# Patient Record
Sex: Male | Born: 1985 | Race: Black or African American | Hispanic: No | Marital: Single | Smoking: Current every day smoker
Health system: Southern US, Community
[De-identification: ages and names within clinical notes are randomized; demographics above are authoritative.]

## PROBLEM LIST (undated history)

## (undated) ENCOUNTER — Emergency Department (HOSPITAL_COMMUNITY): Admission: EM | Disposition: A | Discharge status: Home / Self Care | Payer: Medicaid Other

## (undated) DIAGNOSIS — F419 Anxiety disorder, unspecified: Secondary | ICD-10-CM

## (undated) DIAGNOSIS — K219 Gastro-esophageal reflux disease without esophagitis: Secondary | ICD-10-CM

## (undated) DIAGNOSIS — F319 Bipolar disorder, unspecified: Secondary | ICD-10-CM

## (undated) DIAGNOSIS — F23 Brief psychotic disorder: Secondary | ICD-10-CM

## (undated) DIAGNOSIS — R569 Unspecified convulsions: Secondary | ICD-10-CM

## (undated) HISTORY — PX: ABDOMINAL SURGERY: SHX537

---

## 2012-06-23 ENCOUNTER — Emergency Department: Payer: Self-pay | Admitting: Emergency Medicine

## 2012-06-23 LAB — CBC
MCHC: 31.7 g/dL — ABNORMAL LOW (ref 32.0–36.0)
Platelet: 242 10*3/uL (ref 150–440)
RBC: 4.42 10*6/uL (ref 4.40–5.90)
RDW: 12.3 % (ref 11.5–14.5)

## 2012-06-23 LAB — DRUG SCREEN, URINE
Amphetamines, Ur Screen: NEGATIVE (ref ?–1000)
Benzodiazepine, Ur Scrn: NEGATIVE (ref ?–200)
Cannabinoid 50 Ng, Ur ~~LOC~~: NEGATIVE (ref ?–50)
MDMA (Ecstasy)Ur Screen: NEGATIVE (ref ?–500)
Methadone, Ur Screen: NEGATIVE (ref ?–300)
Opiate, Ur Screen: NEGATIVE (ref ?–300)
Phencyclidine (PCP) Ur S: NEGATIVE (ref ?–25)

## 2012-06-23 LAB — COMPREHENSIVE METABOLIC PANEL
Alkaline Phosphatase: 61 U/L (ref 50–136)
BUN: 9 mg/dL (ref 7–18)
Chloride: 113 mmol/L — ABNORMAL HIGH (ref 98–107)
Co2: 30 mmol/L (ref 21–32)
Creatinine: 0.87 mg/dL (ref 0.60–1.30)
EGFR (African American): >60
Glucose: 88 mg/dL (ref 65–99)
Osmolality: 292 (ref 275–301)
Sodium: 148 mmol/L — ABNORMAL HIGH (ref 136–145)

## 2012-06-23 LAB — URINALYSIS, COMPLETE
Blood: NEGATIVE
Glucose,UR: NEGATIVE mg/dL (ref 0–75)
Ketone: NEGATIVE
Leukocyte Esterase: NEGATIVE
Ph: 8 (ref 4.5–8.0)
Protein: NEGATIVE
RBC,UR: 1 /HPF (ref 0–5)
Squamous Epithelial: NONE SEEN
WBC UR: 1 /HPF (ref 0–5)

## 2012-06-23 LAB — TSH: Thyroid Stimulating Horm: 3.48 u[IU]/mL

## 2012-06-23 LAB — ETHANOL
Ethanol %: <0.003 % (ref 0.000–0.080)
Ethanol: <3 mg/dL

## 2012-06-25 ENCOUNTER — Emergency Department: Payer: Self-pay

## 2012-06-25 LAB — VALPROIC ACID LEVEL: Valproic Acid: 55 ug/mL

## 2012-06-25 LAB — URINALYSIS, COMPLETE
Bilirubin,UR: NEGATIVE
Blood: NEGATIVE
Glucose,UR: NEGATIVE mg/dL (ref 0–75)
Leukocyte Esterase: NEGATIVE
Nitrite: NEGATIVE
Ph: 6 (ref 4.5–8.0)
Protein: NEGATIVE
Specific Gravity: 1.02 (ref 1.003–1.030)

## 2012-06-25 LAB — COMPREHENSIVE METABOLIC PANEL
Albumin: 3.8 g/dL (ref 3.4–5.0)
Alkaline Phosphatase: 60 U/L (ref 50–136)
Anion Gap: 5 — ABNORMAL LOW (ref 7–16)
BUN: 9 mg/dL (ref 7–18)
Calcium, Total: 8.5 mg/dL (ref 8.5–10.1)
Chloride: 113 mmol/L — ABNORMAL HIGH (ref 98–107)
Co2: 30 mmol/L (ref 21–32)
Creatinine: 0.84 mg/dL (ref 0.60–1.30)
EGFR (Non-African Amer.): >60
Glucose: 83 mg/dL (ref 65–99)
Osmolality: 292 (ref 275–301)
Potassium: 3.7 mmol/L (ref 3.5–5.1)
SGOT(AST): 27 U/L (ref 15–37)
SGPT (ALT): 27 U/L (ref 12–78)
Total Protein: 7.3 g/dL (ref 6.4–8.2)

## 2012-06-25 LAB — DRUG SCREEN, URINE
Benzodiazepine, Ur Scrn: NEGATIVE (ref ?–200)
Cannabinoid 50 Ng, Ur ~~LOC~~: NEGATIVE (ref ?–50)
Cocaine Metabolite,Ur ~~LOC~~: NEGATIVE (ref ?–300)
MDMA (Ecstasy)Ur Screen: NEGATIVE (ref ?–500)
Methadone, Ur Screen: NEGATIVE (ref ?–300)
Opiate, Ur Screen: NEGATIVE (ref ?–300)
Phencyclidine (PCP) Ur S: NEGATIVE (ref ?–25)

## 2012-06-25 LAB — CBC
HGB: 13.5 g/dL (ref 13.0–18.0)
MCH: 29.8 pg (ref 26.0–34.0)
MCV: 92 fL (ref 80–100)
Platelet: 254 10*3/uL (ref 150–440)
RDW: 12.5 % (ref 11.5–14.5)
WBC: 7.8 10*3/uL (ref 3.8–10.6)

## 2012-06-25 LAB — ETHANOL: Ethanol %: <0.003 % (ref 0.000–0.080)

## 2012-06-25 LAB — TSH: Thyroid Stimulating Horm: 7.21 u[IU]/mL — ABNORMAL HIGH

## 2012-07-06 LAB — CBC
HCT: 42.2 % (ref 40.0–52.0)
HGB: 13.6 g/dL (ref 13.0–18.0)
MCH: 29.4 pg (ref 26.0–34.0)
MCHC: 32.2 g/dL (ref 32.0–36.0)
MCV: 91 fL (ref 80–100)
Platelet: 245 10*3/uL (ref 150–440)
RDW: 12.6 % (ref 11.5–14.5)

## 2012-07-06 LAB — DRUG SCREEN, URINE
Amphetamines, Ur Screen: NEGATIVE (ref ?–1000)
Benzodiazepine, Ur Scrn: NEGATIVE (ref ?–200)
Cocaine Metabolite,Ur ~~LOC~~: NEGATIVE (ref ?–300)
Methadone, Ur Screen: NEGATIVE (ref ?–300)
Opiate, Ur Screen: NEGATIVE (ref ?–300)
Tricyclic, Ur Screen: NEGATIVE (ref ?–1000)

## 2012-07-06 LAB — COMPREHENSIVE METABOLIC PANEL
Albumin: 3.6 g/dL (ref 3.4–5.0)
Alkaline Phosphatase: 63 U/L (ref 50–136)
Anion Gap: 4 — ABNORMAL LOW (ref 7–16)
BUN: 7 mg/dL (ref 7–18)
Calcium, Total: 9 mg/dL (ref 8.5–10.1)
Chloride: 111 mmol/L — ABNORMAL HIGH (ref 98–107)
Co2: 29 mmol/L (ref 21–32)
Creatinine: 0.85 mg/dL (ref 0.60–1.30)
EGFR (African American): >60
EGFR (Non-African Amer.): >60
Glucose: 101 mg/dL — ABNORMAL HIGH (ref 65–99)
Potassium: 3.5 mmol/L (ref 3.5–5.1)
SGOT(AST): 22 U/L (ref 15–37)
SGPT (ALT): 24 U/L (ref 12–78)
Sodium: 144 mmol/L (ref 136–145)
Total Protein: 7.7 g/dL (ref 6.4–8.2)

## 2012-07-06 LAB — URINALYSIS, COMPLETE
Bacteria: NONE SEEN
Blood: NEGATIVE
Glucose,UR: NEGATIVE mg/dL (ref 0–75)
Ketone: NEGATIVE
Leukocyte Esterase: NEGATIVE
Ph: 7 (ref 4.5–8.0)
Protein: NEGATIVE
RBC,UR: <1 /HPF (ref 0–5)
WBC UR: 1 /HPF (ref 0–5)

## 2012-07-06 LAB — ETHANOL: Ethanol: <3 mg/dL

## 2012-07-06 LAB — TSH: Thyroid Stimulating Horm: 5.28 u[IU]/mL — ABNORMAL HIGH

## 2012-07-09 ENCOUNTER — Inpatient Hospital Stay: Payer: Self-pay | Admitting: Psychiatry

## 2012-07-09 LAB — VALPROIC ACID LEVEL: Valproic Acid: 80 ug/mL

## 2012-07-09 LAB — LITHIUM LEVEL: Lithium: 0.99 mmol/L

## 2012-07-31 ENCOUNTER — Emergency Department: Payer: Self-pay | Admitting: Emergency Medicine

## 2012-07-31 LAB — COMPREHENSIVE METABOLIC PANEL
Alkaline Phosphatase: 55 U/L (ref 50–136)
Anion Gap: 7 (ref 7–16)
BUN: 13 mg/dL (ref 7–18)
Calcium, Total: 8.7 mg/dL (ref 8.5–10.1)
Chloride: 111 mmol/L — ABNORMAL HIGH (ref 98–107)
Creatinine: 0.92 mg/dL (ref 0.60–1.30)
EGFR (Non-African Amer.): >60
Glucose: 87 mg/dL (ref 65–99)
Osmolality: 290 (ref 275–301)
Potassium: 3.4 mmol/L — ABNORMAL LOW (ref 3.5–5.1)
SGOT(AST): 23 U/L (ref 15–37)
Total Protein: 7.3 g/dL (ref 6.4–8.2)

## 2012-07-31 LAB — SALICYLATE LEVEL: Salicylates, Serum: <1.7 mg/dL

## 2012-07-31 LAB — CBC
Platelet: 207 10*3/uL (ref 150–440)
RDW: 12.8 % (ref 11.5–14.5)
WBC: 6.2 10*3/uL (ref 3.8–10.6)

## 2012-07-31 LAB — DRUG SCREEN, URINE
Barbiturates, Ur Screen: NEGATIVE (ref ?–200)
Cannabinoid 50 Ng, Ur ~~LOC~~: NEGATIVE (ref ?–50)
Methadone, Ur Screen: NEGATIVE (ref ?–300)
Opiate, Ur Screen: NEGATIVE (ref ?–300)

## 2012-07-31 LAB — URINALYSIS, COMPLETE
Bacteria: NONE SEEN
Bilirubin,UR: NEGATIVE
Blood: NEGATIVE
Glucose,UR: NEGATIVE mg/dL (ref 0–75)
Nitrite: NEGATIVE
Specific Gravity: 1.02 (ref 1.003–1.030)

## 2012-07-31 LAB — ETHANOL
Ethanol %: <0.003 % (ref 0.000–0.080)
Ethanol: <3 mg/dL

## 2012-07-31 LAB — ACETAMINOPHEN LEVEL: Acetaminophen: <2 ug/mL

## 2012-08-03 LAB — BASIC METABOLIC PANEL
Anion Gap: 6 — ABNORMAL LOW (ref 7–16)
BUN: 9 mg/dL (ref 7–18)
Chloride: 107 mmol/L (ref 98–107)
Co2: 29 mmol/L (ref 21–32)
EGFR (Non-African Amer.): >60
Glucose: 82 mg/dL (ref 65–99)
Potassium: 3.8 mmol/L (ref 3.5–5.1)
Sodium: 142 mmol/L (ref 136–145)

## 2012-08-03 LAB — TSH: Thyroid Stimulating Horm: 4.02 u[IU]/mL

## 2012-08-13 LAB — LITHIUM LEVEL: Lithium: 0.78 mmol/L

## 2012-08-13 LAB — VALPROIC ACID LEVEL: Valproic Acid: 85 ug/mL

## 2012-11-17 ENCOUNTER — Emergency Department (HOSPITAL_COMMUNITY)
Admission: EM | Admit: 2012-11-17 | Discharge: 2012-11-18 | Disposition: A | Discharge status: Home / Self Care | Payer: 59 | Attending: Emergency Medicine | Admitting: Emergency Medicine

## 2012-11-17 ENCOUNTER — Encounter (HOSPITAL_COMMUNITY): Payer: Self-pay | Admitting: *Deleted

## 2012-11-17 DIAGNOSIS — F319 Bipolar disorder, unspecified: Secondary | ICD-10-CM | POA: Insufficient documentation

## 2012-11-17 DIAGNOSIS — F17200 Nicotine dependence, unspecified, uncomplicated: Secondary | ICD-10-CM | POA: Insufficient documentation

## 2012-11-17 DIAGNOSIS — Z79899 Other long term (current) drug therapy: Secondary | ICD-10-CM | POA: Insufficient documentation

## 2012-11-17 DIAGNOSIS — F209 Schizophrenia, unspecified: Secondary | ICD-10-CM | POA: Insufficient documentation

## 2012-11-17 HISTORY — DX: Bipolar disorder, unspecified: F31.9

## 2012-11-17 HISTORY — DX: Brief psychotic disorder: F23

## 2012-11-17 LAB — SALICYLATE LEVEL: Salicylate Lvl: <2 mg/dL — ABNORMAL LOW (ref 2.8–20.0)

## 2012-11-17 LAB — COMPREHENSIVE METABOLIC PANEL
ALT: 13 U/L (ref 0–53)
AST: 21 U/L (ref 0–37)
Albumin: 3.7 g/dL (ref 3.5–5.2)
Alkaline Phosphatase: 48 U/L (ref 39–117)
CO2: 25 mEq/L (ref 19–32)
Chloride: 107 mEq/L (ref 96–112)
GFR calc non Af Amer: >90 mL/min (ref >90)
Potassium: 3.4 mEq/L — ABNORMAL LOW (ref 3.5–5.1)
Sodium: 142 mEq/L (ref 135–145)
Total Bilirubin: 0.2 mg/dL — ABNORMAL LOW (ref 0.3–1.2)

## 2012-11-17 LAB — RAPID URINE DRUG SCREEN, HOSP PERFORMED
Amphetamines: NOT DETECTED
Barbiturates: NOT DETECTED
Benzodiazepines: NOT DETECTED
Tetrahydrocannabinol: NOT DETECTED

## 2012-11-17 LAB — ACETAMINOPHEN LEVEL: Acetaminophen (Tylenol), Serum: <15 ug/mL (ref 10–30)

## 2012-11-17 LAB — CBC
Platelets: 202 10*3/uL (ref 150–400)
RBC: 4.19 MIL/uL — ABNORMAL LOW (ref 4.22–5.81)
RDW: 13.2 % (ref 11.5–15.5)
WBC: 6.4 10*3/uL (ref 4.0–10.5)

## 2012-11-17 MED ORDER — LITHIUM CARBONATE 300 MG PO CAPS
600.0000 mg | ORAL_CAPSULE | Freq: Two times a day (BID) | ORAL | Status: DC
  Administered 2012-11-18: 600 mg via ORAL
  Filled 2012-11-17: qty 2

## 2012-11-17 MED ORDER — LORAZEPAM 1 MG PO TABS
2.0000 mg | ORAL_TABLET | Freq: Four times a day (QID) | ORAL | Status: DC | PRN
  Administered 2012-11-17: 2 mg via ORAL
  Filled 2012-11-17: qty 2

## 2012-11-17 MED ORDER — OLANZAPINE 5 MG PO TABS
10.0000 mg | ORAL_TABLET | Freq: Every morning | ORAL | Status: DC
  Administered 2012-11-18: 10 mg via ORAL
  Filled 2012-11-17: qty 2

## 2012-11-17 MED ORDER — DIVALPROEX SODIUM 500 MG PO DR TAB
1000.0000 mg | DELAYED_RELEASE_TABLET | Freq: Two times a day (BID) | ORAL | Status: DC
  Administered 2012-11-17 – 2012-11-18 (×2): 1000 mg via ORAL
  Filled 2012-11-17: qty 2
  Filled 2012-11-17: qty 4

## 2012-11-17 MED ORDER — CLONAZEPAM 1 MG PO TABS: 1.0000 mg | ORAL_TABLET | Freq: Two times a day (BID) | ORAL | Status: DC | PRN

## 2012-11-17 MED ORDER — LEVOTHYROXINE SODIUM 25 MCG PO TABS
25.0000 ug | ORAL_TABLET | Freq: Every day | ORAL | Status: DC
  Administered 2012-11-18: 25 ug via ORAL
  Filled 2012-11-17 (×2): qty 1

## 2012-11-17 MED ORDER — BENZTROPINE MESYLATE 1 MG PO TABS
2.0000 mg | ORAL_TABLET | Freq: Two times a day (BID) | ORAL | Status: DC
  Administered 2012-11-17 – 2012-11-18 (×2): 2 mg via ORAL
  Filled 2012-11-17 (×2): qty 2

## 2012-11-17 MED ORDER — OLANZAPINE 5 MG PO TABS
20.0000 mg | ORAL_TABLET | Freq: Every day | ORAL | Status: DC
  Administered 2012-11-17: 20 mg via ORAL
  Filled 2012-11-17: qty 4

## 2012-11-17 NOTE — ED Notes (Signed)
2330  Pt is released by tele-psych and is not wanted to come back to the group home as the other people there do not want him there as they are afraid of him.  Spoke to the counselor and he said he will try and come and get him but it will be a while.  Pt is his own legal guardian and has the right to be released on his own merritt.  Pt is requesting his clothes so he can go outside and smoke.  Pt states he will wait for Brett Canales in lobby.  Pt is very determined to leave and we have no reason to hold him any longer as he states he will not stay here tonight and be picked up in the morning.
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
## 2012-11-17 NOTE — ED Provider Notes (Addendum)
CSN: [id number]     Arrival date & time 11/17/12  1959 History     First MD Initiated Contact with Patient 11/17/12 2106     Chief Complaint  Patient presents with  . Medical Clearance   (Consider location/radiation/quality/duration/timing/severity/associated sxs/prior Treatment) HPI Comments: Comes to the ER for evaluation of hallucinations. Patient has a history of schizophrenia. He reports that he has been hearing increased command hallucinations. Hallucinations are telling him to kill people and to kill himself. Patient is concerned that he might harm himself or others. He has been hospitalized for this before.   Past Medical History  Diagnosis Date  . Bipolar 1 disorder   . Schizophrenia, acute    History reviewed. No pertinent past surgical history. History reviewed. No pertinent family history. History  Substance Use Topics  . Smoking status: Not on file  . Smokeless tobacco: Not on file  . Alcohol Use: Not on file    Review of Systems  Psychiatric/Behavioral: Positive for hallucinations and dysphoric mood.  All other systems reviewed and are negative.    Allergies  Benadryl  Home Medications   Current Outpatient Rx  Name  Route  Sig  Dispense  Refill  . acetaminophen (TYLENOL) 650 MG CR tablet   Oral   Take 650 mg by mouth every 6 (six) hours as needed for pain.         . benztropine (COGENTIN) 2 MG tablet   Oral   Take 2 mg by mouth 2 (two) times daily.         . clonazePAM (KLONOPIN) 1 MG tablet   Oral   Take 1 mg by mouth 2 (two) times daily as needed for anxiety.         . divalproex (DEPAKOTE) 500 MG DR tablet   Oral   Take 1,000 mg by mouth 2 (two) times daily.         [name] levothyroxine (SYNTHROID, LEVOTHROID) 25 MCG tablet   Oral   Take 25 mcg by mouth daily before breakfast.         . lithium 600 MG capsule   Oral   Take 600 mg by mouth 2 (two) times daily with a meal.         . LORazepam (ATIVAN) 2 MG tablet   Oral   Take 2  mg by mouth every 6 (six) hours as needed for anxiety.         [name] OLANZapine (ZYPREXA) 10 MG tablet   Oral   Take 10 mg by mouth every morning.         [name] OLANZapine (ZYPREXA) 20 MG tablet   Oral   Take 20 mg by mouth at bedtime.         [name] OLANZapine (ZYPREXA) 5 MG tablet   Oral   Take 5 mg by mouth every 6 (six) hours as needed (for bipolar disorder).          BP 148/74  Pulse 96  Temp(Src) 98 F (36.7 C) (Oral)  Resp 20  SpO2 100% Physical Exam  Constitutional: He is oriented to person, place, and time. He appears well-developed and well-nourished. No distress.  HENT:  Head: Normocephalic and atraumatic.  Right Ear: Hearing normal.  Left Ear: Hearing normal.  Nose: Nose normal.  Mouth/Throat: Oropharynx is clear and moist and mucous membranes are normal.  Eyes: Conjunctivae and EOM are normal. Pupils are equal, round, and reactive to light.  Neck: Normal range of motion. Neck supple.  Cardiovascular: Regular rhythm, S1 normal and S2 normal.  Exam reveals no gallop and no friction rub.   No murmur heard. Pulmonary/Chest: Effort normal and breath sounds normal. No respiratory distress. He exhibits no tenderness.  Abdominal: Soft. Normal appearance and bowel sounds are normal. There is no hepatosplenomegaly. There is no tenderness. There is no rebound, no guarding, no tenderness at McBurney's point and negative Murphy's sign. No hernia.  Musculoskeletal: Normal range of motion.  Neurological: He is alert and oriented to person, place, and time. He has normal strength. No cranial nerve deficit or sensory deficit. Coordination normal. GCS eye subscore is 4. GCS verbal subscore is 5. GCS motor subscore is 6.  Skin: Skin is warm, dry and intact. No rash noted. No cyanosis.  Psychiatric: His speech is delayed. He is slowed. He exhibits a depressed mood. He expresses homicidal and suicidal ideation.    ED Course   Procedures (including critical care time)  Labs Reviewed   CBC - Abnormal; Notable for the following:    RBC 4.19 (*)    Hemoglobin 12.5 (*)    All other components within normal limits  COMPREHENSIVE METABOLIC PANEL - Abnormal; Notable for the following:    Potassium 3.4 (*)    Glucose, Bld 108 (*)    Total Bilirubin 0.2 (*)    All other components within normal limits  SALICYLATE LEVEL - Abnormal; Notable for the following:    Salicylate Lvl <2.0 (*)    All other components within normal limits  ACETAMINOPHEN LEVEL  [name]  URINE RAPID DRUG SCREEN (HOSP PERFORMED)   No results found.  Diagnosis: 1. Psychosis 2. Depression  MDM  Presents to the ER for evaluation of increased hallucinations. Patient is currently living in a group home, having difficulty in his living environment. He is taking his medications. He has a history of bipolar as well as schizophrenia. Bloodwork was unremarkable. Patient is medically clear for psychiatric evaluation and treatment.  Addendum: Has been evaluated by Dr. Jacky Kindle, psychiatry. Although he is hallucinating, he is not felt to be a threat and has been cleared for discharge and outpatient followup.  Gilda Crease, MD 11/17/12 2112  Gilda Crease, MD 11/17/12 [phone]

## 2012-11-18 MED ORDER — LORAZEPAM 1 MG PO TABS: 1.0000 mg | ORAL_TABLET | Freq: Three times a day (TID) | ORAL | Status: DC | PRN

## 2012-11-18 NOTE — ED Notes (Signed)
Had accident in hall w/diarrhea. In BR taking shower and changing scrubs
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
## 2012-11-18 NOTE — ED Notes (Signed)
Pt to be transported via Security with a Psychologist, sport and exercise to [employer]. Pt changed into paper scrub top and his own sweatpants r/t scrub pants not fitting. Pt has been wanded by security while wearing sweatpants.
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
## 2012-11-18 NOTE — BH Assessment (Signed)
[facility] Assessment Progress Note   Received call from patient's guardian Brett Canales #[phone]. Says that he is ready to pick patient up "if he is ready to be discharged". Writer explained that per report received this morning patient has been appropriate for discharge since yesterday 11/17/2012. Brett Canales sts that he will be here to pick patient up asap and he is in route to the ER.

## 2012-11-18 NOTE — Progress Notes (Signed)
Pt without pcp No medicaid card scanned in EPIC Cm attempted to call the [facility] [phone] (PER GOGGLE) number to find out who pcp might be but the number is disconnected Cm called Enoch,Steven Legal Guardian [phone] but unable to allow CM to leave a voice message (mail box not set up)
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
## 2012-11-18 NOTE — ED Provider Notes (Signed)
Act team indicates pts group home staff coming to pick up patient to return there.  Pt content, alert, nad, vitals normal, stable for d/c back to group home.    Suzi Roots, MD 11/18/12 [phone]

## 2012-11-18 NOTE — ED Notes (Signed)
Dr. Opitz at bedside. 
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
## 2012-11-18 NOTE — ED Notes (Signed)
Security Paged for transport

## 2012-11-19 ENCOUNTER — Emergency Department (HOSPITAL_COMMUNITY)
Admission: EM | Admit: 2012-11-19 | Discharge: 2012-11-20 | Disposition: A | Discharge status: Home / Self Care | Payer: Medicaid Other | Attending: Emergency Medicine | Admitting: Emergency Medicine

## 2012-11-19 DIAGNOSIS — F319 Bipolar disorder, unspecified: Secondary | ICD-10-CM | POA: Insufficient documentation

## 2012-11-19 DIAGNOSIS — R45851 Suicidal ideations: Secondary | ICD-10-CM | POA: Insufficient documentation

## 2012-11-19 DIAGNOSIS — R443 Hallucinations, unspecified: Secondary | ICD-10-CM | POA: Insufficient documentation

## 2012-11-19 DIAGNOSIS — F3289 Other specified depressive episodes: Secondary | ICD-10-CM | POA: Insufficient documentation

## 2012-11-19 DIAGNOSIS — Z79899 Other long term (current) drug therapy: Secondary | ICD-10-CM | POA: Insufficient documentation

## 2012-11-19 DIAGNOSIS — F2089 Other schizophrenia: Secondary | ICD-10-CM | POA: Insufficient documentation

## 2012-11-19 DIAGNOSIS — F329 Major depressive disorder, single episode, unspecified: Secondary | ICD-10-CM | POA: Insufficient documentation

## 2012-11-19 DIAGNOSIS — F250 Schizoaffective disorder, bipolar type: Secondary | ICD-10-CM | POA: Diagnosis present

## 2012-11-19 DIAGNOSIS — R569 Unspecified convulsions: Secondary | ICD-10-CM | POA: Insufficient documentation

## 2012-11-19 DIAGNOSIS — F39 Unspecified mood [affective] disorder: Secondary | ICD-10-CM | POA: Insufficient documentation

## 2012-11-20 ENCOUNTER — Encounter (HOSPITAL_COMMUNITY): Payer: Self-pay

## 2012-11-20 ENCOUNTER — Emergency Department: Payer: Self-pay | Admitting: Emergency Medicine

## 2012-11-20 DIAGNOSIS — F259 Schizoaffective disorder, unspecified: Secondary | ICD-10-CM

## 2012-11-20 DIAGNOSIS — F250 Schizoaffective disorder, bipolar type: Secondary | ICD-10-CM | POA: Diagnosis present

## 2012-11-20 DIAGNOSIS — F29 Unspecified psychosis not due to a substance or known physiological condition: Secondary | ICD-10-CM

## 2012-11-20 LAB — DRUG SCREEN, URINE
Amphetamines, Ur Screen: NEGATIVE (ref ?–1000)
Barbiturates, Ur Screen: NEGATIVE (ref ?–200)
Cannabinoid 50 Ng, Ur ~~LOC~~: NEGATIVE (ref ?–50)
Cocaine Metabolite,Ur ~~LOC~~: NEGATIVE (ref ?–300)
MDMA (Ecstasy)Ur Screen: NEGATIVE (ref ?–500)
Phencyclidine (PCP) Ur S: NEGATIVE (ref ?–25)

## 2012-11-20 LAB — COMPREHENSIVE METABOLIC PANEL
Albumin: 3.4 g/dL — ABNORMAL LOW (ref 3.5–5.2)
Albumin: 3.6 g/dL (ref 3.4–5.0)
Anion Gap: 2 — ABNORMAL LOW (ref 7–16)
BUN: 8 mg/dL (ref 6–23)
BUN: 8 mg/dL (ref 7–18)
CO2: 29 mEq/L (ref 19–32)
Chloride: 105 mEq/L (ref 96–112)
Chloride: 109 mmol/L — ABNORMAL HIGH (ref 98–107)
Co2: 32 mmol/L (ref 21–32)
Creatinine, Ser: 0.88 mg/dL (ref 0.50–1.35)
Creatinine: 0.89 mg/dL (ref 0.60–1.30)
EGFR (African American): >60
GFR calc Af Amer: >90 mL/min (ref >90)
GFR calc non Af Amer: >90 mL/min (ref >90)
Glucose, Bld: 110 mg/dL — ABNORMAL HIGH (ref 70–99)
Glucose: 83 mg/dL (ref 65–99)
Osmolality: 282 (ref 275–301)
Potassium: 3.8 mmol/L (ref 3.5–5.1)
SGOT(AST): 25 U/L (ref 15–37)
Sodium: 143 mmol/L (ref 136–145)
Total Bilirubin: 0.3 mg/dL (ref 0.3–1.2)
Total Protein: 7.3 g/dL (ref 6.4–8.2)

## 2012-11-20 LAB — LITHIUM LEVEL: Lithium Lvl: 1.26 mEq/L (ref 0.80–1.40)

## 2012-11-20 LAB — CBC
HCT: 37.1 % — ABNORMAL LOW (ref 39.0–52.0)
HGB: 13.6 g/dL (ref 13.0–18.0)
Hemoglobin: 11.9 g/dL — ABNORMAL LOW (ref 13.0–17.0)
MCV: 94.2 fL (ref 78.0–100.0)
MCV: 91 fL (ref 80–100)
Platelet: 192 10*3/uL (ref 150–440)
RBC: 4.52 10*6/uL (ref 4.40–5.90)
RDW: 12.9 % (ref 11.5–15.5)
RDW: 13.3 % (ref 11.5–14.5)
WBC: 7.9 10*3/uL (ref 4.0–10.5)
WBC: 6.4 10*3/uL (ref 3.8–10.6)

## 2012-11-20 LAB — URINALYSIS, COMPLETE
Ketone: NEGATIVE
Leukocyte Esterase: NEGATIVE
Nitrite: NEGATIVE
Ph: 9 (ref 4.5–8.0)
Protein: NEGATIVE
Squamous Epithelial: NONE SEEN
WBC UR: <1 /HPF (ref 0–5)

## 2012-11-20 LAB — ETHANOL
Alcohol, Ethyl (B): <11 mg/dL (ref 0–11)
Ethanol %: <0.003 % (ref 0.000–0.080)
Ethanol: <3 mg/dL

## 2012-11-20 LAB — TSH: Thyroid Stimulating Horm: 3.77 u[IU]/mL

## 2012-11-20 LAB — RAPID URINE DRUG SCREEN, HOSP PERFORMED: Opiates: NOT DETECTED

## 2012-11-20 MED ORDER — BENZTROPINE MESYLATE 1 MG PO TABS
2.0000 mg | ORAL_TABLET | Freq: Two times a day (BID) | ORAL | Status: DC
  Administered 2012-11-20: 2 mg via ORAL
  Filled 2012-11-20: qty 2

## 2012-11-20 MED ORDER — OLANZAPINE 5 MG PO TABS
10.0000 mg | ORAL_TABLET | Freq: Every morning | ORAL | Status: DC
  Administered 2012-11-20: 10 mg via ORAL
  Filled 2012-11-20: qty 2

## 2012-11-20 MED ORDER — OLANZAPINE 5 MG PO TABS: 20.0000 mg | ORAL_TABLET | Freq: Every day | ORAL | Status: DC

## 2012-11-20 MED ORDER — DIVALPROEX SODIUM 500 MG PO DR TAB
1000.0000 mg | DELAYED_RELEASE_TABLET | Freq: Two times a day (BID) | ORAL | Status: DC
  Administered 2012-11-20: 1000 mg via ORAL
  Filled 2012-11-20: qty 2

## 2012-11-20 MED ORDER — LITHIUM CARBONATE 300 MG PO CAPS
600.0000 mg | ORAL_CAPSULE | Freq: Two times a day (BID) | ORAL | Status: DC
  Administered 2012-11-20: 600 mg via ORAL
  Filled 2012-11-20: qty 2

## 2012-11-20 MED ORDER — POTASSIUM CHLORIDE CRYS ER 20 MEQ PO TBCR
40.0000 meq | EXTENDED_RELEASE_TABLET | Freq: Once | ORAL | Status: AC
  Administered 2012-11-20: 40 meq via ORAL
  Filled 2012-11-20: qty 2

## 2012-11-20 MED ORDER — OLANZAPINE 5 MG PO TABS: 5.0000 mg | ORAL_TABLET | Freq: Four times a day (QID) | ORAL | Status: DC | PRN

## 2012-11-20 MED ORDER — LEVOTHYROXINE SODIUM 25 MCG PO TABS
25.0000 ug | ORAL_TABLET | Freq: Every day | ORAL | Status: DC
  Administered 2012-11-20: 25 ug via ORAL
  Filled 2012-11-20 (×2): qty 1

## 2012-11-20 MED ORDER — IBUPROFEN 200 MG PO TABS: 600.0000 mg | ORAL_TABLET | Freq: Three times a day (TID) | ORAL | Status: DC | PRN

## 2012-11-20 MED ORDER — CLONAZEPAM 1 MG PO TABS
1.0000 mg | ORAL_TABLET | Freq: Two times a day (BID) | ORAL | Status: DC | PRN
Start: 2012-11-20 — End: 2012-11-20

## 2012-11-20 MED ORDER — ZOLPIDEM TARTRATE 5 MG PO TABS: 5.0000 mg | ORAL_TABLET | Freq: Every evening | ORAL | Status: DC | PRN

## 2012-11-20 MED ORDER — ONDANSETRON HCL 4 MG PO TABS: 4.0000 mg | ORAL_TABLET | Freq: Three times a day (TID) | ORAL | Status: DC | PRN

## 2012-11-20 MED ORDER — LORAZEPAM 1 MG PO TABS
2.0000 mg | ORAL_TABLET | Freq: Four times a day (QID) | ORAL | Status: DC | PRN
Start: 2012-11-20 — End: 2012-11-20

## 2012-11-20 MED ORDER — NICOTINE 21 MG/24HR TD PT24
21.0000 mg | MEDICATED_PATCH | Freq: Every day | TRANSDERMAL | Status: DC
  Filled 2012-11-20: qty 1

## 2012-11-20 NOTE — Progress Notes (Signed)
CSW spoke with Brett Canales at [facility] to pick up patient. Patient caregiver, anticipated patient will arrive to provide transport around 11:30 am  .[name], Theresia Majors  [phone] .11/20/2012 10:19am

## 2012-11-20 NOTE — ED Provider Notes (Signed)
Medical screening examination/treatment/procedure(s) were performed by non-physician practitioner and as supervising physician I was immediately available for consultation/collaboration.  Sunnie Nielsen, MD 11/20/12 [phone]
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
## 2012-11-20 NOTE — ED Provider Notes (Signed)
Psych PA evaluated patient and thought that he is stable to go back to group home. Group home will accept him back.   Richardean Canal, MD 11/20/12 1034

## 2012-11-20 NOTE — ED Notes (Signed)
Pt was here Monday night from [facility] to hold for placement, pt comes tonight from [place] and they state that he is not allowed back at [facility]

## 2012-11-20 NOTE — Consult Note (Signed)
Reviewed  Note and agree  With conclusions and disposition

## 2012-11-20 NOTE — [facility] Counselor (Signed)
Per Dr. Ladona Ridgel pt is psychiatrically cleared. Disposition to be completed by SW & pt to return back to group home

## 2012-11-20 NOTE — Consult Note (Signed)
Reason for Consult: Psychosis Referring Physician: Dr. Carrington Clamp [name] is an 27 y.o. male.  HPI: Patient eating breakfast, polite, engages easily in conversation.  He is calm and cooperative, denies suicidal/homicidal ideations and auditory/visual hallucinations.  [name] requests to go back to his group home and attend a program he is scheduled to go to today.  He is taking his medications and Lithium and Depakote levels are within the normal range.    Past Medical History  Diagnosis Date  . Bipolar 1 disorder   . Schizophrenia, acute     History reviewed. No pertinent past surgical history.  History reviewed. No pertinent family history.  Social History:  has no tobacco, alcohol, and drug history on file.  Allergies:  Allergies  Allergen Reactions  . Benadryl (Diphenhydramine Hcl) Anaphylaxis    Medications: I have reviewed the patient's current medications.  Results for orders placed during the hospital encounter of 11/19/12 (from the past 48 hour(s))  CBC     Status: Abnormal   Collection Time    11/20/12  1:00 AM      Result Value Range   WBC 7.9  4.0 - 10.5 K/uL   RBC 3.94 (*) 4.22 - 5.81 MIL/uL   Hemoglobin 11.9 (*) 13.0 - 17.0 g/dL   HCT 16.1 (*) 09.6 - 04.5 %   MCV 94.2  78.0 - 100.0 fL   MCH 30.2  26.0 - 34.0 pg   MCHC 32.1  30.0 - 36.0 g/dL   RDW 40.9  81.1 - 91.4 %   Platelets 207  150 - 400 K/uL  COMPREHENSIVE METABOLIC PANEL     Status: Abnormal   Collection Time    11/20/12  1:00 AM      Result Value Range   Sodium 142  135 - 145 mEq/L   Potassium 3.1 (*) 3.5 - 5.1 mEq/L   Chloride 105  96 - 112 mEq/L   CO2 29  19 - 32 mEq/L   Glucose, Bld 110 (*) 70 - 99 mg/dL   BUN 8  6 - 23 mg/dL   Creatinine, Ser 7.82  0.50 - 1.35 mg/dL   Calcium 9.2  8.4 - 95.6 mg/dL   Total Protein 6.3  6.0 - 8.3 g/dL   Albumin 3.4 (*) 3.5 - 5.2 g/dL   AST 20  0 - 37 U/L   ALT 10  0 - 53 U/L   Alkaline Phosphatase 44  39 - 117 U/L   Total Bilirubin 0.3  0.3 - 1.2  mg/dL   GFR calc non Af Amer >90  >90 mL/min   GFR calc Af Amer >90  >90 mL/min   Comment:            The eGFR has been calculated     using the CKD EPI equation.     This calculation has not been     validated in all clinical     situations.     eGFR's persistently     <90 mL/min signify     possible Chronic Kidney Disease.  [name]     Status: None   Collection Time    11/20/12  1:00 AM      Result Value Range   Alcohol, Ethyl (B) <11  0 - 11 mg/dL   Comment:            LOWEST DETECTABLE LIMIT FOR     SERUM ALCOHOL IS 11 mg/dL     FOR MEDICAL PURPOSES ONLY  LITHIUM LEVEL     Status: None   Collection Time    11/20/12  1:00 AM      Result Value Range   Lithium Lvl 1.26  0.80 - 1.40 mEq/L  URINE RAPID DRUG SCREEN (HOSP PERFORMED)     Status: None   Collection Time    11/20/12  1:13 AM      Result Value Range   Opiates NONE DETECTED  NONE DETECTED   Cocaine NONE DETECTED  NONE DETECTED   Benzodiazepines NONE DETECTED  NONE DETECTED   Amphetamines NONE DETECTED  NONE DETECTED   Tetrahydrocannabinol NONE DETECTED  NONE DETECTED   Barbiturates NONE DETECTED  NONE DETECTED   Comment:            DRUG SCREEN FOR MEDICAL PURPOSES     ONLY.  IF CONFIRMATION IS NEEDED     FOR ANY PURPOSE, NOTIFY LAB     WITHIN 5 DAYS.                LOWEST DETECTABLE LIMITS     FOR URINE DRUG SCREEN     Drug Class       Cutoff (ng/mL)     Amphetamine      1000     Barbiturate      200     Benzodiazepine   200     Tricyclics       300     Opiates          300     Cocaine          300     THC              50    No results found.  Review of Systems  Constitutional: Negative.   HENT: Negative.   Eyes: Negative.   Respiratory: Negative.   Cardiovascular: Negative.   Gastrointestinal: Negative.   Genitourinary: Negative.   Musculoskeletal: Negative.   Skin: Negative.   Neurological: Negative.   Endo/Heme/Allergies: Negative.   Psychiatric/Behavioral: Negative.    Blood pressure  138/74, pulse 71, temperature 97.6 F (36.4 C), temperature source Oral, resp. rate 20, SpO2 97.00%. Physical Exam Completed by primary MD, reviewed  Family History:  Mother may have mental issues, type not noted.   Mental Status Examination/Evaluation: Patient is dressed appropriately, appears younger than his stated age, good eye contact, alert and oriented times three.  He is calm and cooperative today, denies any active or passive suicidal thoughts or homicidal thoughts.  Mood is euthymic and affect congruent.  His thoughts are organized and answers questions appropriately, goal directed.  There is no paranoia or delusions present at this time.  He denies any auditory or visual hallucination.  His attention and concentration are fair.  His insight and judgment are fair, impulse control intact.  DIAGNOSIS:  AXIS I  Schizoaffective disorder, bipolar type; psychosis  AXIS II  Deferred   AXIS III  None  AXIS IV  other psychosocial or environmental problems, chronic mental illness, problems with primary support group   AXIS V  61-70 mild symptoms    Assessment/Plan:  Recommend continue current psychiatric medications. Patient does not need inpatient psychiatric treatment. Recommend followup treatment with his regular provider and return to his group home  Nanine Means, PMH-NP 11/20/2012, 10:11 AM

## 2012-11-20 NOTE — ED Notes (Addendum)
Pt is awake and alert, pt is hyper-verbal but cooperative. Pt was medication compliant.  Pt is discharge back to the group home via Brett Canales (group home staff)  Discharge and escorted to lobby without incident.saftely monitor and maintain.

## 2012-11-20 NOTE — ED Provider Notes (Signed)
CSN: [id number]     Arrival date & time 11/19/12  2355 History     First MD Initiated Contact with Patient 11/20/12 0025     Chief Complaint  Patient presents with  . Medical Clearance   HPI  History provided by the patient. Patient is a 27 year old male who presents after being seen at [facility] for medical clearance. Patient states that he has continued to have auditory hallucinations and states that he has felt suicidal. He denies any specific plan. He was seen in the [facility] emergency room recently for similar symptoms. It was decided that he could return home to his group home. Patient has not had any improvement however in reports increased depression. He has not been taking his medications as instructed over the past few days. Denies any other drug or alcohol use. No other aggravating or alleviating factors. Pt also reports that he had 3 seizures today while outside.  He reports that his body was shaking.  He states he thinks this was from his medications and the heat.  No prior history of seizures. Denies any LOC. Denies any chest.    Past Medical History  Diagnosis Date  . Bipolar 1 disorder   . Schizophrenia, acute    History reviewed. No pertinent past surgical history. History reviewed. No pertinent family history. History  Substance Use Topics  . Smoking status: Not on file  . Smokeless tobacco: Not on file  . Alcohol Use: Not on file    Review of Systems  Constitutional: Negative for fever and chills.  Psychiatric/Behavioral: Positive for suicidal ideas, hallucinations and dysphoric mood.  All other systems reviewed and are negative.    Allergies  Benadryl  Home Medications   Current Outpatient Rx  Name  Route  Sig  Dispense  Refill  . acetaminophen (TYLENOL) 650 MG CR tablet   Oral   Take 650 mg by mouth every 6 (six) hours as needed for pain.         . benztropine (COGENTIN) 2 MG tablet   Oral   Take 2 mg by mouth 2 (two) times daily.         .  clonazePAM (KLONOPIN) 1 MG tablet   Oral   Take 1 mg by mouth 2 (two) times daily as needed for anxiety.         . divalproex (DEPAKOTE) 500 MG DR tablet   Oral   Take 1,000 mg by mouth 2 (two) times daily.         [name] levothyroxine (SYNTHROID, LEVOTHROID) 25 MCG tablet   Oral   Take 25 mcg by mouth daily before breakfast.         . lithium 600 MG capsule   Oral   Take 600 mg by mouth 2 (two) times daily with a meal.         . LORazepam (ATIVAN) 2 MG tablet   Oral   Take 2 mg by mouth every 6 (six) hours as needed for anxiety.         [name] OLANZapine (ZYPREXA) 10 MG tablet   Oral   Take 10 mg by mouth every morning.         [name] OLANZapine (ZYPREXA) 20 MG tablet   Oral   Take 20 mg by mouth at bedtime.         [name] OLANZapine (ZYPREXA) 5 MG tablet   Oral   Take 5 mg by mouth every 6 (six) hours as needed (for bipolar disorder).  BP 115/64  Pulse 81  Temp(Src) 98.4 F (36.9 C) (Oral)  Resp 20  SpO2 98% Physical Exam  Nursing note and vitals reviewed. Constitutional: He is oriented to person, place, and time. He appears well-developed and well-nourished. No distress.  HENT:  Head: Normocephalic.  Cardiovascular: Normal rate and regular rhythm.   Pulmonary/Chest: Effort normal and breath sounds normal. No respiratory distress. He has no wheezes. He has no rales.  Abdominal: Soft.  Neurological: He is alert and oriented to person, place, and time.  Skin: Skin is warm. No rash noted. No erythema.  Psychiatric: He has a normal mood and affect. He is not actively hallucinating. He expresses suicidal ideation. He expresses no homicidal ideation. He expresses no suicidal plans.    ED Course   Procedures   Results for orders placed during the hospital encounter of 11/19/12  CBC      Result Value Range   WBC 7.9  4.0 - 10.5 K/uL   RBC 3.94 (*) 4.22 - 5.81 MIL/uL   Hemoglobin 11.9 (*) 13.0 - 17.0 g/dL   HCT 16.1 (*) 09.6 - 04.5 %   MCV 94.2  78.0 -  100.0 fL   MCH 30.2  26.0 - 34.0 pg   MCHC 32.1  30.0 - 36.0 g/dL   RDW 40.9  81.1 - 91.4 %   Platelets 207  150 - 400 K/uL  COMPREHENSIVE METABOLIC PANEL      Result Value Range   Sodium 142  135 - 145 mEq/L   Potassium 3.1 (*) 3.5 - 5.1 mEq/L   Chloride 105  96 - 112 mEq/L   CO2 29  19 - 32 mEq/L   Glucose, Bld 110 (*) 70 - 99 mg/dL   BUN 8  6 - 23 mg/dL   Creatinine, Ser 7.82  0.50 - 1.35 mg/dL   Calcium 9.2  8.4 - 95.6 mg/dL   Total Protein 6.3  6.0 - 8.3 g/dL   Albumin 3.4 (*) 3.5 - 5.2 g/dL   AST 20  0 - 37 U/L   ALT 10  0 - 53 U/L   Alkaline Phosphatase 44  39 - 117 U/L   Total Bilirubin 0.3  0.3 - 1.2 mg/dL   GFR calc non Af Amer >90  >90 mL/min   GFR calc Af Amer >90  >90 mL/min  [name]      Result Value Range   Alcohol, Ethyl (B) <11  0 - 11 mg/dL  URINE RAPID DRUG SCREEN (HOSP PERFORMED)      Result Value Range   Opiates NONE DETECTED  NONE DETECTED   Cocaine NONE DETECTED  NONE DETECTED   Benzodiazepines NONE DETECTED  NONE DETECTED   Amphetamines NONE DETECTED  NONE DETECTED   Tetrahydrocannabinol NONE DETECTED  NONE DETECTED   Barbiturates NONE DETECTED  NONE DETECTED  LITHIUM LEVEL      Result Value Range   Lithium Lvl 1.26  0.80 - 1.40 mEq/L      1. Hallucination   2. Suicidal ideation     MDM  Patient seen and evaluated. Patient continued to endorse suicidal ideations. Patient also reports hallucinations.  Patient moved to Psych ED. Holding orders in place. Patient to be evaluated for placement by act team.     Angus Seller, PA-C 11/20/12 [phone]

## 2012-11-20 NOTE — BHH Counselor (Signed)
Pt initially presented to [facility] voluntarily.  Pt came from group home, denies SI/HI, positive for AH. Per [place] from [place], pt is unable to come back to [place] b/c of history of seizures.

## 2012-11-21 ENCOUNTER — Encounter (HOSPITAL_COMMUNITY): Payer: Self-pay | Admitting: *Deleted

## 2012-11-21 ENCOUNTER — Emergency Department (HOSPITAL_COMMUNITY)
Admission: EM | Admit: 2012-11-21 | Discharge: 2012-11-22 | Disposition: A | Discharge status: Home / Self Care | Payer: Medicaid Other | Attending: Emergency Medicine | Admitting: Emergency Medicine

## 2012-11-21 DIAGNOSIS — R1013 Epigastric pain: Secondary | ICD-10-CM | POA: Insufficient documentation

## 2012-11-21 DIAGNOSIS — Z008 Encounter for other general examination: Secondary | ICD-10-CM | POA: Insufficient documentation

## 2012-11-21 DIAGNOSIS — R070 Pain in throat: Secondary | ICD-10-CM | POA: Insufficient documentation

## 2012-11-21 DIAGNOSIS — F319 Bipolar disorder, unspecified: Secondary | ICD-10-CM | POA: Insufficient documentation

## 2012-11-21 DIAGNOSIS — R1110 Vomiting, unspecified: Secondary | ICD-10-CM | POA: Insufficient documentation

## 2012-11-21 DIAGNOSIS — Z79899 Other long term (current) drug therapy: Secondary | ICD-10-CM | POA: Insufficient documentation

## 2012-11-21 DIAGNOSIS — F2089 Other schizophrenia: Secondary | ICD-10-CM | POA: Insufficient documentation

## 2012-11-21 NOTE — ED Notes (Signed)
Per EMS[place]. Initial complaint was abd pain following a large meal of "hot wings and greasy foods". Patient requested to go to mental health. Patient behaving erratically with EMS and in waiting room at this time.

## 2012-11-21 NOTE — ED Notes (Signed)
Patient is alert and oriented x3.  [place] [address] with complaints of abdominal pain after eating  Hot wings.  He also states that he needs help with his psych medications and request BH help.

## 2012-11-22 ENCOUNTER — Encounter (HOSPITAL_COMMUNITY): Payer: Self-pay | Admitting: Emergency Medicine

## 2012-11-22 ENCOUNTER — Emergency Department (HOSPITAL_COMMUNITY)
Admission: EM | Admit: 2012-11-22 | Discharge: 2012-11-23 | Disposition: A | Discharge status: Home / Self Care | Payer: Medicaid Other | Attending: Emergency Medicine | Admitting: Emergency Medicine

## 2012-11-22 DIAGNOSIS — R45850 Homicidal ideations: Secondary | ICD-10-CM

## 2012-11-22 DIAGNOSIS — F2089 Other schizophrenia: Secondary | ICD-10-CM | POA: Insufficient documentation

## 2012-11-22 DIAGNOSIS — F200 Paranoid schizophrenia: Secondary | ICD-10-CM

## 2012-11-22 DIAGNOSIS — R45851 Suicidal ideations: Secondary | ICD-10-CM | POA: Insufficient documentation

## 2012-11-22 DIAGNOSIS — Z79899 Other long term (current) drug therapy: Secondary | ICD-10-CM | POA: Insufficient documentation

## 2012-11-22 DIAGNOSIS — R440 Auditory hallucinations: Secondary | ICD-10-CM

## 2012-11-22 DIAGNOSIS — F17200 Nicotine dependence, unspecified, uncomplicated: Secondary | ICD-10-CM | POA: Insufficient documentation

## 2012-11-22 DIAGNOSIS — F319 Bipolar disorder, unspecified: Secondary | ICD-10-CM | POA: Insufficient documentation

## 2012-11-22 DIAGNOSIS — R443 Hallucinations, unspecified: Secondary | ICD-10-CM | POA: Insufficient documentation

## 2012-11-22 LAB — CBC WITH DIFFERENTIAL/PLATELET
Eosinophils Absolute: 0.2 10*3/uL (ref 0.0–0.7)
Hemoglobin: 12.7 g/dL — ABNORMAL LOW (ref 13.0–17.0)
Lymphocytes Relative: 36 % (ref 12–46)
Lymphs Abs: 2.7 10*3/uL (ref 0.7–4.0)
MCH: 30 pg (ref 26.0–34.0)
Monocytes Relative: 16 % — ABNORMAL HIGH (ref 3–12)
Neutrophils Relative %: 46 % (ref 43–77)
RBC: 4.23 MIL/uL (ref 4.22–5.81)
WBC: 7.4 10*3/uL (ref 4.0–10.5)

## 2012-11-22 LAB — ETHANOL: Alcohol, Ethyl (B): <11 mg/dL (ref 0–11)

## 2012-11-22 LAB — BASIC METABOLIC PANEL
BUN: 6 mg/dL (ref 6–23)
CO2: 31 mEq/L (ref 19–32)
Chloride: 106 mEq/L (ref 96–112)
GFR calc non Af Amer: >90 mL/min (ref >90)
Glucose, Bld: 91 mg/dL (ref 70–99)
Potassium: 3.5 mEq/L (ref 3.5–5.1)
Sodium: 143 mEq/L (ref 135–145)

## 2012-11-22 LAB — RAPID URINE DRUG SCREEN, HOSP PERFORMED
Amphetamines: NOT DETECTED
Barbiturates: NOT DETECTED
Benzodiazepines: NOT DETECTED
Cocaine: NOT DETECTED
Tetrahydrocannabinol: NOT DETECTED

## 2012-11-22 MED ORDER — ONDANSETRON HCL 4 MG PO TABS: 4.0000 mg | ORAL_TABLET | Freq: Three times a day (TID) | ORAL | Status: DC | PRN

## 2012-11-22 MED ORDER — ACETAMINOPHEN 325 MG PO TABS: 650.0000 mg | ORAL_TABLET | ORAL | Status: DC | PRN

## 2012-11-22 MED ORDER — LITHIUM CARBONATE 300 MG PO CAPS
600.0000 mg | ORAL_CAPSULE | Freq: Two times a day (BID) | ORAL | Status: DC
  Administered 2012-11-22 – 2012-11-23 (×4): 600 mg via ORAL
  Filled 2012-11-22 (×4): qty 2

## 2012-11-22 MED ORDER — BENZTROPINE MESYLATE 1 MG PO TABS
2.0000 mg | ORAL_TABLET | Freq: Two times a day (BID) | ORAL | Status: DC
  Administered 2012-11-22 – 2012-11-23 (×3): 2 mg via ORAL
  Filled 2012-11-22 (×3): qty 2

## 2012-11-22 MED ORDER — ALUM & MAG HYDROXIDE-SIMETH 200-200-20 MG/5ML PO SUSP: 30.0000 mL | ORAL | Status: DC | PRN

## 2012-11-22 MED ORDER — OLANZAPINE 5 MG PO TABS
20.0000 mg | ORAL_TABLET | Freq: Every day | ORAL | Status: DC
  Administered 2012-11-22: 20 mg via ORAL
  Filled 2012-11-22: qty 4

## 2012-11-22 MED ORDER — NICOTINE 21 MG/24HR TD PT24: 21.0000 mg | MEDICATED_PATCH | Freq: Every day | TRANSDERMAL | Status: DC | PRN

## 2012-11-22 MED ORDER — OLANZAPINE 5 MG PO TABS
10.0000 mg | ORAL_TABLET | Freq: Every morning | ORAL | Status: DC
  Administered 2012-11-22 – 2012-11-23 (×2): 10 mg via ORAL
  Filled 2012-11-22: qty 1
  Filled 2012-11-22: qty 2

## 2012-11-22 MED ORDER — LEVOTHYROXINE SODIUM 25 MCG PO TABS
25.0000 ug | ORAL_TABLET | Freq: Every day | ORAL | Status: DC
  Administered 2012-11-22 – 2012-11-23 (×2): 25 ug via ORAL
  Filled 2012-11-22 (×3): qty 1

## 2012-11-22 MED ORDER — DIVALPROEX SODIUM 500 MG PO DR TAB
1000.0000 mg | DELAYED_RELEASE_TABLET | Freq: Two times a day (BID) | ORAL | Status: DC
  Administered 2012-11-22 – 2012-11-23 (×3): 1000 mg via ORAL
  Filled 2012-11-22 (×3): qty 2

## 2012-11-22 MED ORDER — CLONAZEPAM 1 MG PO TABS
1.0000 mg | ORAL_TABLET | Freq: Two times a day (BID) | ORAL | Status: DC | PRN
  Administered 2012-11-23: 1 mg via ORAL
  Filled 2012-11-22: qty 1

## 2012-11-22 MED ORDER — ZOLPIDEM TARTRATE 5 MG PO TABS: 5.0000 mg | ORAL_TABLET | Freq: Every evening | ORAL | Status: DC | PRN

## 2012-11-22 MED ORDER — IBUPROFEN 200 MG PO TABS: 600.0000 mg | ORAL_TABLET | Freq: Three times a day (TID) | ORAL | Status: DC | PRN

## 2012-11-22 MED ORDER — OLANZAPINE 5 MG PO TABS: 5.0000 mg | ORAL_TABLET | Freq: Four times a day (QID) | ORAL | Status: DC | PRN

## 2012-11-22 NOTE — ED Provider Notes (Signed)
CSN: [id number]     Arrival date & time 11/21/12  2211 History     First MD Initiated Contact with Patient 11/21/12 2321     Chief Complaint  Patient presents with  . Abdominal Pain  . Emesis  . Psychiatric Evaluation    wants to go to mental health   (Consider location/radiation/quality/duration/timing/severity/associated sxs/prior Treatment) The history is provided by the patient and medical records. No language interpreter was used.    [name] is a 27 y.o. male  with a hx of bipolar 1 disorder, schizophrenia presents to the Emergency Department complaining of gradual, persistent, progressively worsening epigastric pain after eating hot wings about 7pm along with chili cheese fries and curly fries. Associated symptoms include burping and burning in his throat.  Pt drank a Dr pepper which helped and nothing makes it worse.  Pt denies fever, chills, headache, chest pain, SOB, nausea, vomiting, diarrhea, weakness, dizziness, syncope.    Pt also states he takes medications for Bipolar which are managed by Kathlene November and Brett Canales who is the owner of the group home.   Pt states he does not have a doctor to manage his medications but wants a psychiatrist to help with this.     Past Medical History  Diagnosis Date  . Bipolar 1 disorder   . Schizophrenia, acute    History reviewed. No pertinent past surgical history. History reviewed. No pertinent family history. History  Substance Use Topics  . Smoking status: Not on file  . Smokeless tobacco: Not on file  . Alcohol Use: Not on file    Review of Systems  Constitutional: Negative for fever, diaphoresis, appetite change, fatigue and unexpected weight change.  HENT: Negative for mouth sores and neck stiffness.   Eyes: Negative for visual disturbance.  Respiratory: Negative for cough, chest tightness, shortness of breath and wheezing.   Cardiovascular: Negative for chest pain.  Gastrointestinal: Positive for abdominal pain. Negative for  nausea, vomiting, diarrhea and constipation.  Endocrine: Negative for polydipsia, polyphagia and polyuria.  Genitourinary: Negative for dysuria, urgency, frequency and hematuria.  Musculoskeletal: Negative for back pain.  Skin: Negative for rash.  Allergic/Immunologic: Negative for immunocompromised state.  Neurological: Negative for syncope, light-headedness and headaches.  Hematological: Does not bruise/bleed easily.  Psychiatric/Behavioral: Negative for sleep disturbance. The patient is not nervous/anxious.     Allergies  Benadryl  Home Medications   Current Outpatient Rx  Name  Route  Sig  Dispense  Refill  . benztropine (COGENTIN) 2 MG tablet   Oral   Take 2 mg by mouth 2 (two) times daily.         . clonazePAM (KLONOPIN) 1 MG tablet   Oral   Take 1 mg by mouth 2 (two) times daily as needed for anxiety.         . divalproex (DEPAKOTE) 500 MG DR tablet   Oral   Take 1,000 mg by mouth 2 (two) times daily.         [name] levothyroxine (SYNTHROID, LEVOTHROID) 25 MCG tablet   Oral   Take 25 mcg by mouth daily before breakfast.         . lithium 600 MG capsule   Oral   Take 600 mg by mouth 2 (two) times daily with a meal.         . LORazepam (ATIVAN) 2 MG tablet   Oral   Take 2 mg by mouth every 6 (six) hours as needed for anxiety.         [name]  OLANZapine (ZYPREXA) 10 MG tablet   Oral   Take 10 mg by mouth every morning.         [name] OLANZapine (ZYPREXA) 20 MG tablet   Oral   Take 20 mg by mouth at bedtime.          [name] acetaminophen (TYLENOL) 650 MG CR tablet   Oral   Take 650 mg by mouth every 6 (six) hours as needed for pain.         [name] OLANZapine (ZYPREXA) 5 MG tablet   Oral   Take 5 mg by mouth every 6 (six) hours as needed (for bipolar disorder).          BP 125/69  Pulse 81  Temp(Src) 98.7 F (37.1 C) (Oral)  Resp 18  SpO2 98% Physical Exam  Nursing note and vitals reviewed. Constitutional: He is oriented to person, place, and time. He  appears well-developed and well-nourished. No distress.  Awake, alert, nontoxic appearance  HENT:  Head: Normocephalic and atraumatic.  Mouth/Throat: Oropharynx is clear and moist. No oropharyngeal exudate.  Eyes: Conjunctivae are normal. No scleral icterus.  Neck: Normal range of motion. Neck supple.  Cardiovascular: Normal rate, regular rhythm and intact distal pulses.   Pulmonary/Chest: Effort normal and breath sounds normal. No respiratory distress. He has no wheezes.  Abdominal: Soft. Normal appearance and bowel sounds are normal. He exhibits no distension and no mass. There is no tenderness. There is no rigidity, no rebound, no guarding and no CVA tenderness.  Musculoskeletal: Normal range of motion. He exhibits no edema.  Neurological: He is alert and oriented to person, place, and time. He exhibits normal muscle tone. Coordination normal.  Speech is clear and goal oriented Moves extremities without ataxia  Skin: Skin is warm and dry. No rash noted. He is not diaphoretic. No erythema. No pallor.  Psychiatric: He has a normal mood and affect. His speech is normal and behavior is normal. Thought content normal.    ED Course   Procedures (including critical care time)  Labs Reviewed - No data to display No results found. 1. Epigastric pain   2. Bipolar 1 disorder     MDM  Baldomero Lamy presents with abd pain after eating hot wings.  Patient states his pain is resolved at this time. He is eating and drinking in the room without difficulty.  Patient requesting psychiatrist to help prescribe his medications. We'll give to have behavioral health resources.   At this time there does not appear to be any evidence of an acute emergency medical condition and the patient appears stable for discharge with appropriate outpatient follow up.Diagnosis was discussed with patient who verbalizes understanding and is agreeable to discharge. Pt case discussed with Dr. Effie Shy who agrees with my plan.     [name] Loden Laurent, PA-C 11/22/12 [phone]

## 2012-11-22 NOTE — ED Notes (Signed)
Pt seen earlier in ED for abdominal pain, pt states he is now hearing voices and doesn't feel safe going to group home. Pt states the voices are telling him to cut himself and kill people.

## 2012-11-22 NOTE — BH Assessment (Signed)
BHH Assessment Progress Note    Pt came to [facility] [phone] Brett Canales) from [facility].  Had 30 day scripts but when he goes to [facility] for follow up he walks off and will not be evaluated by [facility].    Brett Canales who is Manager will not take pt back at this point.  Pt disrupts milieu and engages in AVH talk and SI consistently.  Has been to ER 4x in 3 weeks.  Pt has only been in Group Home for 3 weeks.  Brett Canales reports he can't keep pt safe or others safe.  Pt won't work care plan.  Staffed with Dr. Lolly Mustache and pt will remain in ED.  SW will need to get involved to locate an appropriate setting for pt.  Pt at this time does not meet criteria as he presents in ED, but collateral from Group Home is compelling enough to warrant continued stay to determine an appropriate dc plan.

## 2012-11-22 NOTE — ED Notes (Signed)
Pt given another pair of pants b/c pt ripped pants.

## 2012-11-22 NOTE — ED Notes (Signed)
Pt aaox3. Pt denies SI/VH.  Pt reports AH that are commanding.  Pt reports voices tell him "to hurt other people."  Pt calm and cooperative.

## 2012-11-22 NOTE — Consult Note (Signed)
Reason for Consult: Hallucinations Referring Physician: [name] is an 27 y.o. male.  HPI: The patient is 27 year old [place] American man who came to the emergency room with complaint of auditory hallucination.  He was in the emergency room earlier and he was discharge however he returned with complaint of stomach pain and then he endorse auditory hallucination.  Patient lives in a group home.  He is limited to provide further information.  Apparently he moved from [place] 2 months ago to a group home.  Patient experiencing auditory hallucinations for a long time.  He does not know who is his psychiatrist.  He's been taking multiple psychotropic medication but claimed that he has not seen psychiatrist in a while.  He admitted inpatient psychiatric treatment at least twice.  Patient endorsed that he hears voices on and off but lately he has noticed increase in intensity.  He did not elaborate the content of hallucination.  He admitted to poor sleep irritability but denies any suicidal thinking.  He endorse multiple somatic pain however he is cleared by emergency physician. Initially he was irritable but later he was calm.  His lithium level is 0.46 and his Depakote level is 66.2.  He is also taking olanzapine and moderate dose .  He is taking Klonopin 1 mg twice a day .  Patient appears sedated and groggy.  In the emergency room he was calm and cooperative.   Patient told he is living in the group home because he could not get along with his brother.  Mental status examination Patient is a young man who is mildly obese but appears to be in his stated age.  He appears sedated and groggy.  He maintained poor eye contact.  His speech is slow.  He has some thought blocking .  He endorse auditory hallucination but could not elaborate the details.  He denies any active or passive suicidal thoughts he endorsed some time paranoia however there were no delusions present at this time.  He is limited  to provide further information.  He has difficulty recalling things.  His attention and concentration is poor.  He is alert and oriented x2.  His insight judgment and impulse control is limited.    Past Medical History  Diagnosis Date  . Bipolar 1 disorder   . Schizophrenia, acute     Past Surgical History  Procedure Laterality Date  . Abdominal surgery      No family history on file.  Social History:  reports that he has been smoking.  He does not have any smokeless tobacco history on file. He reports that he does not drink alcohol or use illicit drugs.  Allergies:  Allergies  Allergen Reactions  . Benadryl (Diphenhydramine Hcl) Anaphylaxis    Medications: I have reviewed the patient's current medications.  Results for orders placed during the hospital encounter of 11/22/12 (from the past 48 hour(s))  CBC WITH DIFFERENTIAL     Status: Abnormal   Collection Time    11/22/12  5:17 AM      Result Value Range   WBC 7.4  4.0 - 10.5 K/uL   RBC 4.23  4.22 - 5.81 MIL/uL   Hemoglobin 12.7 (*) 13.0 - 17.0 g/dL   HCT 40.9  81.1 - 91.4 %   MCV 95.5  78.0 - 100.0 fL   MCH 30.0  26.0 - 34.0 pg   MCHC 31.4  30.0 - 36.0 g/dL   RDW 78.2  95.6 - 21.3 %  Platelets 197  150 - 400 K/uL   Neutrophils Relative % 46  43 - 77 %   Neutro Abs 3.4  1.7 - 7.7 K/uL   Lymphocytes Relative 36  12 - 46 %   Lymphs Abs 2.7  0.7 - 4.0 K/uL   Monocytes Relative 16 (*) 3 - 12 %   Monocytes Absolute 1.2 (*) 0.1 - 1.0 K/uL   Eosinophils Relative 2  0 - 5 %   Eosinophils Absolute 0.2  0.0 - 0.7 K/uL   Basophils Relative 0  0 - 1 %   Basophils Absolute 0.0  0.0 - 0.1 K/uL  BASIC METABOLIC PANEL     Status: None   Collection Time    11/22/12  5:17 AM      Result Value Range   Sodium 143  135 - 145 mEq/L   Potassium 3.5  3.5 - 5.1 mEq/L   Chloride 106  96 - 112 mEq/L   CO2 31  19 - 32 mEq/L   Glucose, Bld 91  70 - 99 mg/dL   BUN 6  6 - 23 mg/dL   Creatinine, Ser 1.61  0.50 - 1.35 mg/dL   Calcium  9.3  8.4 - 09.6 mg/dL   GFR calc non Af Amer >90  >90 mL/min   GFR calc Af Amer >90  >90 mL/min   Comment:            The eGFR has been calculated     using the CKD EPI equation.     This calculation has not been     validated in all clinical     situations.     eGFR's persistently     <90 mL/min signify     possible Chronic Kidney Disease.  [name]     Status: None   Collection Time    11/22/12  5:17 AM      Result Value Range   Alcohol, Ethyl (B) <11  0 - 11 mg/dL   Comment:            LOWEST DETECTABLE LIMIT FOR     SERUM ALCOHOL IS 11 mg/dL     FOR MEDICAL PURPOSES ONLY  URINE RAPID DRUG SCREEN (HOSP PERFORMED)     Status: None   Collection Time    11/22/12  5:18 AM      Result Value Range   Opiates NONE DETECTED  NONE DETECTED   Cocaine NONE DETECTED  NONE DETECTED   Benzodiazepines NONE DETECTED  NONE DETECTED   Amphetamines NONE DETECTED  NONE DETECTED   Tetrahydrocannabinol NONE DETECTED  NONE DETECTED   Barbiturates NONE DETECTED  NONE DETECTED   Comment:            DRUG SCREEN FOR MEDICAL PURPOSES     ONLY.  IF CONFIRMATION IS NEEDED     FOR ANY PURPOSE, NOTIFY LAB     WITHIN 5 DAYS.                LOWEST DETECTABLE LIMITS     FOR URINE DRUG SCREEN     Drug Class       Cutoff (ng/mL)     Amphetamine      1000     Barbiturate      200     Benzodiazepine   200     Tricyclics       300     Opiates          300  Cocaine          300     THC              50  VALPROIC ACID LEVEL     Status: None   Collection Time    11/22/12  5:45 AM      Result Value Range   Valproic Acid Lvl 66.2  50.0 - 100.0 ug/mL  LITHIUM LEVEL     Status: Abnormal   Collection Time    11/22/12  5:45 AM      Result Value Range   Lithium Lvl 0.46 (*) 0.80 - 1.40 mEq/L    No results found.  Review of Systems  Psychiatric/Behavioral: Positive for hallucinations. The patient is nervous/anxious.    Blood pressure 96/62, pulse 72, temperature 97.6 F (36.4 C), temperature source  Oral, resp. rate 16, SpO2 100.00%. Physical Exam  Assessment/Plan:  Axis I paranoid schizophrenia  Plan At this time we have limited information .  We will get collateral information from the group home.  It is unclear who is providing psychotropic medication.  We will monitor his behavior overnight .  Continue current medication.  Follow up in the morning.    [name] T. 11/22/2012, 11:35 AM

## 2012-11-22 NOTE — ED Notes (Addendum)
Up to the bathroom, then ate lunch

## 2012-11-22 NOTE — ED Provider Notes (Signed)
CSN: [id number]     Arrival date & time 11/22/12  0346 History     First MD Initiated Contact with Patient 11/22/12 0457     Chief Complaint  Patient presents with  . Medical Clearance   (Consider location/radiation/quality/duration/timing/severity/associated sxs/prior Treatment) HPI Comments: [name] is a 27 y.o. Male who was brought here for auditory hallucinations and statements of suicide ideation and homicidal ideation. He had been here earlier in the evening with a complaint of stomach distress after eating hot wings. He, states that these thoughts began after he was discharged, earlier. It is in this ED 3 days ago, for hallucinations and suicidal ideation. He was evaluated by psychiatry services and discharged on his usual medications. Patient has been living in [place] for 2 months in a group home. He, states that he has auditory hallucinations and thoughts of suicide every 2 weeks or so. 2 weeks ago he cut his left wrist. He denies headache, weakness, dizziness, nausea, vomiting, chest pain, back, pain, or dizziness. He would like to have his psychiatric medication was changed. There are no other known modifying factors.  The history is provided by the patient.    Past Medical History  Diagnosis Date  . Bipolar 1 disorder   . Schizophrenia, acute    Past Surgical History  Procedure Laterality Date  . Abdominal surgery     No family history on file. History  Substance Use Topics  . Smoking status: Current Every Day Smoker  . Smokeless tobacco: Not on file  . Alcohol Use: No    Review of Systems  All other systems reviewed and are negative.    Allergies  Benadryl  Home Medications   Current Outpatient Rx  Name  Route  Sig  Dispense  Refill  . acetaminophen (TYLENOL) 650 MG CR tablet   Oral   Take 650 mg by mouth every 6 (six) hours as needed for pain.         . benztropine (COGENTIN) 2 MG tablet   Oral   Take 2 mg by mouth 2 (two) times daily.          . clonazePAM (KLONOPIN) 1 MG tablet   Oral   Take 1 mg by mouth 2 (two) times daily as needed for anxiety.         . divalproex (DEPAKOTE) 500 MG DR tablet   Oral   Take 1,000 mg by mouth 2 (two) times daily.         [name] levothyroxine (SYNTHROID, LEVOTHROID) 25 MCG tablet   Oral   Take 25 mcg by mouth daily before breakfast.         . lithium 600 MG capsule   Oral   Take 600 mg by mouth daily as needed (mood).          . LORazepam (ATIVAN) 2 MG tablet   Oral   Take 2 mg by mouth every 6 (six) hours as needed for anxiety.         [name] OLANZapine (ZYPREXA) 10 MG tablet   Oral   Take 10 mg by mouth every morning.         [name] OLANZapine (ZYPREXA) 20 MG tablet   Oral   Take 20 mg by mouth at bedtime.          [name] OLANZapine (ZYPREXA) 5 MG tablet   Oral   Take 5 mg by mouth every 6 (six) hours as needed (for bipolar disorder).  BP 140/75  Pulse 72  Temp(Src) 98.6 F (37 C) (Oral)  Resp 20  SpO2 98% Physical Exam  Nursing note and vitals reviewed. Constitutional: He is oriented to person, place, and time. He appears well-developed and well-nourished.  He is calm, and cooperative  HENT:  Head: Normocephalic and atraumatic.  Right Ear: External ear normal.  Left Ear: External ear normal.  Eyes: Conjunctivae and EOM are normal. Pupils are equal, round, and reactive to light.  Neck: Normal range of motion and phonation normal. Neck supple.  Cardiovascular: Normal rate, regular rhythm, normal heart sounds and intact distal pulses.   Pulmonary/Chest: Effort normal and breath sounds normal. He exhibits no bony tenderness.  Abdominal: Soft. Normal appearance. There is no tenderness.  Musculoskeletal: Normal range of motion.  Neurological: He is alert and oriented to person, place, and time. He has normal strength. No cranial nerve deficit or sensory deficit. He exhibits normal muscle tone. Coordination normal.  Skin: Skin is warm, dry and intact.   Psychiatric: He has a normal mood and affect. His behavior is normal.    ED Course   Procedures (including critical care time)  0520- discussed with ACT; they will initiate psychiatric evaluation later this morning.  Labs Reviewed  CBC WITH DIFFERENTIAL  BASIC METABOLIC PANEL  URINE RAPID DRUG SCREEN (HOSP PERFORMED)  [name]  VALPROIC ACID LEVEL  LITHIUM LEVEL   No results found. 1. Suicidal ideation   2. Homicidal ideation   3. Auditory hallucination     MDM  Chronic psychiatric disease, with recurrent suicidal ideation, and auditory hallucination. He is in a group home setting.  Nursing Notes Reviewed/ Care Coordinated, and agree without changes. Applicable Imaging Reviewed.  Interpretation of Laboratory Data incorporated into ED treatment  Flint Melter, MD 11/22/12 [phone]

## 2012-11-22 NOTE — ED Notes (Addendum)
Dr Lolly Mustache in w/ patient

## 2012-11-22 NOTE — ED Notes (Addendum)
Pt transferred from main ed, presents with SI, hearing voices telling him to shoot himself & kill people.  Pt reports he is seeing demons. Pt with hx of Schizophrenia & Bipolar DO. Pt calm & cooperative at present.

## 2012-11-22 NOTE — BH Assessment (Signed)
[facility] Assessment Progress Note   Received call from patient's guardian Brett Canales #[phone]. Says that he is ready to pick patient up "if he is ready to be discharged". Writer explained that per report received this morning patient has been appropriate for discharge since yesterday 11/17/2012. Brett Canales sts that he will be here to pick patient up asap and he is in route to the ER.    The note above is from a note for pt on 7-28.  ACT left a message with zero identifying info on it and asked Brett Canales to call nurses station.    Pt does not know group home name or number.  Emergency contact and pt contact info is the same.  When ACT called the person denied knowing anyone at [facility], denied being a group home and denied being a guardian.  He also denied being Brett Canales.    ACT looked up [facility] and can not find a number that works.  There is a picture but no number.  Pt has been cleared to be d/c by Dr. Lolly Mustache and Arfeen wants pt to be seen by psychiatrist ASAP which guardian or Group home can arrange.  SW may need to follow up if appropriate care giver cannot be identified or located.    See Dr. Lolly Mustache note or dictation.

## 2012-11-22 NOTE — ED Notes (Signed)
Patient is alert and oriented x3.  He was given DC instructions and follow up visit instructions.  Patient gave verbal understanding.  He was DC ambulatory to lobby to wait for ride home.  V/S stable.  He was not showing any signs of distress on DC

## 2012-11-23 NOTE — ED Notes (Signed)
Up to the desk, calm, cooperative 

## 2012-11-23 NOTE — BHH Counselor (Signed)
Per pt's RN, EDP Gwendolyn Grant in agreement that pt could be d/c as pt doesn't meet IVC criteria and pt wanted to leave. Pt was given cab voucher to go to [facility].  Evette Cristal, [place] Assessment Counselor

## 2012-11-23 NOTE — ED Notes (Signed)
Dr. Walden in to see.  

## 2012-11-23 NOTE — ED Notes (Signed)
Dr walden into see 

## 2012-11-23 NOTE — ED Notes (Signed)
Up to the shower to bath and change scrubs

## 2012-11-23 NOTE — Progress Notes (Signed)
Spoke with [name] at [facility] group home.  [name] stated that [name] has been too  and disruptive for him to take [name] back into the group home; [name]'s behaviors have been occurring daily.  [name] stated that he wants to work with the hospital but that he cannot accept [name] back.  [name] stated that he has long-time staff that are threatening to quit due to [name]'s behavior.  [name] clarified [name]'s legal status and stated that he is NOT [name]'s legal guardian; [name] is his own guardian.  [name] stated that [name] has family in [place] but that [name] has not given permission for anyone to contact them.  CSW thanked [name] for his time.   Met with RN and ACT Team Member, [name].  Per RN, [name] has just quieted down and is resting.  RN stated that [name] feels as though the group home is blaming him for other residents' behaviors and that [name] may not want to go back.  Relayed information obtained from [name]'s group home manager, [name].  [name], ACT Team, stating that he will discuss with [name] d/c options when [name] awakens.  CSW provided [place] with a cab voucher, bus pass and information on area homeless shelters.  Weekday CSW to follow, if need be.  Providence Crosby, LCSWA Clinical Social Work [phone]

## 2012-11-23 NOTE — ED Notes (Signed)
Up to the bathroom 

## 2012-11-23 NOTE — ED Notes (Signed)
Pt increasingly verbal and loud, wanting to leave.  Dr Lolly Mustache aware and will talk to the pt.

## 2012-11-23 NOTE — ED Notes (Signed)
Dr Lolly Mustache in w/ pt.  Pt  Talking loudly

## 2012-11-23 NOTE — ED Notes (Signed)
Laying down, resting [employer]

## 2012-11-23 NOTE — ED Notes (Addendum)
Pt angry, talking loudly, but is not physically aggressive.  Pt is not wanting to stay and talk to social work and reports that he will go to the shelter. Pt reports that he is frustrated w/ the group home,that he is taking his medications and that he is being blamed for things that other people have done. Pt loud, but is not aggressive and  agreed to stay and talk w/ the social worker today. Pt is aware that he can not return to the group home at this time.

## 2012-11-23 NOTE — ED Notes (Addendum)
Up in room eatting supper, singing.  Pt denies si/hi/avh at this time and reports he has decided he wants to leave and go to the shelter.  Pt encouraged to remain here until tomorrow when social work will be in.  Pt declined, and reports he has things to do and wants to leave.  Dr Gwendolyn Grant aware will see.

## 2012-11-23 NOTE — ED Notes (Signed)
Sleeping, easily aroused.  Pt shaking and reports he normally shakes, bu tis also cold.  NAd, blanket given.

## 2012-11-23 NOTE — ED Notes (Signed)
Dr arfeen into see 
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
## 2012-11-23 NOTE — ED Notes (Signed)
Marchelle Folks CSW will see today

## 2012-11-23 NOTE — ED Notes (Signed)
Up in the hall, smiling, laughing, loudly,and is wanting to leave to go back to the group home.  Pt redirected back to his room and asked to talk quieter.  Pt agreed and returned tohis room.

## 2012-11-23 NOTE — ED Notes (Signed)
Dr Lolly Mustache into see
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
## 2012-11-23 NOTE — BH Assessment (Signed)
[facility] Assessment Progress Note   Reviewed with nurse all the SW info given since pt won't awake until after ACT has left.    Pt gets white copy of voucher and ED keeps yellow  Can only go in [place] area  Referrals for [facility] which is open 24/7 must call first  [facility] is in [place] and cab won't go there on voucher  Attempts will be made to keep pt overnight in ED and have SW do a full work up in the AM and locate another group home for pt to live.  Pt does not meet IVC criteria in ED and if pt is insistent he leave then it will be up to MD to determine what he or she is going to do with pt.

## 2012-11-23 NOTE — ED Notes (Signed)
Pt up to the desk and reports he has decided he want's to leave.  Pt reports he has things to do and wants to go to the mall.  Pt reminded that the buses quit running at 1800 and the mall closes at 1900.  Pt argued that that was not true, but reported he would decide about leaving after he eats.

## 2012-11-23 NOTE — BH Assessment (Signed)
[facility] Assessment Progress Note    Dr. Lolly Mustache staffed pt with ACT this AM.  There is some reason to believe his group home can't refuse him back if ED recommends discharge.  ACT is not aware of this policy or law.  SW will examine or look into in the AM.

## 2012-11-24 ENCOUNTER — Emergency Department (HOSPITAL_COMMUNITY)
Admission: EM | Admit: 2012-11-24 | Discharge: 2012-11-25 | Disposition: A | Discharge status: Home / Self Care | Payer: Medicaid Other | Attending: Emergency Medicine | Admitting: Emergency Medicine

## 2012-11-24 ENCOUNTER — Encounter (HOSPITAL_COMMUNITY): Payer: Self-pay

## 2012-11-24 DIAGNOSIS — F250 Schizoaffective disorder, bipolar type: Secondary | ICD-10-CM

## 2012-11-24 DIAGNOSIS — R443 Hallucinations, unspecified: Secondary | ICD-10-CM | POA: Insufficient documentation

## 2012-11-24 DIAGNOSIS — F17200 Nicotine dependence, unspecified, uncomplicated: Secondary | ICD-10-CM | POA: Insufficient documentation

## 2012-11-24 DIAGNOSIS — Z79899 Other long term (current) drug therapy: Secondary | ICD-10-CM | POA: Insufficient documentation

## 2012-11-24 DIAGNOSIS — F259 Schizoaffective disorder, unspecified: Secondary | ICD-10-CM | POA: Insufficient documentation

## 2012-11-24 LAB — COMPREHENSIVE METABOLIC PANEL
Alkaline Phosphatase: 45 U/L (ref 39–117)
BUN: 12 mg/dL (ref 6–23)
Calcium: 9.3 mg/dL (ref 8.4–10.5)
GFR calc Af Amer: >90 mL/min (ref >90)
Glucose, Bld: 89 mg/dL (ref 70–99)
Potassium: 3.7 mEq/L (ref 3.5–5.1)
Total Protein: 6.9 g/dL (ref 6.0–8.3)

## 2012-11-24 LAB — CBC
HCT: 40 % (ref 39.0–52.0)
Hemoglobin: 12.5 g/dL — ABNORMAL LOW (ref 13.0–17.0)
MCH: 29.4 pg (ref 26.0–34.0)
MCHC: 31.3 g/dL (ref 30.0–36.0)

## 2012-11-24 LAB — ETHANOL: Alcohol, Ethyl (B): <11 mg/dL (ref 0–11)

## 2012-11-24 LAB — RAPID URINE DRUG SCREEN, HOSP PERFORMED: Benzodiazepines: NOT DETECTED

## 2012-11-24 MED ORDER — LITHIUM CARBONATE 300 MG PO CAPS: 600.0000 mg | ORAL_CAPSULE | Freq: Every day | ORAL | Status: DC | PRN

## 2012-11-24 MED ORDER — LORAZEPAM 1 MG PO TABS
2.0000 mg | ORAL_TABLET | Freq: Four times a day (QID) | ORAL | Status: DC | PRN
  Administered 2012-11-25: 2 mg via ORAL
  Filled 2012-11-24 (×2): qty 1

## 2012-11-24 MED ORDER — OLANZAPINE 5 MG PO TABS: 5.0000 mg | ORAL_TABLET | Freq: Four times a day (QID) | ORAL | Status: DC | PRN

## 2012-11-24 MED ORDER — ACETAMINOPHEN 325 MG PO TABS: 650.0000 mg | ORAL_TABLET | Freq: Four times a day (QID) | ORAL | Status: DC | PRN

## 2012-11-24 MED ORDER — OLANZAPINE 5 MG PO TABS
20.0000 mg | ORAL_TABLET | Freq: Every day | ORAL | Status: DC
  Administered 2012-11-25: 20 mg via ORAL
  Filled 2012-11-24: qty 4

## 2012-11-24 MED ORDER — DIVALPROEX SODIUM 500 MG PO DR TAB
1000.0000 mg | DELAYED_RELEASE_TABLET | Freq: Two times a day (BID) | ORAL | Status: DC
  Administered 2012-11-25 (×2): 1000 mg via ORAL
  Filled 2012-11-24 (×2): qty 2

## 2012-11-24 MED ORDER — OLANZAPINE 5 MG PO TABS
10.0000 mg | ORAL_TABLET | Freq: Every morning | ORAL | Status: DC
  Filled 2012-11-24: qty 2

## 2012-11-24 MED ORDER — LEVOTHYROXINE SODIUM 25 MCG PO TABS
25.0000 ug | ORAL_TABLET | Freq: Every day | ORAL | Status: DC
  Administered 2012-11-25: 25 ug via ORAL
  Filled 2012-11-24 (×2): qty 1

## 2012-11-24 MED ORDER — CLONAZEPAM 1 MG PO TABS: 1.0000 mg | ORAL_TABLET | Freq: Two times a day (BID) | ORAL | Status: DC | PRN

## 2012-11-24 MED ORDER — BENZTROPINE MESYLATE 1 MG PO TABS
2.0000 mg | ORAL_TABLET | Freq: Two times a day (BID) | ORAL | Status: DC
  Administered 2012-11-25: 2 mg via ORAL
  Filled 2012-11-24: qty 1
  Filled 2012-11-24: qty 2
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
## 2012-11-24 NOTE — ED Provider Notes (Signed)
CSN: [id number]     Arrival date & time 11/24/12  2223 History  This chart was scribed for non-physician practitioner, Ivonne Andrew, PA-C working with [name], MD by Greggory Stallion, ED scribe. This patient was seen in room WTR4/WLPT4 and the patient's care was started at 10:43 PM.   Chief Complaint  Patient presents with  . Medical Clearance   The history is provided by the patient. No language interpreter was used.    HPI Comments: History provided by the patient and recent medical chart. [name] is a 27 y.o. male who presents to the Emergency Department complaining of sudden onset visual and audio hallucinations. He states the medication he is taking is not helping. He states he is taking all of his medications. Pt states he gets bad migraines sometimes. Pt was here yesterday and left at 2 AM this morning. Pt states that he does not have a psychiatrist right now. Pt denies SI/HI, numbness, fever, chills, sweats and abdominal pain as associated symptoms. No other aggravating or alleviating factors. No other associated symptoms.  Past Medical History  Diagnosis Date  . Bipolar 1 disorder   . Schizophrenia, acute    Past Surgical History  Procedure Laterality Date  . Abdominal surgery     History reviewed. No pertinent family history. History  Substance Use Topics  . Smoking status: Current Every Day Smoker  . Smokeless tobacco: Not on file  . Alcohol Use: No    Review of Systems  Constitutional: Negative for fever and chills.  Gastrointestinal: Negative for abdominal pain.  Neurological: Negative for numbness.  Psychiatric/Behavioral: Positive for hallucinations. Negative for suicidal ideas.  All other systems reviewed and are negative.    Allergies  Benadryl  Home Medications   Current Outpatient Rx  Name  Route  Sig  Dispense  Refill  . acetaminophen (TYLENOL) 650 MG CR tablet   Oral   Take 650 mg by mouth every 6 (six) hours as needed for pain.          . benztropine (COGENTIN) 2 MG tablet   Oral   Take 2 mg by mouth 2 (two) times daily.         . clonazePAM (KLONOPIN) 1 MG tablet   Oral   Take 1 mg by mouth 2 (two) times daily as needed for anxiety.         . divalproex (DEPAKOTE) 500 MG DR tablet   Oral   Take 1,000 mg by mouth 2 (two) times daily.         [name] levothyroxine (SYNTHROID, LEVOTHROID) 25 MCG tablet   Oral   Take 25 mcg by mouth daily before breakfast.         . lithium 600 MG capsule   Oral   Take 600 mg by mouth daily as needed (mood).          . LORazepam (ATIVAN) 2 MG tablet   Oral   Take 2 mg by mouth every 6 (six) hours as needed for anxiety.         [name] OLANZapine (ZYPREXA) 10 MG tablet   Oral   Take 10 mg by mouth every morning.         [name] OLANZapine (ZYPREXA) 20 MG tablet   Oral   Take 20 mg by mouth at bedtime.          [name] OLANZapine (ZYPREXA) 5 MG tablet   Oral   Take 5 mg by mouth every 6 (six) hours  as needed (for bipolar disorder).          BP 114/57  Pulse 77  Temp(Src) 98.8 F (37.1 C) (Oral)  Resp 20  Ht 6\' 3"  (1.905 m)  Wt 318 lb (144.244 kg)  BMI 39.75 kg/m2  SpO2 94%  Physical Exam  Nursing note and vitals reviewed. Constitutional: He is oriented to person, place, and time. He appears well-developed and well-nourished. No distress.  HENT:  Head: Normocephalic and atraumatic.  Eyes: EOM are normal.  Neck: Neck supple. No tracheal deviation present.  Cardiovascular: Normal rate.   Pulmonary/Chest: Effort normal. No respiratory distress.  Musculoskeletal: Normal range of motion.  Neurological: He is alert and oriented to person, place, and time.  Skin: Skin is warm and dry.  Psychiatric:  Flat affect.    ED Course   Procedures   DIAGNOSTIC STUDIES: Oxygen Saturation is 94% on RA, adequate by my interpretation.    COORDINATION OF CARE: 11:00 PM-Discussed treatment plan which includes speaking with ACT team with pt at bedside and pt agreed to  plan.   Results for orders placed during the hospital encounter of 11/24/12  CBC      Result Value Range   WBC 8.2  4.0 - 10.5 K/uL   RBC 4.25  4.22 - 5.81 MIL/uL   Hemoglobin 12.5 (*) 13.0 - 17.0 g/dL   HCT 16.1  09.6 - 04.5 %   MCV 94.1  78.0 - 100.0 fL   MCH 29.4  26.0 - 34.0 pg   MCHC 31.3  30.0 - 36.0 g/dL   RDW 40.9  81.1 - 91.4 %   Platelets 298  150 - 400 K/uL  COMPREHENSIVE METABOLIC PANEL      Result Value Range   Sodium 139  135 - 145 mEq/L   Potassium 3.7  3.5 - 5.1 mEq/L   Chloride 105  96 - 112 mEq/L   CO2 27  19 - 32 mEq/L   Glucose, Bld 89  70 - 99 mg/dL   BUN 12  6 - 23 mg/dL   Creatinine, Ser 7.82  0.50 - 1.35 mg/dL   Calcium 9.3  8.4 - 95.6 mg/dL   Total Protein 6.9  6.0 - 8.3 g/dL   Albumin 3.5  3.5 - 5.2 g/dL   AST 21  0 - 37 U/L   ALT 12  0 - 53 U/L   Alkaline Phosphatase 45  39 - 117 U/L   Total Bilirubin 0.3  0.3 - 1.2 mg/dL   GFR calc non Af Amer >90  >90 mL/min   GFR calc Af Amer >90  >90 mL/min  [name]      Result Value Range   Alcohol, Ethyl (B) <11  0 - 11 mg/dL  URINE RAPID DRUG SCREEN (HOSP PERFORMED)      Result Value Range   Opiates NONE DETECTED  NONE DETECTED   Cocaine NONE DETECTED  NONE DETECTED   Benzodiazepines NONE DETECTED  NONE DETECTED   Amphetamines NONE DETECTED  NONE DETECTED   Tetrahydrocannabinol NONE DETECTED  NONE DETECTED   Barbiturates NONE DETECTED  NONE DETECTED       1. Schizoaffective disorder, bipolar type     MDM  Patient seen and evaluated. Patient appears well. Does not appear on to himself or others. Denies SI or HI. He is calm and cooperative.  Patient is stable for discharge back to his group home. We'll have social work talk with him in the group home in the morning.  I personally performed the services described in this documentation, which was scribed in my presence. The recorded information has been reviewed and is accurate.   Angus Seller, PA-C 11/25/12 [phone]

## 2012-11-24 NOTE — ED Notes (Signed)
Pt called ems because he was hearing voices and had generalized pain.

## 2012-11-24 NOTE — ED Notes (Signed)
EAV:[id number]> Expected date:<BR> Expected time:<BR> Means of arrival:<BR> Comments:<BR> EMS schizophrenic requesting pysch eval to triage

## 2012-11-25 ENCOUNTER — Encounter (HOSPITAL_COMMUNITY): Payer: Self-pay | Admitting: Emergency Medicine

## 2012-11-25 MED ORDER — OLANZAPINE 5 MG PO TABS
20.0000 mg | ORAL_TABLET | Freq: Every day | ORAL | Status: DC
  Administered 2012-11-25: 20 mg via ORAL
  Filled 2012-11-25: qty 4

## 2012-11-25 MED ORDER — BENZTROPINE MESYLATE 1 MG PO TABS: 2.0000 mg | ORAL_TABLET | Freq: Every day | ORAL | Status: DC

## 2012-11-25 NOTE — ED Notes (Signed)
Dr Fayrene Fearing at bedside to speak with pt.  Pending final dispo.  Pt agitated slamming doors, cursing.

## 2012-11-25 NOTE — Progress Notes (Addendum)
CSW spoke with [name] regarding patient returning. CSW spoke with Casimiro Needle at [facility] who will be contacting Campbell Stall. Per Casimiro Needle patient can return to group [facility]. CSW awaiting return call from Campbell Stall regarding other supports for patient. CSW attempting to set up further resources to help with patient follow up and resources in the community including [facility] and [employer] act team.   .Catha Gosselin, LCSWA  [phone] .11/25/2012 8:06am   Addendum: CSW spoke with Casimiro Needle again at [facility] who stated that [facility] director Campbell Stall was not answering his phone. CSW was provided with contact information Campbell Stall [phone]. CSW left message for Mr. Anselm Lis. CSW sought supervision from Director regarding patient who stated that group [facility] would have to administer a 30 day eviction notice and patient can not be left in ED.   Marland KitchenCatha Gosselin, Theresia Majors  [phone] .11/25/2012 8:06am

## 2012-11-25 NOTE — ED Notes (Signed)
Per ACT team, pt. D/C pending group home placement, ACT to work on am on 11/26/12

## 2012-11-25 NOTE — ED Notes (Signed)
Pt states he had a seizure today and is experiencing visual hallucinations.  Denies SI or HI.  Pt calm & cooperative.

## 2012-11-25 NOTE — ED Provider Notes (Signed)
Medical screening examination/treatment/procedure(s) were performed by non-physician practitioner and as supervising physician I was immediately available for consultation/collaboration.  [name], MD 11/25/12 [phone]

## 2012-11-25 NOTE — ED Provider Notes (Signed)
I was asked by nursing to see [name] as he, again, is wanting to leave.  He statesthat he is his own guardian, and no info on chart to contradict.  He is oriented and lucid.  He has food and shelter at his friend, Darrel's house tonight.  He states that his medications are at his cousins house and he has "all of them".  His judgement and insight are currently apporpriate.  No criteria for hold, or IVC.  Pt to be DC'd.  [name], MD 11/25/12 [phone]
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
## 2012-11-26 NOTE — Progress Notes (Signed)
CSW received call from patient [facility] care coordinator [phone].  Per Clydie Braun, she will be contacting patient mother and patient previous group him. Care coordinator asked for patient to be given her number if patient is seen back in the ED. She suggested a [facility] program, and will be checking to see if patient is on the wait list.   .Catha Gosselin, Theresia Majors  [phone] .11/26/2012 12:55pm

## 2012-11-27 ENCOUNTER — Encounter (HOSPITAL_COMMUNITY): Payer: Self-pay | Admitting: Adult Health

## 2012-11-27 ENCOUNTER — Emergency Department (HOSPITAL_COMMUNITY)
Admission: EM | Admit: 2012-11-27 | Discharge: 2012-11-27 | Disposition: A | Discharge status: Home / Self Care | Payer: Medicaid Other | Attending: Emergency Medicine | Admitting: Emergency Medicine

## 2012-11-27 DIAGNOSIS — Z79899 Other long term (current) drug therapy: Secondary | ICD-10-CM | POA: Insufficient documentation

## 2012-11-27 DIAGNOSIS — K219 Gastro-esophageal reflux disease without esophagitis: Secondary | ICD-10-CM | POA: Insufficient documentation

## 2012-11-27 DIAGNOSIS — R109 Unspecified abdominal pain: Secondary | ICD-10-CM | POA: Insufficient documentation

## 2012-11-27 DIAGNOSIS — K3189 Other diseases of stomach and duodenum: Secondary | ICD-10-CM | POA: Insufficient documentation

## 2012-11-27 DIAGNOSIS — K30 Functional dyspepsia: Secondary | ICD-10-CM

## 2012-11-27 DIAGNOSIS — F319 Bipolar disorder, unspecified: Secondary | ICD-10-CM | POA: Insufficient documentation

## 2012-11-27 DIAGNOSIS — F17200 Nicotine dependence, unspecified, uncomplicated: Secondary | ICD-10-CM | POA: Insufficient documentation

## 2012-11-27 DIAGNOSIS — F209 Schizophrenia, unspecified: Secondary | ICD-10-CM | POA: Insufficient documentation

## 2012-11-27 MED ORDER — ONDANSETRON 4 MG PO TBDP
8.0000 mg | ORAL_TABLET | Freq: Once | ORAL | Status: AC
  Administered 2012-11-27: 8 mg via ORAL
  Filled 2012-11-27: qty 2

## 2012-11-27 MED ORDER — OMEPRAZOLE 20 MG PO CPDR: 20.0000 mg | DELAYED_RELEASE_CAPSULE | Freq: Every day | ORAL | Status: DC

## 2012-11-27 MED ORDER — GI COCKTAIL ~~LOC~~
30.0000 mL | Freq: Once | ORAL | Status: AC
  Administered 2012-11-27: 30 mL via ORAL
  Filled 2012-11-27: qty 30

## 2012-11-27 NOTE — ED Provider Notes (Signed)
CSN: [id number]     Arrival date & time 11/27/12  1529 History  This chart was scribed for non-physician practitioner Dierdre Forth, PA-C, working with Audree Camel, MD, by Yevette Edwards, ED Scribe. This patient was seen in room TR10C/TR10C and the patient's care was started at 4:28 PM.   First MD Initiated Contact with Patient 11/27/12 1546     Chief Complaint  Patient presents with  . Nausea    Patient is a 27 y.o. male presenting with vomiting. The history is provided by the patient. No language interpreter was used.  Emesis Severity:  Moderate Duration:  24 hours Timing:  Intermittent Chronicity:  Recurrent Associated symptoms: abdominal pain   Associated symptoms: no chills, no fever and no headaches   Risk factors: suspect food intake    HPI Comments: Elihu Milstein is a 27 y.o. male, with bipolar 1 disorder, schizophrenia who presents to the Emergency Department complaining of nausea and emesis which began last night after he had eaten hot wings and pizza.  He states that he began to have episodes of emesis ten minutes after consuming the hots wings and pizza; he reports six episodes of NBNB emesis in the previous 24 hours which resolved this morning. The pt also reports experiencing mild abdominal pain and burning in his throat. The pt denies experiencing any hematochezia, hematuria, a fever, SOB, headaches, or chest pain. The pt also denies taking medication for acid reflex. He reports that he ate hot dogs and french fries for lunch without difficulty.  He has had prior episodes of similar symptoms which were catalyzed by food.  Past Medical History  Diagnosis Date  . Bipolar 1 disorder   . Schizophrenia, acute    Past Surgical History  Procedure Laterality Date  . Abdominal surgery     History reviewed. No pertinent family history. History  Substance Use Topics  . Smoking status: Current Every Day Smoker  . Smokeless tobacco: Not on file  . Alcohol Use: No     Review of Systems  Constitutional: Negative for fever and chills.  Eyes: Negative for photophobia.  Respiratory: Negative for shortness of breath.   Cardiovascular: Negative for chest pain.  Gastrointestinal: Positive for nausea, vomiting and abdominal pain. Negative for blood in stool.  Genitourinary: Negative for hematuria.  Neurological: Negative for seizures and headaches.  All other systems reviewed and are negative.    Allergies  Benadryl  Home Medications   Current Outpatient Rx  Name  Route  Sig  Dispense  Refill  . acetaminophen (TYLENOL) 650 MG CR tablet   Oral   Take 650 mg by mouth every 6 (six) hours as needed for pain.         . benztropine (COGENTIN) 2 MG tablet   Oral   Take 2 mg by mouth 2 (two) times daily.         . clonazePAM (KLONOPIN) 1 MG tablet   Oral   Take 1 mg by mouth 2 (two) times daily as needed for anxiety.         . divalproex (DEPAKOTE) 500 MG DR tablet   Oral   Take 1,000 mg by mouth 2 (two) times daily.         [name] levothyroxine (SYNTHROID, LEVOTHROID) 25 MCG tablet   Oral   Take 25 mcg by mouth daily before breakfast.         . lithium 600 MG capsule   Oral   Take 600 mg by mouth daily  as needed (mood).          . LORazepam (ATIVAN) 2 MG tablet   Oral   Take 2 mg by mouth every 6 (six) hours as needed for anxiety.         [name] OLANZapine (ZYPREXA) 10 MG tablet   Oral   Take 10 mg by mouth every morning.         [name] OLANZapine (ZYPREXA) 20 MG tablet   Oral   Take 20 mg by mouth at bedtime.          [name] OLANZapine (ZYPREXA) 5 MG tablet   Oral   Take 5 mg by mouth every 6 (six) hours as needed (for bipolar disorder).         [name] omeprazole (PRILOSEC) 20 MG capsule   Oral   Take 1 capsule (20 mg total) by mouth daily.   30 capsule   0    Triage Vitals: BP 133/78  Pulse 80  Temp(Src) 98 F (36.7 C) (Oral)  Resp 16  SpO2 100%  Physical Exam  Nursing note and vitals reviewed. Constitutional: He is  oriented to person, place, and time. He appears well-developed and well-nourished. No distress.  Awake, alert, nontoxic appearance  HENT:  Head: Normocephalic and atraumatic.  Mouth/Throat: Oropharynx is clear and moist. No oropharyngeal exudate.  Eyes: Conjunctivae are normal. Pupils are equal, round, and reactive to light. No scleral icterus.  Neck: Normal range of motion. Neck supple.  Cardiovascular: Normal rate, regular rhythm, normal heart sounds and intact distal pulses.   No murmur heard. Pulmonary/Chest: Effort normal and breath sounds normal. No respiratory distress. He has no wheezes.  Abdominal: Soft. Normal appearance and bowel sounds are normal. He exhibits no distension, no fluid wave and no mass. There is no tenderness. There is no rigidity, no rebound, no guarding and no CVA tenderness.  Musculoskeletal: Normal range of motion. He exhibits no edema.  Lymphadenopathy:    He has no cervical adenopathy.  Neurological: He is alert and oriented to person, place, and time. He exhibits normal muscle tone. Coordination normal.  Speech is clear and goal oriented Moves extremities without ataxia  Skin: Skin is warm and dry. He is not diaphoretic. No erythema.  Psychiatric: He has a normal mood and affect.    ED Course   DIAGNOSTIC STUDIES:  Oxygen Saturation is 100% on room air, normal by my interpretation.    COORDINATION OF CARE:  4:30 PM- Discussed treatment plan with patient which includes medication for his acid reflux and advisement not to eat hot wings and pizza anymore, and the patient agreed to the plan.   Procedures (including critical care time)  Labs Reviewed - No data to display No results found. 1. Acid indigestion   2. Acid reflux     MDM  Baldomero Lamy presents with resolved abd pain, several episodes of reported vomiting and complete resolution of his symptoms which occurred after eating hot wings.  Pt has seen several times in the past for this  complaint all associated with hot wing intake.  Today he is without abnormality on his physical exam with a soft and nontender abdomen. Patient states his pain and nausea and vomiting has all resolved at this time. He is eating and drinking in the room without difficulty.   At this time there does not appear to be any evidence of an acute emergency medical condition and the patient appears stable for discharge with appropriate outpatient follow up. Diagnosis was discussed  with patient who verbalizes understanding and is agreeable to discharge.   [name] Argus Caraher, PA-C 11/27/12 1649

## 2012-11-27 NOTE — ED Notes (Signed)
Presents with nausea last night and burning in throat and belly after eating hot wings and pizza. Reports abdominal burning and throat burning today denies pain. Pt is alert and oriented, VSS

## 2012-11-27 NOTE — ED Provider Notes (Signed)
Medical screening examination/treatment/procedure(s) were performed by non-physician practitioner and as supervising physician I was immediately available for consultation/collaboration.   Audree Camel, MD 11/27/12 [phone]

## 2012-11-28 ENCOUNTER — Emergency Department (HOSPITAL_COMMUNITY)
Admission: EM | Admit: 2012-11-28 | Discharge: 2012-11-28 | Disposition: A | Discharge status: Home / Self Care | Payer: Medicaid Other | Attending: Emergency Medicine | Admitting: Emergency Medicine

## 2012-11-28 ENCOUNTER — Emergency Department (HOSPITAL_COMMUNITY)
Admission: EM | Admit: 2012-11-28 | Discharge: 2012-11-28 | Disposition: A | Discharge status: Home / Self Care | Payer: Medicaid Other | Source: Home / Self Care | Attending: Emergency Medicine | Admitting: Emergency Medicine

## 2012-11-28 ENCOUNTER — Encounter (HOSPITAL_COMMUNITY): Payer: Self-pay | Admitting: Emergency Medicine

## 2012-11-28 DIAGNOSIS — R569 Unspecified convulsions: Secondary | ICD-10-CM | POA: Insufficient documentation

## 2012-11-28 DIAGNOSIS — F17200 Nicotine dependence, unspecified, uncomplicated: Secondary | ICD-10-CM | POA: Insufficient documentation

## 2012-11-28 DIAGNOSIS — Z79899 Other long term (current) drug therapy: Secondary | ICD-10-CM | POA: Insufficient documentation

## 2012-11-28 DIAGNOSIS — F319 Bipolar disorder, unspecified: Secondary | ICD-10-CM | POA: Insufficient documentation

## 2012-11-28 DIAGNOSIS — F259 Schizoaffective disorder, unspecified: Secondary | ICD-10-CM | POA: Insufficient documentation

## 2012-11-28 DIAGNOSIS — F2089 Other schizophrenia: Secondary | ICD-10-CM | POA: Insufficient documentation

## 2012-11-28 DIAGNOSIS — G8929 Other chronic pain: Secondary | ICD-10-CM | POA: Insufficient documentation

## 2012-11-28 DIAGNOSIS — M79609 Pain in unspecified limb: Secondary | ICD-10-CM | POA: Insufficient documentation

## 2012-11-28 DIAGNOSIS — M2559 Pain in other specified joint: Secondary | ICD-10-CM | POA: Insufficient documentation

## 2012-11-28 DIAGNOSIS — R112 Nausea with vomiting, unspecified: Secondary | ICD-10-CM | POA: Insufficient documentation

## 2012-11-28 DIAGNOSIS — F250 Schizoaffective disorder, bipolar type: Secondary | ICD-10-CM

## 2012-11-28 DIAGNOSIS — Z0000 Encounter for general adult medical examination without abnormal findings: Secondary | ICD-10-CM | POA: Insufficient documentation

## 2012-11-28 HISTORY — DX: Unspecified convulsions: R56.9

## 2012-11-28 MED ORDER — ACETAMINOPHEN 325 MG PO TABS
650.0000 mg | ORAL_TABLET | Freq: Once | ORAL | Status: AC
  Administered 2012-11-28: 650 mg via ORAL
  Filled 2012-11-28: qty 2

## 2012-11-28 NOTE — ED Notes (Signed)
[place] pt c/o L ankle pain x 2 months after injury playing basketball. No deformity per EMS.

## 2012-11-28 NOTE — ED Notes (Signed)
Patient is alert and oriented x3.  He was given DC instructions and follow up visit instructions.  Patient gave verbal understanding.  He was DC ambulatory under his own power to lobby to await bus .  V/S stable.  He was not showing any signs of distress on DC

## 2012-11-28 NOTE — ED Provider Notes (Signed)
CSN: [id number]     Arrival date & time 11/28/12  0127 History     First MD Initiated Contact with Patient 11/28/12 0141     Chief Complaint  Patient presents with  . Seizures  . Emesis   (Consider location/radiation/quality/duration/timing/severity/associated sxs/prior Treatment) HPI 27 yo male presents to the ER via EMS with complaint of nausea, vomiting, and seizures.  Pt has h/o bipolar disorder, schizophrenia.  Pt was seen earlier today due to n/v, thought to be diet related.  Pt reports he has actually done well since that visit, has not had any further vomiting, and nausea resolved.  Pt reports 3 seizures today, one at 10 am, one at 3 pm, and one at 11 pm.  Seizures lasted about 30 minutes.  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  He wants to be checked out thoroughly.  Pt did not mention the 2 seizures at prior ED visit "because I didn't think they were important".  No bladder or bowel dysfuction, pt reports he was awake during all seizures.  Past Medical History  Diagnosis Date  . Bipolar 1 disorder   . Schizophrenia, acute   . Seizures    Past Surgical History  Procedure Laterality Date  . Abdominal surgery     History reviewed. No pertinent family history. History  Substance Use Topics  . Smoking status: Current Every Day Smoker  . Smokeless tobacco: Not on file  . Alcohol Use: No    Review of Systems  All other systems reviewed and are negative.    Allergies  Benadryl  Home Medications   Current Outpatient Rx  Name  Route  Sig  Dispense  Refill  . acetaminophen (TYLENOL) 650 MG CR tablet   Oral   Take 650 mg by mouth every 6 (six) hours as needed for pain.         . benztropine (COGENTIN) 2 MG tablet   Oral   Take 2 mg by mouth 2 (two) times daily.         . clonazePAM (KLONOPIN) 1 MG tablet   Oral   Take 1 mg by mouth 2 (two) times daily as needed for anxiety.         . divalproex (DEPAKOTE) 500 MG DR tablet   Oral  Take 1,000 mg by mouth 2 (two) times daily.         [name] levothyroxine (SYNTHROID, LEVOTHROID) 25 MCG tablet   Oral   Take 25 mcg by mouth daily before breakfast.         . lithium 600 MG capsule   Oral   Take 600 mg by mouth daily as needed (mood).          . LORazepam (ATIVAN) 2 MG tablet   Oral   Take 2 mg by mouth every 6 (six) hours as needed for anxiety.         [name] OLANZapine (ZYPREXA) 10 MG tablet   Oral   Take 10 mg by mouth every morning.         [name] OLANZapine (ZYPREXA) 20 MG tablet   Oral   Take 20 mg by mouth at bedtime.          [name] omeprazole (PRILOSEC) 20 MG capsule   Oral   Take 1 capsule (20 mg total) by mouth daily.   30 capsule   0   . OLANZapine (ZYPREXA) 5 MG tablet   Oral   Take 5 mg by mouth every 6 (six)  hours as needed (for bipolar disorder).          BP 109/66  Pulse 66  Temp(Src) 99.1 F (37.3 C) (Oral)  Resp 20  SpO2 100% Physical Exam  Nursing note and vitals reviewed. Constitutional: He is oriented to person, place, and time. He appears well-developed and well-nourished.  HENT:  Head: Normocephalic and atraumatic.  Right Ear: External ear normal.  Left Ear: External ear normal.  Nose: Nose normal.  Mouth/Throat: Oropharynx is clear and moist.  Eyes: Conjunctivae and EOM are normal. Pupils are equal, round, and reactive to light.  Neck: Normal range of motion. Neck supple. No JVD present. No tracheal deviation present. No thyromegaly present.  Cardiovascular: Normal rate, regular rhythm, normal heart sounds and intact distal pulses.  Exam reveals no gallop and no friction rub.   No murmur heard. Pulmonary/Chest: Effort normal and breath sounds normal. No stridor. No respiratory distress. He has no wheezes. He has no rales. He exhibits no tenderness.  Abdominal: Soft. Bowel sounds are normal. He exhibits no distension and no mass. There is no tenderness. There is no rebound and no guarding.  Musculoskeletal: Normal range of  motion. He exhibits no edema and no tenderness.  Lymphadenopathy:    He has no cervical adenopathy.  Neurological: He is alert and oriented to person, place, and time. He has normal reflexes. No cranial nerve deficit. He exhibits normal muscle tone. Coordination normal.  Skin: Skin is warm and dry. No rash noted. No erythema. No pallor.  Psychiatric:  Poor insight and judgment, odd affect, no si/hi, no hallucinations    ED Course   Procedures (including critical care time)  Labs Reviewed - No data to display No results found. 1. Schizoaffective disorder, bipolar type   2. Normal physical exam     MDM  26 yo male with reported seizures.  Exam normal. Possible repeat pseudoseizure?  Feel he is stable for d/c home.  Olivia Mackie, MD 11/28/12 [phone]

## 2012-11-28 NOTE — ED Provider Notes (Signed)
CSN: [id number]     Arrival date & time 11/28/12  1954 History    This chart was scribed for Junius Finner, PA working with Toy Baker, MD by Quintella Reichert, ED Scribe. This patient was seen in room WTR5/WTR5 and the patient's care was started at 8:13 PM.     Chief Complaint  Patient presents with  . Ankle Pain    The history is provided by the patient and medical records. No language interpreter was used.    HPI Comments: Cristin Szatkowski is a 27 y.o. male with h/o schizophrenia and bipolar disorder brought by EMS to the Emergency Department complaining of several weeks of left ankle pain.  Pt states that he had surgery to the ankle 1 1/2 years ago and has had pain to the ankle intermittently for 2 weeks.  However he told triage nurse that he has had pain for 2 months after an injury playing basketball.  Pain is described as "sharp pain going through my bone."  He rates pain at a severity of 9.5/10 presently.  It is exacerbated by walking.  He has attempted to treat pain with Excedrin, without relief.  Pt states that he has discussed his ankle pain with his PCP and was given pain medications that did not provide any relief.   PCP is Dr. Colette Ribas per pt's report  Past Medical History  Diagnosis Date  . Bipolar 1 disorder   . Schizophrenia, acute   . Seizures    Past Surgical History  Procedure Laterality Date  . Abdominal surgery     History reviewed. No pertinent family history. History  Substance Use Topics  . Smoking status: Current Every Day Smoker  . Smokeless tobacco: Not on file  . Alcohol Use: No     Review of Systems  Musculoskeletal: Positive for arthralgias.  All other systems reviewed and are negative.      Allergies  Benadryl  Home Medications   Current Outpatient Rx  Name  Route  Sig  Dispense  Refill  . acetaminophen (TYLENOL) 650 MG CR tablet   Oral   Take 650 mg by mouth every 6 (six) hours as needed for pain.         . benztropine (COGENTIN)  2 MG tablet   Oral   Take 2 mg by mouth 2 (two) times daily.         . clonazePAM (KLONOPIN) 1 MG tablet   Oral   Take 1 mg by mouth 2 (two) times daily as needed for anxiety.         . divalproex (DEPAKOTE) 500 MG DR tablet   Oral   Take 1,000 mg by mouth 2 (two) times daily.         [name] levothyroxine (SYNTHROID, LEVOTHROID) 25 MCG tablet   Oral   Take 25 mcg by mouth daily before breakfast.         . lithium 600 MG capsule   Oral   Take 600 mg by mouth daily as needed (mood).          . LORazepam (ATIVAN) 2 MG tablet   Oral   Take 2 mg by mouth every 6 (six) hours as needed for anxiety.         [name] OLANZapine (ZYPREXA) 10 MG tablet   Oral   Take 10 mg by mouth every morning.         [name] OLANZapine (ZYPREXA) 20 MG tablet   Oral   Take 20 mg  by mouth at bedtime.          [name] OLANZapine (ZYPREXA) 5 MG tablet   Oral   Take 5 mg by mouth every 6 (six) hours as needed (for bipolar disorder).         [name] omeprazole (PRILOSEC) 20 MG capsule   Oral   Take 1 capsule (20 mg total) by mouth daily.   30 capsule   0    BP 134/68  Pulse 60  Temp(Src) 98.2 F (36.8 C) (Oral)  Resp 20  SpO2 100%  Physical Exam  Nursing note and vitals reviewed. Constitutional: He is oriented to person, place, and time. He appears well-developed and well-nourished. No distress.  HENT:  Head: Normocephalic and atraumatic.  Eyes: EOM are normal.  Neck: Neck supple. No tracheal deviation present.  Cardiovascular: Normal rate.   Pulmonary/Chest: Effort normal. No respiratory distress.  Musculoskeletal: Normal range of motion.       Left foot: He exhibits tenderness.       Feet:  Tender to palpation over dorsum of left foot. No deformity.  No erythema, edema or warmth. FROM.  Neurological: He is alert and oriented to person, place, and time.  Skin: Skin is warm and dry.  Psychiatric: He has a normal mood and affect. His behavior is normal.    ED Course  Procedures (including  critical care time)  DIAGNOSTIC STUDIES: Oxygen Saturation is 100% on room air, normal by my interpretation.    COORDINATION OF CARE: 8:20 PM-Discussed treatment plan which includes pain medication, ACE bandage and f/u with PCP with pt at bedside and pt agreed to plan.     Labs Reviewed - No data to display No results found. 1. Chronic foot pain, left      MDM  Pt c/o intermittent left foot pain, worse with ambulation.  Reports hx of foot surgery 1-76yrs ago.  Was unable to find surgery notes in pt record. Was difficult to get a good hx from pt.  Pt initially told nursing staff pain had been present for 57mo but when asked again he said "comes and goes for 2 weeks"   No obvious surgical scar seen on exam. Pt denies recent trauma to foot.  No deformity, edema, or warmth.  Neurovascularly in tact.  No imaging needed at this time.  Placed in ace wrap.  Acetaminophen given in ED.  Encouraged pt to take it easy when walking and take frequent breaks, R.I.C.E.  And f/u with [facility] or previously established PCP for ongoing pain.  I personally performed the services described in this documentation, which was scribed in my presence. The recorded information has been reviewed and is accurate.    Junius Finner, PA-C 11/28/12 2045

## 2012-11-28 NOTE — ED Notes (Signed)
Per EMS pt states he had a seizure on Thursday around 2pm and has been having vomiting  Pt states he has been taking his medications  Had some bleeding from his mouth during the seizure

## 2012-11-29 NOTE — ED Provider Notes (Signed)
Medical screening examination/treatment/procedure(s) were performed by non-physician practitioner and as supervising physician I was immediately available for consultation/collaboration.  Tamar Miano T Jeremyah Jelley, MD 11/29/12 1517 

## 2012-11-29 NOTE — Progress Notes (Signed)
CSW was called by nurse Autumn concerning pt having been to the ED multiple times today and continuing to hang around after being discharged, being disruptive, and in danger of being arrested.  CSW talked with pt about his current situation.  Pt reported that he did not have anywhere else to go.  CSW completed a brief assessment with pt.  He denied SI/HI/AH/VH.  Pt reported that he was tired and hungry and that he had spent the day riding around playing basketball and hurt his ankle.  Pt reported that he had not eaten anything but a soft drink and chips, and that he had spent sometime trying to get money for food.  Pt would not elaborate.  Pt reported that his cousin had gone back to [place], and that he could not stay with any friends.  Pt would/could not give CSW any names or numbers of people that he could  possibly stay with even for the night.  Pt reported that he would like to go back to [facility] or [facility] where "he had people" . [facility] care coordinator Clydie Braun had been contacted by nurses station and asked that CSW call her.  Care coordinator reported that pt did not really have any connections in the county and asked if there was anything that pt could be admitted to hospital for.  CSW explained that pt had been medically cleared and was not voicing any SI/HI or presenting as psychotic.   CC suggested that CSW contact [facility] and explain to them the pts situation to see if they could assist him.   CSW allowed pt to speak with CC over the phone about possible options. CSW left pt in the ED waiting room to go make calls to try to assist him.   CSW spoke with [place] at [facility] who reported that pt would have to be IVC'ed to be able to be admitted to [facility]. CSW called [facility] as pt stated that he had been in respite with them.  Left msg.  CSW called [facility].  No answer.  Spoke with Graylin Shiver at [facility] and she stated that the facility had an intake process that could not be done  at that time of the night.  When CSW arrived back in the waiting room, to give pt the listing to follow up with on his own, pt was outside with police.  CSW went out to speak with pt and let him know the results.  Officer told pt that he could not stay in the waiting room and continue to be disruptive and that he would be charged for trespassing if he did not leave.  Pt became angry and threatening.  Officer gave pt several opportunities to leave and then told him that he was contacting someone to come and arrest him.   Pt still would not leave.  Pt was arrested and taken from the premises by [employer].   CSW called CC to update her on the status of pt.    Marva Panda, Theresia Majors  [phone] .11/29/2012  12:38 am

## 2012-12-01 NOTE — ED Provider Notes (Signed)
Cosign for 11/21/12 Note by Muthersbaugh, PA-C   Medical screening examination/treatment/procedure(s) were performed by non-physician practitioner and as supervising physician I was immediately available for consultation/collaboration.  Flint Melter, MD 12/01/12 [phone]

## 2012-12-05 ENCOUNTER — Emergency Department (HOSPITAL_COMMUNITY)
Admission: EM | Admit: 2012-12-05 | Discharge: 2012-12-05 | Discharge status: Against Medical Advice | Payer: Medicaid Other | Attending: Emergency Medicine | Admitting: Emergency Medicine

## 2012-12-05 ENCOUNTER — Encounter (HOSPITAL_COMMUNITY): Payer: Self-pay | Admitting: Emergency Medicine

## 2012-12-05 DIAGNOSIS — R51 Headache: Secondary | ICD-10-CM | POA: Insufficient documentation

## 2012-12-05 DIAGNOSIS — F319 Bipolar disorder, unspecified: Secondary | ICD-10-CM | POA: Insufficient documentation

## 2012-12-05 DIAGNOSIS — G40909 Epilepsy, unspecified, not intractable, without status epilepticus: Secondary | ICD-10-CM | POA: Insufficient documentation

## 2012-12-05 DIAGNOSIS — F17200 Nicotine dependence, unspecified, uncomplicated: Secondary | ICD-10-CM | POA: Insufficient documentation

## 2012-12-05 DIAGNOSIS — Z79899 Other long term (current) drug therapy: Secondary | ICD-10-CM | POA: Insufficient documentation

## 2012-12-05 DIAGNOSIS — F209 Schizophrenia, unspecified: Secondary | ICD-10-CM | POA: Insufficient documentation

## 2012-12-05 MED ORDER — IBUPROFEN 800 MG PO TABS
800.0000 mg | ORAL_TABLET | Freq: Once | ORAL | Status: AC
  Administered 2012-12-05: 800 mg via ORAL
  Filled 2012-12-05: qty 1
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
## 2012-12-05 NOTE — ED Notes (Signed)
Pt escorted off property by [employer]. Pt ambulatory with steady gait. Pt swearing at staff and [employer] upon leaving hospital premises.

## 2012-12-05 NOTE — ED Notes (Signed)
Pt states he does not want any more treatment. Pt demanding a bus pass. Informed pt that there are no more bus passes. Pt started swearing at staff. [employer] called to bedside.

## 2012-12-05 NOTE — ED Notes (Addendum)
Pt reports that the group home he was kicked out of has his Depakote for seizures. Pt reports that he has not had his meds for 1 week and he is scared he will have a seizure. Pt is A&O and in NAD Pt adds that he is homeless and wants to be placed in a group home in [place].
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
## 2012-12-06 NOTE — ED Provider Notes (Signed)
Medical screening examination/treatment/procedure(s) were performed by non-physician practitioner and as supervising physician I was immediately available for consultation/collaboration.   Khamarion Bjelland T Saiya Crist, MD 12/06/12 1122 

## 2012-12-12 ENCOUNTER — Encounter (HOSPITAL_COMMUNITY): Payer: Self-pay

## 2012-12-12 ENCOUNTER — Emergency Department (HOSPITAL_COMMUNITY)
Admission: EM | Admit: 2012-12-12 | Discharge: 2012-12-12 | Disposition: A | Discharge status: Home / Self Care | Payer: Medicaid Other | Source: Home / Self Care | Attending: Emergency Medicine | Admitting: Emergency Medicine

## 2012-12-12 ENCOUNTER — Emergency Department (HOSPITAL_COMMUNITY)
Admission: EM | Admit: 2012-12-12 | Discharge: 2012-12-12 | Disposition: A | Discharge status: Home / Self Care | Payer: Medicaid Other | Attending: Emergency Medicine | Admitting: Emergency Medicine

## 2012-12-12 ENCOUNTER — Encounter (HOSPITAL_COMMUNITY): Payer: Self-pay | Admitting: *Deleted

## 2012-12-12 DIAGNOSIS — F319 Bipolar disorder, unspecified: Secondary | ICD-10-CM | POA: Insufficient documentation

## 2012-12-12 DIAGNOSIS — Z79899 Other long term (current) drug therapy: Secondary | ICD-10-CM | POA: Insufficient documentation

## 2012-12-12 DIAGNOSIS — K920 Hematemesis: Secondary | ICD-10-CM | POA: Diagnosis present

## 2012-12-12 DIAGNOSIS — F17200 Nicotine dependence, unspecified, uncomplicated: Secondary | ICD-10-CM | POA: Insufficient documentation

## 2012-12-12 DIAGNOSIS — G40909 Epilepsy, unspecified, not intractable, without status epilepticus: Secondary | ICD-10-CM | POA: Insufficient documentation

## 2012-12-12 DIAGNOSIS — R1110 Vomiting, unspecified: Secondary | ICD-10-CM | POA: Insufficient documentation

## 2012-12-12 DIAGNOSIS — Z139 Encounter for screening, unspecified: Secondary | ICD-10-CM

## 2012-12-12 DIAGNOSIS — F2089 Other schizophrenia: Secondary | ICD-10-CM | POA: Insufficient documentation

## 2012-12-12 DIAGNOSIS — R109 Unspecified abdominal pain: Secondary | ICD-10-CM | POA: Insufficient documentation

## 2012-12-12 LAB — POCT I-STAT, CHEM 8
Creatinine, Ser: 1.1 mg/dL (ref 0.50–1.35)
Glucose, Bld: 102 mg/dL — ABNORMAL HIGH (ref 70–99)
HCT: 41 % (ref 39.0–52.0)
Hemoglobin: 13.9 g/dL (ref 13.0–17.0)
Sodium: 144 mEq/L (ref 135–145)
TCO2: 27 mmol/L (ref 0–100)

## 2012-12-12 MED ORDER — ONDANSETRON 4 MG PO TBDP: 4.0000 mg | ORAL_TABLET | Freq: Once | ORAL | Status: DC

## 2012-12-12 MED ORDER — ALUM & MAG HYDROXIDE-SIMETH 200-200-20 MG/5ML PO SUSP
15.0000 mL | Freq: Once | ORAL | Status: AC
  Administered 2012-12-12: 15 mL via ORAL
  Filled 2012-12-12: qty 30

## 2012-12-12 MED ORDER — POTASSIUM CHLORIDE CRYS ER 20 MEQ PO TBCR
40.0000 meq | EXTENDED_RELEASE_TABLET | Freq: Once | ORAL | Status: AC
  Administered 2012-12-12: 40 meq via ORAL
  Filled 2012-12-12: qty 2

## 2012-12-12 MED ORDER — PANTOPRAZOLE SODIUM 20 MG PO TBEC: 20.0000 mg | DELAYED_RELEASE_TABLET | Freq: Every day | ORAL | Status: DC
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
## 2012-12-12 NOTE — ED Provider Notes (Signed)
CSN: [id number]     Arrival date & time 12/12/12  1444 History     First MD Initiated Contact with Patient 12/12/12 1456     Chief Complaint  Patient presents with  . Emesis   (Consider location/radiation/quality/duration/timing/severity/associated sxs/prior Treatment) Patient is a 27 y.o. male presenting with vomiting. The history is provided by the patient.  Emesis Severity:  Mild Duration:  1 day Timing:  Intermittent Number of daily episodes:  6 yesterday, 4 today Quality:  Stomach contents (occasional streak of brb) Able to tolerate:  Liquids and solids Onset of vomiting after eating: varies. Progression:  Unchanged Chronicity:  New Recent urination:  Normal Relieved by:  Nothing Worsened by:  Nothing tried Ineffective treatments:  None tried Associated symptoms: no abdominal pain, no cough, no diarrhea, no fever, no headaches, no sore throat and no URI     Past Medical History  Diagnosis Date  . Bipolar 1 disorder   . Schizophrenia, acute   . Seizures    Past Surgical History  Procedure Laterality Date  . Abdominal surgery     No family history on file. History  Substance Use Topics  . Smoking status: Current Every Day Smoker  . Smokeless tobacco: Not on file  . Alcohol Use: No    Review of Systems  Constitutional: Negative for fever.  HENT: Negative for sore throat, rhinorrhea, drooling and neck pain.   Eyes: Negative for pain.  Respiratory: Negative for cough and shortness of breath.   Cardiovascular: Negative for chest pain and leg swelling.  Gastrointestinal: Positive for vomiting. Negative for nausea, abdominal pain and diarrhea.  Genitourinary: Negative for dysuria and hematuria.       Dark stools x 1 week.  Musculoskeletal: Negative for gait problem.  Skin: Negative for color change.  Neurological: Negative for numbness and headaches.  Hematological: Negative for adenopathy.  Psychiatric/Behavioral: Negative for behavioral problems.  All other  systems reviewed and are negative.    Allergies  Benadryl  Home Medications   Current Outpatient Rx  Name  Route  Sig  Dispense  Refill  . acetaminophen (TYLENOL) 650 MG CR tablet   Oral   Take 650 mg by mouth every 6 (six) hours as needed for pain.         . benztropine (COGENTIN) 2 MG tablet   Oral   Take 2 mg by mouth 2 (two) times daily.         . clonazePAM (KLONOPIN) 1 MG tablet   Oral   Take 1 mg by mouth 2 (two) times daily as needed for anxiety.         . divalproex (DEPAKOTE) 500 MG DR tablet   Oral   Take 1,000 mg by mouth 2 (two) times daily.         [name] levothyroxine (SYNTHROID, LEVOTHROID) 25 MCG tablet   Oral   Take 25 mcg by mouth daily before breakfast.         . lithium 600 MG capsule   Oral   Take 600 mg by mouth daily as needed (mood).          . LORazepam (ATIVAN) 2 MG tablet   Oral   Take 2 mg by mouth every 6 (six) hours as needed for anxiety.         [name] OLANZapine (ZYPREXA) 10 MG tablet   Oral   Take 10 mg by mouth every morning.         [name] OLANZapine (ZYPREXA) 20 MG  tablet   Oral   Take 20 mg by mouth at bedtime.          [name] OLANZapine (ZYPREXA) 5 MG tablet   Oral   Take 5 mg by mouth every 6 (six) hours as needed (for bipolar disorder).         [name] omeprazole (PRILOSEC) 20 MG capsule   Oral   Take 1 capsule (20 mg total) by mouth daily.   30 capsule   0    BP 180/76  Pulse 76  Temp(Src) 98.1 F (36.7 C) (Oral)  Resp 18  SpO2 97% Physical Exam  Nursing note and vitals reviewed. Constitutional: He is oriented to person, place, and time. He appears well-developed and well-nourished.  HENT:  Head: Normocephalic and atraumatic.  Right Ear: External ear normal.  Left Ear: External ear normal.  Nose: Nose normal.  Mouth/Throat: Oropharynx is clear and moist. No oropharyngeal exudate.  Eyes: Conjunctivae and EOM are normal. Pupils are equal, round, and reactive to light.  Neck: Normal range of motion. Neck  supple.  Cardiovascular: Normal rate, regular rhythm, normal heart sounds and intact distal pulses.  Exam reveals no gallop and no friction rub.   No murmur heard. Pulmonary/Chest: Effort normal and breath sounds normal. No respiratory distress. He has no wheezes.  Abdominal: Soft. Bowel sounds are normal. He exhibits no distension. There is no tenderness. There is no rebound and no guarding.  Musculoskeletal: Normal range of motion. He exhibits no edema and no tenderness.  Neurological: He is alert and oriented to person, place, and time.  Skin: Skin is warm and dry.  Psychiatric: He has a normal mood and affect. His behavior is normal.    ED Course   Procedures (including critical care time)  Labs Reviewed  POCT I-STAT, CHEM 8 - Abnormal; Notable for the following:    Potassium 3.2 (*)    Glucose, Bld 102 (*)    All other components within normal limits   No results found. 1. Hematemesis     MDM  The patient is a 28 year old male who presents with emesis since yesterday. The patient states he had 6 episodes of emesis yesterday and 4 today. He notes several streaks of bright red blood in his vomit. He denies any fever, or pain. He does note mild generalized weakness for one week. He refuses a rectal exam. He appears well on exam. Vital signs are unremarkable. Suspect Mallory-Weiss tear. Will get istat chem 8 and zofran for nausea.   4:45 PM: Will supplement potassium, pt tolerating po on exam.  I have discussed the diagnosis/risks/treatment options with the patient and believe the pt to be eligible for discharge home to follow-up and establish with a pcp as needed. We also discussed returning to the ED immediately if new or worsening sx occur. We discussed the sx which are most concerning (e.g., worsening hematemesis, bloody stools, abd pain) that necessitate immediate return. Any new prescriptions provided to the patient are listed below.  Discharge Medication List as of 12/12/2012   4:46 PM    START taking these medications   Details  pantoprazole (PROTONIX) 20 MG tablet Take 1 tablet (20 mg total) by mouth daily., Starting 12/12/2012, Until Discontinued, Print         Junius Argyle, MD 12/13/12 [place] Manners

## 2012-12-12 NOTE — Progress Notes (Signed)
Pt confirms pcp is Dr Ricke Hey in [facility] updated

## 2012-12-12 NOTE — ED Notes (Signed)
Pt in via EMS, per EMS- pt called c/o abd pain after eating raw chicken, unsure if pt group home is aware that patient is here, pt was not at group home when picked up and was found at some unknown apartments, pt in no distress at this time.

## 2012-12-12 NOTE — ED Notes (Signed)
Patient  Refusing zofran at this time. Stated he wanted coffee to coat his stomach. Also given a ham sandwich with cheese.

## 2012-12-12 NOTE — ED Notes (Signed)
Pts group home contacted and informed that patient was here, states that patient ran away this afternoon and they have been looking for him, patient aware that group home would be contacted. Group home states that they would send someone to hospital.

## 2012-12-12 NOTE — ED Notes (Addendum)
Per EMS, Pt c/o nausea and vomiting starting this morning x 2.  Denies pain.  Pt told EMS that he ate [employer] last night and vomited x 2 this morning.  Vitals are stable.  A & Ox4.  NAD noted.   Pt was seen at [facility] this morning for same complaint.

## 2012-12-12 NOTE — ED Notes (Signed)
Pt. Is requesting something to drink but he is complaining of stomach pain.

## 2012-12-12 NOTE — Discharge Instructions (Signed)
 Follow up with your own family physician next week for any further concerns.

## 2012-12-12 NOTE — ED Notes (Signed)
Pt reports to this RN that he began vomiting "worser" since being seen at [facility].  C/o abdominal pain.  Pain score 9/10.

## 2012-12-12 NOTE — ED Notes (Signed)
Patient ate his ham sandwich without incident and went to sleep.

## 2012-12-12 NOTE — ED Notes (Signed)
Bed: AV40 Expected date: 12/12/12 Expected time: 2:46 PM Means of arrival:  Comments: 81 male n and v

## 2012-12-13 ENCOUNTER — Emergency Department (HOSPITAL_COMMUNITY)
Admission: EM | Admit: 2012-12-13 | Discharge: 2012-12-13 | Disposition: A | Discharge status: Home / Self Care | Payer: Medicaid Other | Attending: Emergency Medicine | Admitting: Emergency Medicine

## 2012-12-13 ENCOUNTER — Encounter (HOSPITAL_COMMUNITY): Payer: Self-pay | Admitting: *Deleted

## 2012-12-13 ENCOUNTER — Emergency Department (HOSPITAL_COMMUNITY): Payer: Medicaid Other

## 2012-12-13 ENCOUNTER — Emergency Department (HOSPITAL_COMMUNITY)
Admission: EM | Admit: 2012-12-13 | Discharge: 2012-12-14 | Disposition: A | Discharge status: Home / Self Care | Payer: Medicaid Other | Source: Home / Self Care | Attending: Emergency Medicine | Admitting: Emergency Medicine

## 2012-12-13 DIAGNOSIS — F2089 Other schizophrenia: Secondary | ICD-10-CM | POA: Insufficient documentation

## 2012-12-13 DIAGNOSIS — Z79899 Other long term (current) drug therapy: Secondary | ICD-10-CM | POA: Insufficient documentation

## 2012-12-13 DIAGNOSIS — F319 Bipolar disorder, unspecified: Secondary | ICD-10-CM | POA: Insufficient documentation

## 2012-12-13 DIAGNOSIS — R112 Nausea with vomiting, unspecified: Secondary | ICD-10-CM | POA: Insufficient documentation

## 2012-12-13 DIAGNOSIS — F17200 Nicotine dependence, unspecified, uncomplicated: Secondary | ICD-10-CM | POA: Insufficient documentation

## 2012-12-13 DIAGNOSIS — G40909 Epilepsy, unspecified, not intractable, without status epilepticus: Secondary | ICD-10-CM | POA: Insufficient documentation

## 2012-12-13 DIAGNOSIS — Y9389 Activity, other specified: Secondary | ICD-10-CM | POA: Insufficient documentation

## 2012-12-13 DIAGNOSIS — S8990XA Unspecified injury of unspecified lower leg, initial encounter: Secondary | ICD-10-CM | POA: Insufficient documentation

## 2012-12-13 DIAGNOSIS — G8929 Other chronic pain: Secondary | ICD-10-CM

## 2012-12-13 DIAGNOSIS — Y92410 Unspecified street and highway as the place of occurrence of the external cause: Secondary | ICD-10-CM | POA: Insufficient documentation

## 2012-12-13 DIAGNOSIS — R630 Anorexia: Secondary | ICD-10-CM | POA: Insufficient documentation

## 2012-12-13 DIAGNOSIS — IMO0002 Reserved for concepts with insufficient information to code with codable children: Secondary | ICD-10-CM | POA: Insufficient documentation

## 2012-12-13 LAB — CBC WITH DIFFERENTIAL/PLATELET
Basophils Relative: 0 % (ref 0–1)
Eosinophils Absolute: 0.1 10*3/uL (ref 0.0–0.7)
MCH: 29.4 pg (ref 26.0–34.0)
MCHC: 31.1 g/dL (ref 30.0–36.0)
Neutrophils Relative %: 54 % (ref 43–77)
Platelets: 162 10*3/uL (ref 150–400)
RBC: 4.08 MIL/uL — ABNORMAL LOW (ref 4.22–5.81)

## 2012-12-13 LAB — COMPREHENSIVE METABOLIC PANEL
ALT: 10 U/L (ref 0–53)
AST: 22 U/L (ref 0–37)
Albumin: 3.6 g/dL (ref 3.5–5.2)
Alkaline Phosphatase: 45 U/L (ref 39–117)
Potassium: 3.5 mEq/L (ref 3.5–5.1)
Sodium: 140 mEq/L (ref 135–145)
Total Protein: 6.7 g/dL (ref 6.0–8.3)

## 2012-12-13 MED ORDER — SODIUM CHLORIDE 0.9 % IV BOLUS (SEPSIS)
500.0000 mL | Freq: Once | INTRAVENOUS | Status: AC
  Administered 2012-12-13: 500 mL via INTRAVENOUS

## 2012-12-13 MED ORDER — IBUPROFEN 800 MG PO TABS: 800.0000 mg | ORAL_TABLET | Freq: Three times a day (TID) | ORAL | Status: DC

## 2012-12-13 MED ORDER — ACETAMINOPHEN 500 MG PO TABS: 500.0000 mg | ORAL_TABLET | Freq: Four times a day (QID) | ORAL | Status: DC | PRN

## 2012-12-13 MED ORDER — IBUPROFEN 800 MG PO TABS
800.0000 mg | ORAL_TABLET | Freq: Once | ORAL | Status: AC
  Administered 2012-12-13: 800 mg via ORAL
  Filled 2012-12-13: qty 1

## 2012-12-13 MED ORDER — KETOROLAC TROMETHAMINE 60 MG/2ML IM SOLN: 60.0000 mg | Freq: Once | INTRAMUSCULAR | Status: DC

## 2012-12-13 MED ORDER — ONDANSETRON 4 MG PO TBDP: ORAL_TABLET | ORAL | Status: DC

## 2012-12-13 MED ORDER — ONDANSETRON HCL 4 MG/2ML IJ SOLN
4.0000 mg | Freq: Once | INTRAMUSCULAR | Status: AC
  Administered 2012-12-13: 4 mg via INTRAVENOUS
  Filled 2012-12-13: qty 2
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
## 2012-12-13 NOTE — ED Provider Notes (Signed)
CSN: [id number]     Arrival date & time 12/13/12  0134 History     First MD Initiated Contact with Patient 12/13/12 [phone]     Chief Complaint  Patient presents with  . Emesis   (Consider location/radiation/quality/duration/timing/severity/associated sxs/prior Treatment) HPI Comments: 27 yo male with schizoaffective disorder presents with non bilious, non bloody vomiting for two days, worse after eating.  No pain or fevers.  No known gb issues.  Denies blood in stools.  Denied rectal exam earlier today when he was in ED for the same.  Nothing has changed, vomited once since.  Nothing improves.   Patient is a 27 y.o. male presenting with vomiting. The history is provided by the patient.  Emesis Severity:  Mild Duration:  2 days Associated symptoms: no abdominal pain, no chills, no diarrhea and no headaches     Past Medical History  Diagnosis Date  . Bipolar 1 disorder   . Schizophrenia, acute   . Seizures    Past Surgical History  Procedure Laterality Date  . Abdominal surgery     History reviewed. No pertinent family history. History  Substance Use Topics  . Smoking status: Current Every Day Smoker  . Smokeless tobacco: Not on file  . Alcohol Use: No    Review of Systems  Constitutional: Positive for appetite change. Negative for fever and chills.  HENT: Negative for neck pain and neck stiffness.   Respiratory: Negative for shortness of breath.   Cardiovascular: Negative for chest pain.  Gastrointestinal: Positive for vomiting. Negative for abdominal pain, diarrhea and blood in stool.  Genitourinary: Negative for dysuria.  Musculoskeletal: Negative for back pain.  Skin: Negative for rash.  Neurological: Negative for light-headedness and headaches.    Allergies  Benadryl  Home Medications   Current Outpatient Rx  Name  Route  Sig  Dispense  Refill  . acetaminophen (TYLENOL) 650 MG CR tablet   Oral   Take 650 mg by mouth every 6 (six) hours as needed for pain.          . benztropine (COGENTIN) 2 MG tablet   Oral   Take 2 mg by mouth 2 (two) times daily.         . clonazePAM (KLONOPIN) 1 MG tablet   Oral   Take 1 mg by mouth 2 (two) times daily as needed for anxiety.         . divalproex (DEPAKOTE) 500 MG DR tablet   Oral   Take 1,000 mg by mouth 2 (two) times daily.         [name] levothyroxine (SYNTHROID, LEVOTHROID) 25 MCG tablet   Oral   Take 25 mcg by mouth daily before breakfast.         . lithium 600 MG capsule   Oral   Take 600 mg by mouth daily as needed (mood).          . LORazepam (ATIVAN) 2 MG tablet   Oral   Take 2 mg by mouth every 6 (six) hours as needed for anxiety.         [name] OLANZapine (ZYPREXA) 10 MG tablet   Oral   Take 10 mg by mouth every morning.         [name] OLANZapine (ZYPREXA) 20 MG tablet   Oral   Take 20 mg by mouth at bedtime.          [name] OLANZapine (ZYPREXA) 5 MG tablet   Oral   Take 5 mg by mouth  every 6 (six) hours as needed (for bipolar disorder).         [name] omeprazole (PRILOSEC) 20 MG capsule   Oral   Take 1 capsule (20 mg total) by mouth daily.   30 capsule   0   . pantoprazole (PROTONIX) 20 MG tablet   Oral   Take 1 tablet (20 mg total) by mouth daily.   30 tablet   0    BP 126/65  Pulse 80  Temp(Src) 98.7 F (37.1 C) (Oral)  Resp 15  SpO2 96% Physical Exam  Nursing note and vitals reviewed. Constitutional: He is oriented to person, place, and time. He appears well-developed and well-nourished.  HENT:  Head: Normocephalic and atraumatic.  Mild dry mm  Eyes: Conjunctivae are normal. Right eye exhibits no discharge. Left eye exhibits no discharge.  Neck: Normal range of motion. Neck supple. No tracheal deviation present.  Cardiovascular: Normal rate and regular rhythm.   Pulmonary/Chest: Effort normal and breath sounds normal.  Abdominal: Soft. He exhibits no distension. There is no tenderness. There is no guarding.  Musculoskeletal: He exhibits no edema.   Neurological: He is alert and oriented to person, place, and time.  Skin: Skin is warm. No rash noted.  Psychiatric: His affect is not angry and not inappropriate. He is slowed.    ED Course   Procedures (including critical care time)  Labs Reviewed  CBC WITH DIFFERENTIAL - Abnormal; Notable for the following:    RBC 4.08 (*)    Hemoglobin 12.0 (*)    HCT 38.6 (*)    Monocytes Relative 18 (*)    Monocytes Absolute 1.2 (*)    All other components within normal limits  COMPREHENSIVE METABOLIC PANEL - Abnormal; Notable for the following:    Glucose, Bld 103 (*)    All other components within normal limits   No results found. No diagnosis found.  MDM  Similar to previous episdoes.  Pt denies ulcer, bleeding or gb hx.   Plan for labs, fluids, nausea meds. Pt well appearing, no abd pain. Fup discussed  Enid Skeens, MD 12/14/12 [phone]
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
## 2012-12-14 NOTE — ED Provider Notes (Signed)
Medical screening examination/treatment/procedure(s) were performed by non-physician practitioner and as supervising physician I was immediately available for consultation/collaboration.   Junius Argyle, MD 12/14/12 1141

## 2012-12-25 ENCOUNTER — Encounter (HOSPITAL_COMMUNITY): Payer: Self-pay | Admitting: Adult Health

## 2012-12-25 ENCOUNTER — Emergency Department (HOSPITAL_COMMUNITY)
Admission: EM | Admit: 2012-12-25 | Discharge: 2012-12-26 | Disposition: A | Discharge status: Home / Self Care | Payer: Medicaid Other | Attending: Emergency Medicine | Admitting: Emergency Medicine

## 2012-12-25 DIAGNOSIS — K920 Hematemesis: Secondary | ICD-10-CM | POA: Insufficient documentation

## 2012-12-25 DIAGNOSIS — R443 Hallucinations, unspecified: Secondary | ICD-10-CM

## 2012-12-25 DIAGNOSIS — F17200 Nicotine dependence, unspecified, uncomplicated: Secondary | ICD-10-CM | POA: Insufficient documentation

## 2012-12-25 DIAGNOSIS — G40909 Epilepsy, unspecified, not intractable, without status epilepticus: Secondary | ICD-10-CM | POA: Insufficient documentation

## 2012-12-25 DIAGNOSIS — Z79899 Other long term (current) drug therapy: Secondary | ICD-10-CM | POA: Insufficient documentation

## 2012-12-25 DIAGNOSIS — R45851 Suicidal ideations: Secondary | ICD-10-CM | POA: Insufficient documentation

## 2012-12-25 DIAGNOSIS — F319 Bipolar disorder, unspecified: Secondary | ICD-10-CM | POA: Insufficient documentation

## 2012-12-25 DIAGNOSIS — F209 Schizophrenia, unspecified: Secondary | ICD-10-CM | POA: Insufficient documentation

## 2012-12-25 LAB — COMPREHENSIVE METABOLIC PANEL
ALT: 9 U/L (ref 0–53)
AST: 21 U/L (ref 0–37)
CO2: 25 mEq/L (ref 19–32)
Calcium: 9.4 mg/dL (ref 8.4–10.5)
Chloride: 105 mEq/L (ref 96–112)
GFR calc non Af Amer: >90 mL/min (ref >90)
Potassium: 3.9 mEq/L (ref 3.5–5.1)
Sodium: 141 mEq/L (ref 135–145)
Total Bilirubin: 0.2 mg/dL — ABNORMAL LOW (ref 0.3–1.2)

## 2012-12-25 LAB — CBC WITH DIFFERENTIAL/PLATELET
Basophils Absolute: 0 10*3/uL (ref 0.0–0.1)
HCT: 42 % (ref 39.0–52.0)
Hemoglobin: 13.6 g/dL (ref 13.0–17.0)
Lymphocytes Relative: 30 % (ref 12–46)
Neutro Abs: 5.3 10*3/uL (ref 1.7–7.7)
Neutrophils Relative %: 58 % (ref 43–77)
Platelets: 287 10*3/uL (ref 150–400)
RBC: 4.52 MIL/uL (ref 4.22–5.81)
RDW: 12.6 % (ref 11.5–15.5)

## 2012-12-25 LAB — ETHANOL: Alcohol, Ethyl (B): <11 mg/dL (ref 0–11)

## 2012-12-25 LAB — RAPID URINE DRUG SCREEN, HOSP PERFORMED
Cocaine: NOT DETECTED
Opiates: NOT DETECTED
Tetrahydrocannabinol: NOT DETECTED

## 2012-12-25 LAB — LITHIUM LEVEL: Lithium Lvl: 0.68 mEq/L — ABNORMAL LOW (ref 0.80–1.40)

## 2012-12-25 NOTE — ED Provider Notes (Signed)
CSN: [id number]     Arrival date & time 12/25/12  1949 History   First MD Initiated Contact with Patient 12/25/12 2216     Chief Complaint  Patient presents with  . Hematemesis  . Suicidal    HPI  [name] is a 27 y.o. male with a PMH of bipolar disorder, schizophrenia, and seizures who presents to the ED for evaluation of suicidal ideation and hematemesis. History is inconsistent between providers. Patient states that he is suicidal and his plan is to hang himself. He denies any suicide attempts prior to arrival in the emergency department including overdose. Patient states that he has not had any suicide attempts in the past. He states that he has been taking his medications with no missed doses. He states that he recently got a new psychiatrist 6 days ago and had his medications increased. Patient states he also has been hearing voices which tell him to hurt himself and others. He also states he's been seeing things that aren't there. He states that around 5 or 6 PM he developed some nausea and had 4-5 episodes of emesis. He denies any blood in his emesis however nursing notes are conflicting with this. He states that his abdominal pain has resolved. He denies any nausea currently. He states he's been having regular bowel movements with no diarrhea, constipation or blood in his stool. This is also completing with previous nursing notes. He otherwise been well with no fever, chills, headache, lightheadedness, dizziness, rhinorrhea, sore throat, chest pain, shortness of breath, cough, dysuria, hematuria, weakness, or leg edema. He lives with his uncle currently.     Past Medical History  Diagnosis Date  . Bipolar 1 disorder   . Schizophrenia, acute   . Seizures    Past Surgical History  Procedure Laterality Date  . Abdominal surgery     History reviewed. No pertinent family history. History  Substance Use Topics  . Smoking status: Current Every Day Smoker  . Smokeless tobacco: Not on  file  . Alcohol Use: No    Review of Systems  Constitutional: Negative for fever, chills, activity change, appetite change and fatigue.  HENT: Negative for congestion, sore throat, rhinorrhea, neck pain and neck stiffness.   Eyes: Negative for photophobia, pain and visual disturbance.  Respiratory: Negative for cough and shortness of breath.   Cardiovascular: Negative for chest pain, palpitations and leg swelling.  Gastrointestinal: Positive for abdominal pain (Resolved). Negative for nausea, vomiting, diarrhea and blood in stool.  Genitourinary: Negative for dysuria, hematuria and penile pain.  Musculoskeletal: Negative for back pain.  Skin: Negative for wound.  Neurological: Negative for dizziness, syncope, weakness, numbness and headaches.  Psychiatric/Behavioral: Positive for suicidal ideas and hallucinations. Negative for confusion. The patient is not nervous/anxious.     Allergies  Benadryl  Home Medications   Current Outpatient Rx  Name  Route  Sig  Dispense  Refill  . benztropine (COGENTIN) 2 MG tablet   Oral   Take 2 mg by mouth 2 (two) times daily.         . clonazePAM (KLONOPIN) 1 MG tablet   Oral   Take 1 mg by mouth 2 (two) times daily as needed for anxiety.         . divalproex (DEPAKOTE) 500 MG DR tablet   Oral   Take 1,000 mg by mouth 2 (two) times daily.         [name] ibuprofen (ADVIL,MOTRIN) 800 MG tablet   Oral   Take  1 tablet (800 mg total) by mouth 3 (three) times daily.   21 tablet   0   . levothyroxine (SYNTHROID, LEVOTHROID) 25 MCG tablet   Oral   Take 25 mcg by mouth daily before breakfast.         . lithium 600 MG capsule   Oral   Take 600 mg by mouth 2 (two) times daily.          [name] LORazepam (ATIVAN) 2 MG tablet   Oral   Take 2 mg by mouth every 6 (six) hours as needed for anxiety.         [name] omeprazole (PRILOSEC) 20 MG capsule   Oral   Take 1 capsule (20 mg total) by mouth daily.   30 capsule   0    BP 132/76  Pulse 96   Temp(Src) 98.2 F (36.8 C) (Oral)  Resp 20  SpO2 98%  Filed Vitals:   12/26/12 0030 12/26/12 0130 12/26/12 0755 12/26/12 1159  BP: 126/60 116/54 120/68 110/72  Pulse: 66 64 72 71  Temp:   98 F (36.7 C) 97.9 F (36.6 C)  TempSrc:   Oral Oral  Resp:   18 16  SpO2: 99% 95% 98% 97%     Physical Exam  Nursing note and vitals reviewed. Constitutional: He is oriented to person, place, and time. He appears well-developed and well-nourished. No distress.  HENT:  Head: Normocephalic and atraumatic.  Right Ear: External ear normal.  Left Ear: External ear normal.  Nose: Nose normal.  Mouth/Throat: Oropharynx is clear and moist. No oropharyngeal exudate.  Eyes: Conjunctivae are normal. Pupils are equal, round, and reactive to light. Right eye exhibits no discharge. Left eye exhibits no discharge.  Neck: Normal range of motion. Neck supple.  Cardiovascular: Normal rate, regular rhythm, normal heart sounds and intact distal pulses.  Exam reveals no gallop and no friction rub.   No murmur heard. Pulmonary/Chest: Effort normal and breath sounds normal. No respiratory distress. He has no wheezes. He has no rales. He exhibits no tenderness.  Abdominal: Soft. Bowel sounds are normal. He exhibits no distension and no mass. There is no tenderness. There is no rebound and no guarding.  Musculoskeletal: Normal range of motion. He exhibits no edema and no tenderness.  Neurological: He is alert and oriented to person, place, and time.  Skin: Skin is warm and dry. He is not diaphoretic.    ED Course  Procedures (including critical care time) Labs Review Labs Reviewed  CBC WITH DIFFERENTIAL  [name]  URINE RAPID DRUG SCREEN (HOSP PERFORMED)  COMPREHENSIVE METABOLIC PANEL  ACETAMINOPHEN LEVEL  SALICYLATE LEVEL  URINALYSIS, ROUTINE W REFLEX MICROSCOPIC   Imaging Review No results found.  Results for orders placed during the hospital encounter of 12/25/12  [name]      Result Value Range    Alcohol, Ethyl (B) <11  0 - 11 mg/dL  URINE RAPID DRUG SCREEN (HOSP PERFORMED)      Result Value Range   Opiates NONE DETECTED  NONE DETECTED   Cocaine NONE DETECTED  NONE DETECTED   Benzodiazepines NONE DETECTED  NONE DETECTED   Amphetamines NONE DETECTED  NONE DETECTED   Tetrahydrocannabinol NONE DETECTED  NONE DETECTED   Barbiturates NONE DETECTED  NONE DETECTED  CBC WITH DIFFERENTIAL      Result Value Range   WBC 9.1  4.0 - 10.5 K/uL   RBC 4.52  4.22 - 5.81 MIL/uL   Hemoglobin 13.6  13.0 - 17.0 g/dL  HCT 42.0  39.0 - 52.0 %   MCV 92.9  78.0 - 100.0 fL   MCH 30.1  26.0 - 34.0 pg   MCHC 32.4  30.0 - 36.0 g/dL   RDW 47.8  29.5 - 62.1 %   Platelets 287  150 - 400 K/uL   Neutrophils Relative % 58  43 - 77 %   Neutro Abs 5.3  1.7 - 7.7 K/uL   Lymphocytes Relative 30  12 - 46 %   Lymphs Abs 2.8  0.7 - 4.0 K/uL   Monocytes Relative 10  3 - 12 %   Monocytes Absolute 0.9  0.1 - 1.0 K/uL   Eosinophils Relative 2  0 - 5 %   Eosinophils Absolute 0.2  0.0 - 0.7 K/uL   Basophils Relative 0  0 - 1 %   Basophils Absolute 0.0  0.0 - 0.1 K/uL  COMPREHENSIVE METABOLIC PANEL      Result Value Range   Sodium 141  135 - 145 mEq/L   Potassium 3.9  3.5 - 5.1 mEq/L   Chloride 105  96 - 112 mEq/L   CO2 25  19 - 32 mEq/L   Glucose, Bld 98  70 - 99 mg/dL   BUN 14  6 - 23 mg/dL   Creatinine, Ser 3.08  0.50 - 1.35 mg/dL   Calcium 9.4  8.4 - 65.7 mg/dL   Total Protein 7.3  6.0 - 8.3 g/dL   Albumin 3.9  3.5 - 5.2 g/dL   AST 21  0 - 37 U/L   ALT 9  0 - 53 U/L   Alkaline Phosphatase 44  39 - 117 U/L   Total Bilirubin 0.2 (*) 0.3 - 1.2 mg/dL   GFR calc non Af Amer >90  >90 mL/min   GFR calc Af Amer >90  >90 mL/min  ACETAMINOPHEN LEVEL      Result Value Range   Acetaminophen (Tylenol), Serum <15.0  10 - 30 ug/mL  SALICYLATE LEVEL      Result Value Range   Salicylate Lvl <2.0 (*) 2.8 - 20.0 mg/dL  LITHIUM LEVEL      Result Value Range   Lithium Lvl 0.68 (*) 0.80 - 1.40 mEq/L    MDM  No  diagnosis found.  Nina Hoar is a 27 y.o. male with a PMH of bipolar disorder, schizophrenia, and seizures who presents to the ED for evaluation of suicidal ideation and hematemesis.  Lithium, UA, acetaminophen, salicylate level, ethanol, drug screen, CBC, CMP ordered to further evaluate.     Rechecks  11:45 PM = Patient asking for food and something to drink.  No acute distress.     Patient was evaluated in the ED for suicidal ideations.  He was consulted by the ACT team.  He will transferred to the psych ED holding until a plan can be established by the ACT team.    Patient has multiple ED visits for the complaint of hematemesis and hematochezia.  He complained of this to nursing staff however denied this to me on several occasions.  His abdominal exam was benign and he has had no emesis throughout his ED visit.  His H&H are stable.    2:17 PM = care will be transferred to Dr. Elesa Massed   Final impressions: 1. Suicidal ideation 2. Hallucinations, auditory and visual     Luiz Iron PA-C   This patient was discussed with Dr. Arloa Koh, PA-C 12/26/12  1611 

## 2012-12-25 NOTE — ED Notes (Signed)
Pt. reported that he is hearing voices to kill himself while at the waiting area this evening , pt. did not specify plan of suicide . Charge nurse notified for pt.'s sitter and security to wand pt.

## 2012-12-25 NOTE — ED Notes (Addendum)
Patient states that now that he is "hearing voices that tells him to kill himself."  Patient brought back to triage area for re evaluation

## 2012-12-25 NOTE — ED Notes (Addendum)
Pt seen here and [facility] multiple times over the last month for same complaint. Reports vomiting chunks of blood and abdominal pain. "i probably threw up a while ago" c/o generalized abdominal pain. In no distress, VSS. Denies diarrhea. Reports blood in stool, small amount.

## 2012-12-25 NOTE — ED Notes (Signed)
Called house coverage and notified about needing a sitter for patient.

## 2012-12-26 MED ORDER — ACETAMINOPHEN 325 MG PO TABS: 650.0000 mg | ORAL_TABLET | ORAL | Status: DC | PRN

## 2012-12-26 MED ORDER — DIVALPROEX SODIUM 250 MG PO DR TAB
1000.0000 mg | DELAYED_RELEASE_TABLET | Freq: Two times a day (BID) | ORAL | Status: DC
  Administered 2012-12-26: 1000 mg via ORAL
  Filled 2012-12-26: qty 4

## 2012-12-26 MED ORDER — ONDANSETRON HCL 4 MG PO TABS: 4.0000 mg | ORAL_TABLET | Freq: Three times a day (TID) | ORAL | Status: DC | PRN

## 2012-12-26 MED ORDER — LEVOTHYROXINE SODIUM 25 MCG PO TABS
25.0000 ug | ORAL_TABLET | Freq: Every day | ORAL | Status: DC
  Administered 2012-12-26: 25 ug via ORAL
  Filled 2012-12-26 (×2): qty 1

## 2012-12-26 MED ORDER — LITHIUM CARBONATE 600 MG PO CAPS: 600.0000 mg | ORAL_CAPSULE | Freq: Two times a day (BID) | ORAL | Status: DC

## 2012-12-26 MED ORDER — CLONAZEPAM 0.5 MG PO TABS: 1.0000 mg | ORAL_TABLET | Freq: Two times a day (BID) | ORAL | Status: DC | PRN

## 2012-12-26 MED ORDER — DIVALPROEX SODIUM 500 MG PO DR TAB: 1000.0000 mg | DELAYED_RELEASE_TABLET | Freq: Two times a day (BID) | ORAL | Status: DC

## 2012-12-26 MED ORDER — LEVOTHYROXINE SODIUM 50 MCG PO TABS: 25.0000 ug | ORAL_TABLET | Freq: Every day | ORAL | Status: DC

## 2012-12-26 MED ORDER — PANTOPRAZOLE SODIUM 40 MG PO TBEC
40.0000 mg | DELAYED_RELEASE_TABLET | Freq: Every day | ORAL | Status: DC
  Administered 2012-12-26: 40 mg via ORAL
  Filled 2012-12-26: qty 1

## 2012-12-26 MED ORDER — LORAZEPAM 1 MG PO TABS: 1.0000 mg | ORAL_TABLET | Freq: Three times a day (TID) | ORAL | Status: DC | PRN

## 2012-12-26 MED ORDER — CLONAZEPAM 1 MG PO TABS: 1.0000 mg | ORAL_TABLET | Freq: Two times a day (BID) | ORAL | Status: DC | PRN

## 2012-12-26 MED ORDER — BENZTROPINE MESYLATE 1 MG PO TABS
2.0000 mg | ORAL_TABLET | Freq: Two times a day (BID) | ORAL | Status: DC
  Administered 2012-12-26: 2 mg via ORAL
  Filled 2012-12-26: qty 2

## 2012-12-26 MED ORDER — LITHIUM CARBONATE 300 MG PO CAPS
600.0000 mg | ORAL_CAPSULE | Freq: Two times a day (BID) | ORAL | Status: DC
  Administered 2012-12-26: 600 mg via ORAL
  Filled 2012-12-26: qty 2

## 2012-12-26 MED ORDER — OMEPRAZOLE 20 MG PO CPDR: 20.0000 mg | DELAYED_RELEASE_CAPSULE | Freq: Every day | ORAL | Status: DC

## 2012-12-26 MED ORDER — LORAZEPAM 1 MG PO TABS
2.0000 mg | ORAL_TABLET | Freq: Four times a day (QID) | ORAL | Status: DC | PRN
  Administered 2012-12-26: 2 mg via ORAL
  Filled 2012-12-26: qty 2

## 2012-12-26 MED ORDER — NICOTINE 21 MG/24HR TD PT24
21.0000 mg | MEDICATED_PATCH | Freq: Every day | TRANSDERMAL | Status: DC
  Filled 2012-12-26: qty 1

## 2012-12-26 MED ORDER — BENZTROPINE MESYLATE 2 MG PO TABS: 2.0000 mg | ORAL_TABLET | Freq: Two times a day (BID) | ORAL | Status: DC

## 2012-12-26 NOTE — ED Provider Notes (Signed)
2:55 AM  Assumed care.  Patient is a 27 y.o. male with history of bipolar disorder on lithium and schizophrenia with multiple psychiatric admissions to presents with suicidal ideation with a plan to hang himself as well as command auditory hallucinations and visual hallucinations. He is medically cleared and a waiting psychiatry disposition.  4:30 AM  Pt has spoke with Berna Spare with [facility].  Patient now denies SI or HI. He reports that sometimes when he gets angry he wants to "smack somebody". Denies any auditory hallucinations.  Plan is to reassess patient in the morning and attempt to get patient back to his group home.  Layla Maw Orlan [employer], DO 12/26/12 [phone]

## 2012-12-26 NOTE — ED Notes (Addendum)
Ignore previous nurses note - error in charting.

## 2012-12-26 NOTE — ED Provider Notes (Signed)
Care assumed by me at 0800.  Per Dr. Elesa Massed (offgoing EMP) pt is pending disposition back to group home.   BP 120/68  Pulse 72  Temp(Src) 98 F (36.7 C) (Oral)  Resp 18  SpO2 98% No issues reported.    Patient transferred to group home in [place]. Transfer coronary by case management. Patient agrees to this disposition. No issues on discharge he is without any distress. Medications were refilled for patient prior to discharge.  Darlys Gales, MD 12/26/12 [phone]

## 2012-12-26 NOTE — Progress Notes (Signed)
Covering Clinical Social Worker (CSW) informed that pt's group home closed as of Monday and pt does not have a place to stay. CSW contacted Migdalia Dk [facility] [phone] in [place] who stated that she could provide placement for pt today. CSW preparing pt FL2, RNCM and RN aware. Group Home representative Malachi Bonds to pick up pt today at 12pm.  Theresia Bough, MSW, LCSW [phone]

## 2012-12-26 NOTE — ED Notes (Addendum)
Patient given ativan due to anxiety,. He is expressing concern if he is going to be picked up. concern about his check at old group home, reasurred pt that we have called steve and left a message

## 2012-12-26 NOTE — ED Notes (Signed)
Standby assist pt ambulated from POD A to POD C. Safety at bedside.

## 2012-12-26 NOTE — ED Notes (Signed)
Berna Spare from [facility] called and states he is ready to perform telepsych with patient. Telepsych currently in process.

## 2012-12-26 NOTE — BH Assessment (Addendum)
Tele Assessment Note   [name] is an 27 y.o. male.  Patient was brought to [facility] initially because of stomach upset and making statements about wanting to harm self.  Patient was very drowsy during interview and had to be aroused several times.  When asked about whether he wanted to kill himself he responds, "I'm not really sure about that."  He does say that he sometimes feels like "smacking people."  But he denies any HI or plan to harm anyone.  Patient denies currently a plan or intention to harm himself.  Patient does say that he hears voices that tell him to do things.  He also sees demons.  When asked if he usually heard or saw things he responds yes.  Patient has a baseline of hallucinations and said that today it was not as bad as it was 2-3 weeks ago.  Patient says that he lives in a group home.  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  Patient reports that the group home is moving and that he is the last person living there.  He is uncertain about where he is supposed to be going.  Patient does have some increased anxiety related to this group home move.  Clinician did try to call the number to the guardian that is on file with registration but the number is no longer a working number.  Pt does not have any family support, his mother lives in [place].  This clinician did talk to Dr. Elesa Massed and she is in agreement with patient returning to the group home.  However if group home has compelling information to warrant trying to find inpatient placement, please call [facility] at [phone].  Nursing staff will need to try to locate a phone number for the group home which works. Axis I: Schizoaffective Disorder Axis II: Deferred Axis III:  Past Medical History  Diagnosis Date  . Bipolar 1 disorder   . Schizophrenia, acute   . Seizures    Axis IV: housing problems, other psychosocial or environmental problems and problems with primary support group Axis  V: 51-60 moderate symptoms  Past Medical History:  Past Medical History  Diagnosis Date  . Bipolar 1 disorder   . Schizophrenia, acute   . Seizures     Past Surgical History  Procedure Laterality Date  . Abdominal surgery      Family History: History reviewed. No pertinent family history.  Social History:  reports that he has been smoking.  He does not have any smokeless tobacco history on file. He reports that he does not drink alcohol or use illicit drugs.  Additional Social History:  Alcohol / Drug Use Pain Medications: See PTA medication list Prescriptions: See PTA medication list Over the Counter: See PTA medication list History of alcohol / drug use?: No history of alcohol / drug abuse  CIWA: CIWA-Ar BP: 116/54 mmHg Pulse Rate: 64 COWS:    Allergies:  Allergies  Allergen Reactions  . Benadryl [Diphenhydramine Hcl] Other (See Comments)    Home Medications:  (Not in a hospital admission)  OB/GYN Status:  No LMP for male patient.  General Assessment Data Location of Assessment: [facility] ED Is this a Tele or Face-to-Face Assessment?: Tele Assessment Is this an Initial Assessment or a Re-assessment for this encounter?: Initial Assessment Living Arrangements: Other (Comment) (Pt living in a group home) Can pt return to current living arrangement?: Yes Admission Status: Voluntary Is patient capable of  signing voluntary admission?: Yes Transfer from: Group Home Referral Source: Other [facility] staff)     [facility] Crisis Care Plan Living Arrangements: Other (Comment) (Pt living in a group home) Name of Psychiatrist: [facility] Name of Therapist: None     Risk to self Suicidal Ideation: No Suicidal Intent: No Is patient at risk for suicide?: No Suicidal Plan?: No Access to Means: No What has been your use of drugs/alcohol within the last 12 months?: Denies use Previous Attempts/Gestures:  (Unknown) How many times?:  (Unknown) Other Self Harm Risks: N/A Triggers  for Past Attempts: None known Intentional Self Injurious Behavior: None Family Suicide History: Unknown Recent stressful life event(s): Other (Comment) (Moving out of GH into another one) Persecutory voices/beliefs?: No Depression: Yes Depression Symptoms: Loss of interest in usual pleasures Substance abuse history and/or treatment for substance abuse?: No Suicide prevention information given to non-admitted patients: Not applicable  Risk to Others Homicidal Ideation: No-Not Currently/Within Last 6 Months Thoughts of Harm to Others: No-Not Currently Present/Within Last 6 Months Current Homicidal Intent: No Current Homicidal Plan: No Access to Homicidal Means: No Identified Victim: No one History of harm to others?:  (Unknown) Assessment of Violence: None Noted Violent Behavior Description:  (Unknown, pt was very drowsy) Does patient have access to weapons?: No Criminal Charges Pending?: No Does patient have a court date: No  Psychosis Hallucinations: Visual;Auditory;With command (Baseline-sees demons, voices telling him to do things) Delusions: None noted  Mental Status Report Appear/Hygiene:  (Casual) Eye Contact: Poor (Pt very drowsy) Motor Activity: Freedom of movement;Unremarkable Speech: Logical/coherent Level of Consciousness: Drowsy Mood: Depressed Affect: Sad Anxiety Level: Moderate Thought Processes: Coherent Judgement: Unimpaired Orientation: Person;Place;Situation Obsessive Compulsive Thoughts/Behaviors: None  Cognitive Functioning Concentration: Decreased Memory: Recent Impaired;Remote Impaired IQ: Below Average Level of Function: Mild Insight: Fair Impulse Control: Good Appetite: Good Weight Loss: 0 Weight Gain: 0 Sleep: No Change Total Hours of Sleep:  (6-8 hours) Vegetative Symptoms: None  ADLScreening [facility] Assessment Services) Patient's cognitive ability adequate to safely complete daily activities?: Yes (Pt does live in group home.) Patient  able to express need for assistance with ADLs?: Yes Independently performs ADLs?: Yes (appropriate for developmental age)  Prior Inpatient Therapy Prior Inpatient Therapy: Yes Prior Therapy Dates: Pt cannot recall Prior Therapy Facilty/Provider(s): Pt cannot recall Reason for Treatment: Unknown  Prior Outpatient Therapy Prior Outpatient Therapy: Yes Prior Therapy Dates: Over one year Prior Therapy Facilty/Provider(s): [facility] Reason for Treatment: med management  ADL Screening (condition at time of admission) Patient's cognitive ability adequate to safely complete daily activities?: Yes (Pt does live in group home.) Is the patient deaf or have difficulty hearing?: No Does the patient have difficulty seeing, even when wearing glasses/contacts?: No Does the patient have difficulty concentrating, remembering, or making decisions?: Yes Patient able to express need for assistance with ADLs?: Yes Does the patient have difficulty dressing or bathing?: No Independently performs ADLs?: Yes (appropriate for developmental age) Does the patient have difficulty walking or climbing stairs?: No Weakness of Legs: None Weakness of Arms/Hands: None  Home Assistive Devices/Equipment Home Assistive Devices/Equipment: None    Abuse/Neglect Assessment (Assessment to be complete while patient is alone) Physical Abuse: Denies Verbal Abuse: Denies Sexual Abuse: Denies Exploitation of patient/patient's resources: Denies Self-Neglect: Denies Values / Beliefs Cultural Requests During Hospitalization: None Spiritual Requests During Hospitalization: None   Advance Directives (For Healthcare) Advance Directive: Patient does not have advance directive;Patient would not like information    Additional Information 1:1 In Past 12 Months?: No CIRT  Risk: No Elopement Risk: No Does patient have medical clearance?: Yes     Disposition:  Disposition Initial Assessment Completed for this Encounter:  Yes Disposition of Patient: Referred to;Other dispositions Other disposition(s): To current provider Patient referred to:  (Pt to return to gh.  Psychosis is at baseline, denies curren)  Beatriz Stallion Ray 12/26/2012 3:10 AM

## 2012-12-26 NOTE — ED Notes (Addendum)
We have called all phone numbers in pt chart they are disconnected. Called [facility] to get them to go to the group home. [employer] had a phone number for group home. [phone]. This number did work.  Per steve the state has suspended their liscense since Monday. They have no placement for him. Brett Canales also reports that they cannot take him back. Brett Canales states that [facility] freqently "runs off". Brett Canales reports pt walked away from group home yesterday. Brett Canales states they did not know cass was at the hospital. Brett Canales reports Shelly Coss has been trying to placement

## 2012-12-26 NOTE — ED Notes (Signed)
Spoke with ms [facility]ucker. She is at [facility] with a client that  Is having surgery. She will be coming to pick up pt as soon as she can leave [facility]

## 2012-12-26 NOTE — ED Notes (Signed)
Social worker and case manager made aware of pt housing issue.

## 2012-12-27 NOTE — ED Provider Notes (Signed)
Medical screening examination/treatment/procedure(s) were performed by non-physician practitioner and as supervising physician I was immediately available for consultation/collaboration.  Alister Staver L Mayan Kloepfer, MD 12/27/12 1350 

## 2013-01-21 ENCOUNTER — Emergency Department (HOSPITAL_COMMUNITY)
Admission: EM | Admit: 2013-01-21 | Discharge: 2013-01-25 | Disposition: A | Discharge status: Home / Self Care | Payer: Medicaid Other | Attending: Emergency Medicine | Admitting: Emergency Medicine

## 2013-01-21 ENCOUNTER — Encounter (HOSPITAL_COMMUNITY): Payer: Self-pay

## 2013-01-21 DIAGNOSIS — Z79899 Other long term (current) drug therapy: Secondary | ICD-10-CM | POA: Insufficient documentation

## 2013-01-21 DIAGNOSIS — F250 Schizoaffective disorder, bipolar type: Secondary | ICD-10-CM

## 2013-01-21 DIAGNOSIS — Z046 Encounter for general psychiatric examination, requested by authority: Secondary | ICD-10-CM | POA: Insufficient documentation

## 2013-01-21 DIAGNOSIS — F209 Schizophrenia, unspecified: Secondary | ICD-10-CM | POA: Insufficient documentation

## 2013-01-21 DIAGNOSIS — G40909 Epilepsy, unspecified, not intractable, without status epilepticus: Secondary | ICD-10-CM | POA: Insufficient documentation

## 2013-01-21 DIAGNOSIS — R45850 Homicidal ideations: Secondary | ICD-10-CM | POA: Insufficient documentation

## 2013-01-21 DIAGNOSIS — F319 Bipolar disorder, unspecified: Secondary | ICD-10-CM | POA: Insufficient documentation

## 2013-01-21 DIAGNOSIS — F411 Generalized anxiety disorder: Secondary | ICD-10-CM | POA: Insufficient documentation

## 2013-01-21 DIAGNOSIS — F17200 Nicotine dependence, unspecified, uncomplicated: Secondary | ICD-10-CM | POA: Insufficient documentation

## 2013-01-21 DIAGNOSIS — R45851 Suicidal ideations: Secondary | ICD-10-CM | POA: Insufficient documentation

## 2013-01-21 DIAGNOSIS — K219 Gastro-esophageal reflux disease without esophagitis: Secondary | ICD-10-CM | POA: Insufficient documentation

## 2013-01-21 HISTORY — DX: Anxiety disorder, unspecified: F41.9

## 2013-01-21 HISTORY — DX: Gastro-esophageal reflux disease without esophagitis: K21.9

## 2013-01-21 LAB — CBC
HCT: 45.3 % (ref 39.0–52.0)
Hemoglobin: 15 g/dL (ref 13.0–17.0)
MCHC: 33.1 g/dL (ref 30.0–36.0)
MCV: 92.3 fL (ref 78.0–100.0)
Platelets: 251 10*3/uL (ref 150–400)
RBC: 4.91 MIL/uL (ref 4.22–5.81)
RDW: 12.7 % (ref 11.5–15.5)

## 2013-01-21 LAB — BASIC METABOLIC PANEL
BUN: 11 mg/dL (ref 6–23)
Creatinine, Ser: 0.98 mg/dL (ref 0.50–1.35)
GFR calc Af Amer: >90 mL/min (ref >90)
GFR calc non Af Amer: >90 mL/min (ref >90)
Glucose, Bld: 96 mg/dL (ref 70–99)
Potassium: 3.6 mEq/L (ref 3.5–5.1)

## 2013-01-21 LAB — LITHIUM LEVEL: Lithium Lvl: 0.52 mEq/L — ABNORMAL LOW (ref 0.80–1.40)

## 2013-01-21 LAB — ETHANOL: Alcohol, Ethyl (B): <11 mg/dL (ref 0–11)

## 2013-01-21 LAB — RAPID URINE DRUG SCREEN, HOSP PERFORMED
Amphetamines: NOT DETECTED
Barbiturates: NOT DETECTED
Benzodiazepines: NOT DETECTED

## 2013-01-21 LAB — VALPROIC ACID LEVEL: Valproic Acid Lvl: 126.9 ug/mL — ABNORMAL HIGH (ref 50.0–100.0)

## 2013-01-21 MED ORDER — ZOLPIDEM TARTRATE 5 MG PO TABS
5.0000 mg | ORAL_TABLET | Freq: Every evening | ORAL | Status: DC | PRN
  Administered 2013-01-21 – 2013-01-24 (×4): 5 mg via ORAL
  Filled 2013-01-21 (×3): qty 1

## 2013-01-21 MED ORDER — DIVALPROEX SODIUM 250 MG PO DR TAB
1000.0000 mg | DELAYED_RELEASE_TABLET | Freq: Two times a day (BID) | ORAL | Status: DC
  Administered 2013-01-21 – 2013-01-25 (×8): 1000 mg via ORAL
  Filled 2013-01-21 (×8): qty 4

## 2013-01-21 MED ORDER — ACETAMINOPHEN 325 MG PO TABS
650.0000 mg | ORAL_TABLET | ORAL | Status: DC | PRN
  Administered 2013-01-24: 650 mg via ORAL
  Filled 2013-01-21: qty 2

## 2013-01-21 MED ORDER — PANTOPRAZOLE SODIUM 40 MG PO TBEC
40.0000 mg | DELAYED_RELEASE_TABLET | Freq: Every day | ORAL | Status: DC
  Administered 2013-01-21 – 2013-01-24 (×3): 40 mg via ORAL
  Filled 2013-01-21 (×2): qty 1

## 2013-01-21 MED ORDER — BENZTROPINE MESYLATE 1 MG PO TABS
2.0000 mg | ORAL_TABLET | Freq: Two times a day (BID) | ORAL | Status: DC
  Administered 2013-01-21 – 2013-01-25 (×9): 2 mg via ORAL
  Filled 2013-01-21 (×9): qty 2

## 2013-01-21 MED ORDER — DIVALPROEX SODIUM 250 MG PO DR TAB: 1000.0000 mg | DELAYED_RELEASE_TABLET | Freq: Two times a day (BID) | ORAL | Status: DC

## 2013-01-21 MED ORDER — LORAZEPAM 1 MG PO TABS
1.0000 mg | ORAL_TABLET | Freq: Three times a day (TID) | ORAL | Status: DC | PRN
  Administered 2013-01-22: 1 mg via ORAL
  Filled 2013-01-21: qty 1

## 2013-01-21 MED ORDER — LEVOTHYROXINE SODIUM 25 MCG PO TABS
25.0000 ug | ORAL_TABLET | Freq: Every day | ORAL | Status: DC
  Administered 2013-01-22 – 2013-01-25 (×4): 25 ug via ORAL
  Filled 2013-01-21 (×8): qty 1

## 2013-01-21 MED ORDER — ONDANSETRON HCL 4 MG PO TABS: 4.0000 mg | ORAL_TABLET | Freq: Three times a day (TID) | ORAL | Status: DC | PRN

## 2013-01-21 MED ORDER — IBUPROFEN 400 MG PO TABS: 600.0000 mg | ORAL_TABLET | Freq: Three times a day (TID) | ORAL | Status: DC | PRN

## 2013-01-21 MED ORDER — LITHIUM CARBONATE 300 MG PO CAPS
600.0000 mg | ORAL_CAPSULE | Freq: Two times a day (BID) | ORAL | Status: DC
  Administered 2013-01-21 – 2013-01-25 (×8): 600 mg via ORAL
  Filled 2013-01-21 (×15): qty 2

## 2013-01-21 NOTE — BH Assessment (Signed)
Assessment Note  [name] is an 27 y.o. male diagnosed with Bipolar and Schizophrenia brought in by caregiver to the Emergency Department complaining of aggressive behavior, homicidal threats, and SI. Per ED notes, "pt lives at [facility] assisted living facility and was sent to the hospital by staff at his home for attempting to fight older residents and threatening to shoot other residents of the home." He states that he hears voices telling him to kill himself and others. He states he is told to kill "some others that I don't like" including other residents at his group home. Pt states he is taking his medications as instructed. He has not seen his psychiatrist since moving from [place] to [place] in the middle of September.   During the tele assessment patient continues to report SI and HI towards others residing with whom he lives resides. Says that he doesn't know why he feels that way but is unable to contract for safety. He admits to feeling impulsive. He has a history of fighting other residence when he feels this way, therefore; wanted to seek help before he acted out towards others. Patient also has a history of Auditory Hallucinations, no current symptoms. He reports on/off command Hallucinations. Last incident as 2 days ago.  No Visuals hallucinations reported.   Patient does not have a current provider (psychiatrist, therapist, etc.). He is however compliant with medications.   Additional Notes:  Pt was at a home in [place] until they closed down. He now lives in [facility].  [name]- director # [phone]. Patient is his own legal guardian. [name] sts that patient asked to come to the ED for an evaluation yesterday complaining of suicidal and homicidal thoughts.  Sts he has lived in the rest home x1 month. He was calm and cooperative the first 3-4 days of living in the rest home. Patient has become increasingly aggravated over the past several  weeks.  Yesterday patient became paranoid and angry due to another resident looking him. He is easily triggered by the residents in his home. Pt reacts to these triggers by yelling.  Patient is compliant with all prescribed meds (SEE MAR) but staff are concerned that patient's meds are not working.   Per [name] (rest home director) # [phone] patient does not have a steady psychiatrist at this time. He has an appointment with "Somewhere  in [place]"  towards the end of October 2014. [name] unable to recall name of the facility and/or provider. The rest home is also working on setting him up with [facility] in [place], [place].  [name] says that patient has been admitted to psychiatric hospitals in the past, however; does not know name of facilities, dates, and reason for admissions.  Group home owner willing to take patient back only if he is stable. Says that patient must be calm if he returns stating other residents are afraid of him.   Axis I: Schizophrenia, Paranoid; Bipolar I Disorder,  and Mood Disorder  Axis II: Deferred Axis III:  Past Medical History  Diagnosis Date  . Bipolar 1 disorder   . Schizophrenia, acute   . Seizures   . Acid reflux   . Anxiety    Axis IV: other psychosocial or environmental problems, problems related to social environment, problems with access to health care services and problems with primary support group Axis V: 31-40 impairment in reality testing  Past Medical History:  Past Medical History  Diagnosis Date  . Bipolar 1  disorder   . Schizophrenia, acute   . Seizures   . Acid reflux   . Anxiety     Past Surgical History  Procedure Laterality Date  . Abdominal surgery      Family History: No family history on file.  Social History:  reports that he has been smoking.  He does not have any smokeless tobacco history on file. He reports that  drinks alcohol. He reports that he does not use illicit drugs.  Additional Social  History:     CIWA: CIWA-Ar BP: 113/60 mmHg Pulse Rate: 64 COWS:    Allergies:  Allergies  Allergen Reactions  . Benadryl [Diphenhydramine Hcl] Other (See Comments)    Home Medications:  (Not in a hospital admission)  OB/GYN Status:  No LMP for male patient.  General Assessment Data Location of Assessment: [facility] ED Is this a Tele or Face-to-Face Assessment?: Tele Assessment Is this an Initial Assessment or a Re-assessment for this encounter?: Initial Assessment Living Arrangements: Other (Comment) Can pt return to current living arrangement?: Yes Admission Status: Voluntary Is patient capable of signing voluntary admission?: Yes Transfer from: Acute Hospital Referral Source: Self/Family/Friend     [facility] Crisis Care Plan Living Arrangements: Other (Comment) Name of Psychiatrist:  ("patient has an appt. w/ a provider iin [place]") Name of Therapist: None (Pending an appointment at [facility] in [place], [place])  Education Status Is patient currently in school?: No Current Grade:  (n/a) Highest grade of school patient has completed:  (n/a) Name of school:  (n/a) Contact person:  (n/a)  Risk to self Suicidal Ideation: Yes-Currently Present Suicidal Intent: Yes-Currently Present Is patient at risk for suicide?: Yes Suicidal Plan?: Yes-Currently Present (hang self ) Specify Current Suicidal Plan:  (hang self ) Access to Means: No What has been your use of drugs/alcohol within the last 12 months?:  (n/a) Previous Attempts/Gestures: Yes How many times?:  ("many") Other Self Harm Risks:  (none reported) Triggers for Past Attempts: Other (Comment) (no specified reason) Intentional Self Injurious Behavior: None Family Suicide History: Unknown Recent stressful life event(s): Other (Comment) Persecutory voices/beliefs?: No Depression: No Depression Symptoms: Feeling angry/irritable;Feeling worthless/self pity;Loss of interest in usual pleasures;Fatigue Substance  abuse history and/or treatment for substance abuse?: No Suicide prevention information given to non-admitted patients: Not applicable  Risk to Others Homicidal Ideation: Yes-Currently Present Thoughts of Harm to Others: Yes-Currently Present Comment - Thoughts of Harm to Others:  (Patient reports thoughts of wanting to harm other residents) Current Homicidal Intent: Yes-Currently Present Current Homicidal Plan: Yes-Currently Present Describe Current Homicidal Plan:  (fight other residents in rest home) Access to Homicidal Means: No Identified Victim:  (residents in rest home) History of harm to others?: Yes Assessment of Violence: In past 6-12 months (fought residents at last residence) Violent Behavior Description:  (patient is calm and cooperative currently) Does patient have access to weapons?: No Criminal Charges Pending?: No Does patient have a court date: No  Psychosis Hallucinations: None noted (on/off auditory hallucinations (command); no current sx's) Delusions: None noted  Mental Status Report Appear/Hygiene: Disheveled Eye Contact: Good Motor Activity: Freedom of movement Speech: Logical/coherent Level of Consciousness: Drowsy Mood: Depressed Affect: Sad Anxiety Level: None Thought Processes: Coherent Judgement: Unimpaired Orientation: Person;Place;Situation Obsessive Compulsive Thoughts/Behaviors: None  Cognitive Functioning Concentration: Decreased Memory: Recent Intact;Remote Intact Level of Function:  (questionable?? but no documentation to support) Insight: Poor Impulse Control: Poor Appetite: Poor Weight Loss:  ("I loss 110 pounds in 6 months") Weight Gain:  (none reported) Sleep:  No Change Total Hours of Sleep:  (1 hour of sleep ) Vegetative Symptoms: None  ADLScreening [facility] Assessment Services) Patient's cognitive ability adequate to safely complete daily activities?: No Patient able to express need for assistance with ADLs?: No Independently  performs ADLs?: No  Prior Inpatient Therapy Prior Inpatient Therapy: Yes Prior Therapy Dates: Pt cannot recall Prior Therapy Facilty/Provider(s): Pt cannot recall Reason for Treatment: Unknown  Prior Outpatient Therapy Prior Outpatient Therapy: Yes Prior Therapy Dates: Over one year Prior Therapy Facilty/Provider(s): [facility] Reason for Treatment: med management  ADL Screening (condition at time of admission) Patient's cognitive ability adequate to safely complete daily activities?: No Is the patient deaf or have difficulty hearing?: No Does the patient have difficulty seeing, even when wearing glasses/contacts?: No Does the patient have difficulty concentrating, remembering, or making decisions?: No Patient able to express need for assistance with ADLs?: No Does the patient have difficulty dressing or bathing?: No Independently performs ADLs?: No Communication: Independent Dressing (OT): Independent Grooming: Independent Feeding: Independent Bathing: Independent Toileting: Independent In/Out Bed: Independent Walks in Home: Independent Does the patient have difficulty walking or climbing stairs?: No Weakness of Legs: None Weakness of Arms/Hands: None  Home Assistive Devices/Equipment Home Assistive Devices/Equipment: None    Abuse/Neglect Assessment (Assessment to be complete while patient is alone) Physical Abuse: Denies Verbal Abuse: Denies Sexual Abuse: Denies Exploitation of patient/patient's resources: Denies Self-Neglect: Denies Values / Beliefs Cultural Requests During Hospitalization: None Spiritual Requests During Hospitalization: None     Nutrition Screen- [facility] Adult/[facility] Patient's home diet: Regular  Additional Information 1:1 In Past 12 Months?: No CIRT Risk: No Elopement Risk: No Does patient have medical clearance?: Yes     Disposition:  Disposition Initial Assessment Completed for this Encounter: Yes Disposition of Patient: Inpatient  treatment program Type of inpatient treatment program: Adult Other disposition(s):  (Pending a inpatient admission on the adult unit-[address] hall) Patient referred to:  [facility])  On Site Evaluation by:   Reviewed with Physician:    Melynda Ripple [facility] 01/21/2013 4:25 PM

## 2013-01-21 NOTE — ED Notes (Signed)
Pt talking very loudly, [employer] remains at bedside

## 2013-01-21 NOTE — ED Notes (Signed)
During triage, pt looked around the room, stated "i'm fixing to break some necks up in here", security called to triage, assisted w/ escort to exam room, wanded by security.
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
## 2013-01-21 NOTE — Progress Notes (Signed)
[name], MHT completed placement search by contacting the following facilities;   [facility] per [place] fax for review [name] [name] fax for review [facility] at capacity [facility] [name] fax for review [name] per [name] fax for review [facility] no response [facility] at capacity but can fax for review with expected d/c [facility] per [place] can fax for review [facility] at capacity
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
## 2013-01-21 NOTE — ED Notes (Signed)
Called [facility] for Telepsych time will be 1200.

## 2013-01-22 LAB — VALPROIC ACID LEVEL: Valproic Acid Lvl: 71.4 ug/mL (ref 50.0–100.0)

## 2013-01-22 MED ORDER — LITHIUM CARBONATE 300 MG PO CAPS
ORAL_CAPSULE | ORAL | Status: AC
  Filled 2013-01-22: qty 2

## 2013-01-22 NOTE — ED Notes (Signed)
Pt laughing and singing in room.

## 2013-01-22 NOTE — ED Notes (Signed)
Pt given juice as requested. Officer in hallway.

## 2013-01-23 MED ORDER — OLANZAPINE 5 MG PO TABS
ORAL_TABLET | ORAL | Status: AC
  Filled 2013-01-23: qty 2

## 2013-01-23 MED ORDER — OLANZAPINE 5 MG PO TBDP
5.0000 mg | ORAL_TABLET | Freq: Two times a day (BID) | ORAL | Status: DC
  Administered 2013-01-24 – 2013-01-25 (×3): 5 mg via ORAL
  Filled 2013-01-23 (×5): qty 1

## 2013-01-23 MED ORDER — OLANZAPINE 10 MG PO TBDP
10.0000 mg | ORAL_TABLET | Freq: Every day | ORAL | Status: DC
Start: 2013-01-23 — End: 2013-01-23
  Administered 2013-01-23: 10 mg via ORAL
  Filled 2013-01-23 (×2): qty 1

## 2013-01-23 MED ORDER — OLANZAPINE 5 MG PO TBDP: 5.0000 mg | ORAL_TABLET | Freq: Two times a day (BID) | ORAL | Status: DC

## 2013-01-23 NOTE — ED Notes (Signed)
Telepsych machine set up in pt's room

## 2013-01-23 NOTE — Progress Notes (Signed)
[name], MHT completed follow up on previous referrals submitted to the facilities listed below; [facility] per [place] patient is out of catchment area [facility] per [place] referral received but currently at [facility] per still under review referral resubmitted [facility] per [place] did not receive referral resubmitted [facility] per [place] still under review

## 2013-01-23 NOTE — ED Notes (Signed)
Breakfast tray given to pt 
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
## 2013-01-23 NOTE — ED Notes (Signed)
Inetta Fermo, from [facility] called regarding pt being an IVC. Inetta Fermo made aware of previous request to have a repeat assessment. States she will check and give [place] a time

## 2013-01-23 NOTE — ED Notes (Signed)
Pt is stating that he feels a lot better since he has had some sleep and back on his meds, pt states he no longer has any thoughts or intentions of harming anyone. Pt has continued to be polite and cooperative, pt states he would like to go back to the group home soon.

## 2013-01-23 NOTE — Consult Note (Signed)
Patient discussed, history reviewed and treatment plan decided with physician extender
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
## 2013-01-24 MED ORDER — PANTOPRAZOLE SODIUM 40 MG PO TBEC
DELAYED_RELEASE_TABLET | ORAL | Status: AC
  Filled 2013-01-24: qty 1

## 2013-01-24 MED ORDER — ZOLPIDEM TARTRATE 5 MG PO TABS
ORAL_TABLET | ORAL | Status: AC
  Administered 2013-01-24: 5 mg via ORAL
  Filled 2013-01-24: qty 1

## 2013-01-24 NOTE — ED Notes (Signed)
Pt eating tv dinner, sitting on side of the bedside.

## 2013-01-24 NOTE — ED Notes (Signed)
Gave pt decaf coffee, cream and sugar, pt ambulatory to the bathroom with tech as sitter with pt.
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
## 2013-01-24 NOTE — ED Notes (Signed)
Pt denies pt, finished dinner, has not complaints, sitter at the bedside.

## 2013-01-24 NOTE — BH Assessment (Signed)
Received call from [facility]. Pt is declined due to his acuity.  Harlin Rain Ria Comment, [facility] Triage Specialist

## 2013-01-24 NOTE — ED Notes (Signed)
[facility] called and reported could not accept patient due to patient being out of network.

## 2013-01-24 NOTE — BH Assessment (Addendum)
-   Pt is on the waiting list at [facility] call back around midnoon per Alinda Money. - [facility] lefted a message on 10/4 at [phone] [facility] resubmitted referral  on 10/4 at 0854 and at 0911 nurse called back said had no bed for pt. [facility]- resubmitted on 10/4 at 0843 - [facility] referral under review - [facility] @ capacity on 10/4 [facility] @ capacity on 10/4  - [facility] pt. declined due to having Adult Medicaid per [facility].

## 2013-01-24 NOTE — ED Notes (Signed)
Pt denies SI/ HI. Pt states he is here because group home feels he needs his medication adjusted. States his attitude has been bad.

## 2013-01-24 NOTE — ED Notes (Signed)
Meal tray given 

## 2013-01-24 NOTE — ED Notes (Signed)
Pt complaining of headache, used prn tylenol order.

## 2013-01-24 NOTE — ED Notes (Signed)
Pt in room resting with lights off.

## 2013-01-24 NOTE — BH Assessment (Signed)
-   Returned call to [facility] around [place] on 10/4 regarding a bed, per Randa Evens they have no beds can call back to tomorrow.
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
## 2013-01-25 NOTE — Progress Notes (Signed)
The following facilities were called for and update regarding placement:  [facility]- @ 1250, no answer [facility]- @ 1217, spoke with Herbert Seta, stated they had no bed availability [facility]- @ 1218, spoke with Denny Peon, stated they were at capacity [facility]- @1220 , spoke with Ander Slade, was unsure the status of the referral but would have the RN call back [facility]- @1224 , stated they would call back  [facility]- @1255 , no answer  Tomi Bamberger, MHT

## 2013-01-25 NOTE — ED Provider Notes (Signed)
12:51 PM Patient has been seen and evaluated by the psychiatric team.  He's been monitored in the emergency department for 4 days.  His symptoms seem to be significantly improved.  Patient was reevaluated by the psychiatry team today and was deemed to be safe for discharge back to the group home.  This was discussed with the group home by TTS at a group home is definitely willing to take the patient home.  Patient contracts for safety.  Please see psychiatric consultation notes for complete details  Lyanne Co, MD 01/25/13 1251

## 2013-01-25 NOTE — ED Notes (Signed)
Attempted to call pt's group home for DC left message.

## 2013-01-25 NOTE — ED Notes (Signed)
Report called to Oneal Grout at [facility]. Administrator reluctantly agreed to come pick up the pt.  This explained that the patient has been medically cleared and has not shown any aggressive behavior during the time he has been here and the pt has been cleared by Mental Health to go back to the facility.

## 2013-01-25 NOTE — ED Notes (Signed)
Behavioral health called pt needs to reassessment in the morning.

## 2013-01-25 NOTE — ED Notes (Signed)
Pt remains calm and cooperative 

## 2013-01-25 NOTE — ED Notes (Signed)
IVC has been lifted, paperwork in order per [facility]. Awaiting pt's ride to arrive.

## 2013-01-25 NOTE — ED Notes (Signed)
Pt;s ride arrived, transfer of care took place without incident.

## 2013-01-25 NOTE — ED Notes (Signed)
Discharge instructions reviewed with pt, questions answered. Pt verbalized understanding.  

## 2013-01-25 NOTE — BH Assessment (Signed)
Spoke with Pt's RN, Leitha Bleak, to determine if now would be a good time to re-assess Pt. She said Pt is currently asleep and is best not disturbed. Will notify TTS day shift staff to re-assess Pt today.  Harlin Rain Ria Comment, [facility] Triage Specialist

## 2013-02-03 ENCOUNTER — Encounter (HOSPITAL_COMMUNITY): Payer: Self-pay | Admitting: Emergency Medicine

## 2013-02-03 ENCOUNTER — Emergency Department (HOSPITAL_COMMUNITY)
Admission: EM | Admit: 2013-02-03 | Discharge: 2013-02-03 | Disposition: A | Discharge status: Home / Self Care | Payer: Medicaid Other | Attending: Emergency Medicine | Admitting: Emergency Medicine

## 2013-02-03 DIAGNOSIS — F319 Bipolar disorder, unspecified: Secondary | ICD-10-CM | POA: Insufficient documentation

## 2013-02-03 DIAGNOSIS — F259 Schizoaffective disorder, unspecified: Secondary | ICD-10-CM | POA: Insufficient documentation

## 2013-02-03 DIAGNOSIS — F411 Generalized anxiety disorder: Secondary | ICD-10-CM | POA: Insufficient documentation

## 2013-02-03 DIAGNOSIS — F17200 Nicotine dependence, unspecified, uncomplicated: Secondary | ICD-10-CM | POA: Insufficient documentation

## 2013-02-03 DIAGNOSIS — K219 Gastro-esophageal reflux disease without esophagitis: Secondary | ICD-10-CM | POA: Insufficient documentation

## 2013-02-03 DIAGNOSIS — G40909 Epilepsy, unspecified, not intractable, without status epilepticus: Secondary | ICD-10-CM | POA: Insufficient documentation

## 2013-02-03 DIAGNOSIS — F250 Schizoaffective disorder, bipolar type: Secondary | ICD-10-CM

## 2013-02-03 DIAGNOSIS — Z79899 Other long term (current) drug therapy: Secondary | ICD-10-CM | POA: Insufficient documentation

## 2013-02-03 NOTE — ED Notes (Signed)
Pt to be returned to [facility].  [facility] notified and the pt.
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
## 2013-02-03 NOTE — ED Notes (Signed)
Caregiver arrived for transport to [facility]. Report given to gloria.  Pt calm at present, very sexual, asking to kiss staff members and asking if we can "be his babies momma".  Pt walked out calmly.  Belongings given to caregiver.  Pt changed into his clothing. D/c papers given to caregiver.

## 2013-02-03 NOTE — ED Notes (Signed)
[facility] notified of pt's discharge and return to facility.  Report given to facility
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
## 2013-02-03 NOTE — ED Notes (Signed)
Care delayed due to pt behavior (yelling and uncooperative).  Police in the room with pt.

## 2013-02-03 NOTE — ED Notes (Signed)
Pt resting in bed, resp even, [facility] at bedside.

## 2013-02-03 NOTE — ED Notes (Signed)
8.00 of pts' money returned to pt by security.

## 2013-02-05 ENCOUNTER — Encounter (HOSPITAL_COMMUNITY): Payer: Self-pay | Admitting: Emergency Medicine

## 2013-02-05 ENCOUNTER — Emergency Department (HOSPITAL_COMMUNITY)
Admission: EM | Admit: 2013-02-05 | Discharge: 2013-02-06 | Disposition: A | Discharge status: Home / Self Care | Payer: Medicaid Other | Attending: Emergency Medicine | Admitting: Emergency Medicine

## 2013-02-05 DIAGNOSIS — R456 Violent behavior: Secondary | ICD-10-CM

## 2013-02-05 DIAGNOSIS — F411 Generalized anxiety disorder: Secondary | ICD-10-CM | POA: Insufficient documentation

## 2013-02-05 DIAGNOSIS — F911 Conduct disorder, childhood-onset type: Secondary | ICD-10-CM | POA: Insufficient documentation

## 2013-02-05 DIAGNOSIS — K219 Gastro-esophageal reflux disease without esophagitis: Secondary | ICD-10-CM | POA: Insufficient documentation

## 2013-02-05 DIAGNOSIS — F919 Conduct disorder, unspecified: Secondary | ICD-10-CM | POA: Insufficient documentation

## 2013-02-05 DIAGNOSIS — F319 Bipolar disorder, unspecified: Secondary | ICD-10-CM | POA: Insufficient documentation

## 2013-02-05 DIAGNOSIS — F17200 Nicotine dependence, unspecified, uncomplicated: Secondary | ICD-10-CM | POA: Insufficient documentation

## 2013-02-05 DIAGNOSIS — Z79899 Other long term (current) drug therapy: Secondary | ICD-10-CM | POA: Insufficient documentation

## 2013-02-05 DIAGNOSIS — F209 Schizophrenia, unspecified: Secondary | ICD-10-CM | POA: Insufficient documentation

## 2013-02-05 DIAGNOSIS — G40909 Epilepsy, unspecified, not intractable, without status epilepticus: Secondary | ICD-10-CM | POA: Insufficient documentation

## 2013-02-05 LAB — CBC WITH DIFFERENTIAL/PLATELET
Basophils Absolute: 0 10*3/uL (ref 0.0–0.1)
Eosinophils Absolute: 0.2 10*3/uL (ref 0.0–0.7)
Eosinophils Relative: 3 % (ref 0–5)
HCT: 43.1 % (ref 39.0–52.0)
Hemoglobin: 13.9 g/dL (ref 13.0–17.0)
Lymphs Abs: 2.5 10*3/uL (ref 0.7–4.0)
MCH: 30.3 pg (ref 26.0–34.0)
MCV: 94.1 fL (ref 78.0–100.0)
Monocytes Absolute: 0.7 10*3/uL (ref 0.1–1.0)
Monocytes Relative: 10 % (ref 3–12)
Neutro Abs: 3.7 10*3/uL (ref 1.7–7.7)
Platelets: 209 10*3/uL (ref 150–400)
RBC: 4.58 MIL/uL (ref 4.22–5.81)
RDW: 12.6 % (ref 11.5–15.5)
WBC: 7.2 10*3/uL (ref 4.0–10.5)

## 2013-02-05 LAB — BASIC METABOLIC PANEL
BUN: 10 mg/dL (ref 6–23)
CO2: 31 mEq/L (ref 19–32)
Calcium: 9.9 mg/dL (ref 8.4–10.5)
Chloride: 105 mEq/L (ref 96–112)
Creatinine, Ser: 1 mg/dL (ref 0.50–1.35)
GFR calc non Af Amer: >90 mL/min (ref >90)
Glucose, Bld: 99 mg/dL (ref 70–99)

## 2013-02-05 LAB — URINALYSIS, ROUTINE W REFLEX MICROSCOPIC
Bilirubin Urine: NEGATIVE
Glucose, UA: NEGATIVE mg/dL
Hgb urine dipstick: NEGATIVE
Nitrite: NEGATIVE
Protein, ur: 30 mg/dL — AB
Specific Gravity, Urine: >1.03 — ABNORMAL HIGH (ref 1.005–1.030)
Urobilinogen, UA: 0.2 mg/dL (ref 0.0–1.0)
pH: 6 (ref 5.0–8.0)

## 2013-02-05 LAB — URINE MICROSCOPIC-ADD ON

## 2013-02-05 LAB — RAPID URINE DRUG SCREEN, HOSP PERFORMED
Amphetamines: NOT DETECTED
Barbiturates: NOT DETECTED
Cocaine: NOT DETECTED
Tetrahydrocannabinol: NOT DETECTED

## 2013-02-05 LAB — VALPROIC ACID LEVEL: Valproic Acid Lvl: 109.2 ug/mL — ABNORMAL HIGH (ref 50.0–100.0)

## 2013-02-05 MED ORDER — POTASSIUM CHLORIDE CRYS ER 20 MEQ PO TBCR
40.0000 meq | EXTENDED_RELEASE_TABLET | Freq: Once | ORAL | Status: AC
  Administered 2013-02-05: 40 meq via ORAL
  Filled 2013-02-05: qty 2

## 2013-02-05 MED ORDER — LORAZEPAM 1 MG PO TABS
1.0000 mg | ORAL_TABLET | Freq: Once | ORAL | Status: AC
  Administered 2013-02-05: 1 mg via ORAL
  Filled 2013-02-05: qty 1
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
## 2013-02-05 NOTE — ED Notes (Signed)
Patient advised we are awaiting Telepsych Consult- he is presently cooperative.  Talking at intervals with the deputy.  Patient watching TV

## 2013-02-05 NOTE — ED Notes (Signed)
Called Behavioral Health spoke with [facility], patient is third on the list will call back when they are ready.
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
## 2013-02-05 NOTE — ED Notes (Addendum)
Meal given - patient states he missed the evening meal at his group home.  Ate 50 % of food on tray.  Also requested crackers and juice.  Given

## 2013-02-06 ENCOUNTER — Emergency Department (HOSPITAL_COMMUNITY)
Admission: EM | Admit: 2013-02-06 | Discharge: 2013-02-07 | Disposition: A | Discharge status: Home / Self Care | Payer: Medicaid Other | Attending: Emergency Medicine | Admitting: Emergency Medicine

## 2013-02-06 ENCOUNTER — Encounter (HOSPITAL_COMMUNITY): Payer: Self-pay | Admitting: Emergency Medicine

## 2013-02-06 DIAGNOSIS — F411 Generalized anxiety disorder: Secondary | ICD-10-CM | POA: Insufficient documentation

## 2013-02-06 DIAGNOSIS — F2089 Other schizophrenia: Secondary | ICD-10-CM | POA: Insufficient documentation

## 2013-02-06 DIAGNOSIS — F17200 Nicotine dependence, unspecified, uncomplicated: Secondary | ICD-10-CM | POA: Insufficient documentation

## 2013-02-06 DIAGNOSIS — R451 Restlessness and agitation: Secondary | ICD-10-CM

## 2013-02-06 DIAGNOSIS — F250 Schizoaffective disorder, bipolar type: Secondary | ICD-10-CM

## 2013-02-06 DIAGNOSIS — K219 Gastro-esophageal reflux disease without esophagitis: Secondary | ICD-10-CM | POA: Insufficient documentation

## 2013-02-06 DIAGNOSIS — G40909 Epilepsy, unspecified, not intractable, without status epilepticus: Secondary | ICD-10-CM | POA: Insufficient documentation

## 2013-02-06 DIAGNOSIS — Z79899 Other long term (current) drug therapy: Secondary | ICD-10-CM | POA: Insufficient documentation

## 2013-02-06 DIAGNOSIS — F319 Bipolar disorder, unspecified: Secondary | ICD-10-CM | POA: Insufficient documentation

## 2013-02-06 DIAGNOSIS — IMO0002 Reserved for concepts with insufficient information to code with codable children: Secondary | ICD-10-CM | POA: Insufficient documentation

## 2013-02-06 DIAGNOSIS — Z791 Long term (current) use of non-steroidal anti-inflammatories (NSAID): Secondary | ICD-10-CM | POA: Insufficient documentation

## 2013-02-06 MED ORDER — BENZTROPINE MESYLATE 1 MG PO TABS
2.0000 mg | ORAL_TABLET | Freq: Two times a day (BID) | ORAL | Status: DC
  Administered 2013-02-07: 2 mg via ORAL
  Filled 2013-02-06 (×2): qty 2

## 2013-02-06 MED ORDER — LORAZEPAM 1 MG PO TABS: 1.0000 mg | ORAL_TABLET | Freq: Three times a day (TID) | ORAL | Status: DC | PRN

## 2013-02-06 MED ORDER — LITHIUM CARBONATE 300 MG PO CAPS
600.0000 mg | ORAL_CAPSULE | Freq: Two times a day (BID) | ORAL | Status: DC
  Administered 2013-02-07: 600 mg via ORAL
  Filled 2013-02-06 (×8): qty 2

## 2013-02-06 MED ORDER — DIVALPROEX SODIUM 250 MG PO DR TAB
1000.0000 mg | DELAYED_RELEASE_TABLET | Freq: Two times a day (BID) | ORAL | Status: DC
  Administered 2013-02-07: 1000 mg via ORAL
  Filled 2013-02-06: qty 4

## 2013-02-06 MED ORDER — ZIPRASIDONE MESYLATE 20 MG IM SOLR
20.0000 mg | Freq: Once | INTRAMUSCULAR | Status: AC
  Administered 2013-02-06: 20 mg via INTRAMUSCULAR
  Filled 2013-02-06: qty 20

## 2013-02-06 MED ORDER — IBUPROFEN 800 MG PO TABS
800.0000 mg | ORAL_TABLET | Freq: Three times a day (TID) | ORAL | Status: DC
  Administered 2013-02-07: 800 mg via ORAL
  Filled 2013-02-06 (×2): qty 1

## 2013-02-06 MED ORDER — CLONAZEPAM 0.5 MG PO TABS: 1.0000 mg | ORAL_TABLET | Freq: Two times a day (BID) | ORAL | Status: DC | PRN

## 2013-02-06 MED ORDER — LEVOTHYROXINE SODIUM 25 MCG PO TABS
25.0000 ug | ORAL_TABLET | Freq: Every day | ORAL | Status: DC
  Filled 2013-02-06 (×4): qty 1

## 2013-02-06 MED ORDER — PANTOPRAZOLE SODIUM 40 MG PO TBEC: 40.0000 mg | DELAYED_RELEASE_TABLET | Freq: Every day | ORAL | Status: DC

## 2013-02-06 MED ORDER — ZIPRASIDONE MESYLATE 20 MG IM SOLR: 10.0000 mg | Freq: Four times a day (QID) | INTRAMUSCULAR | Status: DC | PRN

## 2013-02-06 NOTE — ED Notes (Signed)
Pt given lunch tray.

## 2013-02-06 NOTE — ED Notes (Signed)
Pt out in hallway, yelling cursing at staff, being inappropriate. Has not been violent or threatened, but upset that he has not been discharged. Attempting to get in touch with group home with no response. Security and [employer] at bedside to ensure safety.

## 2013-02-06 NOTE — Consult Note (Signed)
  Attempted Telepsych per request of Dr Jeraldine Loots.Pt sedated/sleepy Unable to carry on interview.Request ED contact us Back when pt awake and able to communicate

## 2013-02-06 NOTE — ED Notes (Signed)
Attempted to call group home again. Still no answer.

## 2013-02-06 NOTE — ED Notes (Signed)
2 patient belonging bags and 1 large trash bag of clothes placed in locker room labeled.

## 2013-02-06 NOTE — Progress Notes (Signed)
ED/CM noted patient did not have health insurance and/or PCP listed in the computer.  Patient was given the [place] resource handout with information on the clinics, food pantries, and the handout for new health insurance sign-up.  Patient expressed appreciation for this. 

## 2013-02-06 NOTE — ED Provider Notes (Signed)
Care assumed from Dr. Deretha Emory at shift change. Patient awaiting tele-psych consultation. This was performed and the patient is not felt to be suicidal or homicidal and not appropriate for involuntary commitment. These forms were rescinded and the patient is cooperative and does not appear to be a danger to himself or others. At this point he will be returned to the group home from which he was sent here.  Riley Lyons, MD 02/06/13 [phone]

## 2013-02-06 NOTE — ED Notes (Signed)
[name]  callled and do a consult with Pt right after the one she is doing around 8 pm.  Pts nurse informed.
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
## 2013-02-06 NOTE — ED Notes (Signed)
Tele-psych consult completed.   

## 2013-02-06 NOTE — ED Notes (Signed)
Pt states he has problems with anger. Had argument with girlfriend today and that precipitated his anger. Pt states he hears voices in his head and sees demons. Denies SI/HI. Calm and cooperative at this time. States he sees an outpatient psychiatrist and takes his meds.

## 2013-02-06 NOTE — ED Notes (Signed)
Called group home owner cell number [phone]) with no answer. Voicemail left with call back number to ED.

## 2013-02-06 NOTE — ED Notes (Signed)
Patient hollering and threw food tray and drink out of room. Called security and PD to bedside. Patient lying in bed complaining that "they told me I could go home and now they say I can't."

## 2013-02-06 NOTE — BH Assessment (Signed)
[facility] Assessment Progress Note    Received clarification from [facility]; Dr. Jeraldine Loots would like the patient to have a telepsych evaluation. Tim, NT to contact Maryjean Morn, PA to arrange a time for patient to receive telepsych evaluation.  Shon Baton, MSW, LCSW, LCASA, CSW-G

## 2013-02-06 NOTE — ED Notes (Signed)
Per EMS, pt reports he wants evaluation for his anger problems. Pt was seen here yesterday for same and sent home. Per EMS, cbg en route 124. nad noted at this time.

## 2013-02-06 NOTE — ED Notes (Signed)
Psychiatrist stated they were ready for patient telepsych. Patient lying in bed with eyes closed. Refuses to talk to psychiatrist. Patient ambulated to bathroom immediately after telepsych turned off. Attempted to get patient to talk to psychiatrist. Patient continues to lie in bed with eyes closed and states "I'm tired. I will just talk to them in the morning." Patient still refuses to agree to talk to psychiatrist after repeated attempts.

## 2013-02-06 NOTE — ED Notes (Signed)
Sitter at bedside.

## 2013-02-06 NOTE — ED Notes (Signed)
No findings to place patient as involuntary commitment.  Released to [employer] to return patient to group home residence.
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
## 2013-02-06 NOTE — ED Notes (Signed)
Attempted to call Group Home [phone]), no answer.

## 2013-02-07 ENCOUNTER — Encounter (HOSPITAL_COMMUNITY): Payer: Self-pay | Admitting: Psychiatry

## 2013-02-07 DIAGNOSIS — F259 Schizoaffective disorder, unspecified: Secondary | ICD-10-CM

## 2013-02-07 LAB — URINE CULTURE: Culture: NO GROWTH

## 2013-02-07 MED ORDER — LITHIUM CARBONATE 300 MG PO CAPS
ORAL_CAPSULE | ORAL | Status: AC
  Filled 2013-02-07: qty 2

## 2013-02-07 NOTE — ED Provider Notes (Signed)
Verne Spurr NP from [facility] called back and states she felt this patient should be admitted to [facility] if he returns to the ED.   Ward Givens, MD 02/07/13 1221

## 2013-02-07 NOTE — BH Assessment (Addendum)
[facility] Assessment Progress Note Received call from [facility] asking for tele psych for pt @ 0800. Informed would call back with an appt time once provider available. Kathaleen Maser, Diplomatic Services operational officer at [facility] @ [phone], and appt scheduled for 0930 for tele psych assessment. She will inform ED staff there. TTS staff updated.

## 2013-02-07 NOTE — ED Notes (Signed)
Home Away from [facility] given report and informed pt is returning to their facility via [facility].

## 2013-02-07 NOTE — ED Notes (Signed)
Tele-psych computer placed in pt's room for his 9:15 appointment.

## 2013-02-07 NOTE — ED Notes (Signed)
Pt's tele-psych has been completed. Pt cleared to return to group home. Sheriff dept called for transportation. Pt informed of discharge.

## 2013-02-07 NOTE — ED Provider Notes (Signed)
Pt states he feels "alot better" and states "I want to go back" to his group home. Pt waiting for telepsych consult. Staff relate he has been no problem throughout the night.   Devoria Albe, MD, FACEP   Ward Givens, MD 02/07/13 [phone]

## 2013-02-07 NOTE — ED Notes (Signed)
Sheriff's deputy is here to take pt back to group home.

## 2013-02-07 NOTE — ED Notes (Signed)
appt time at 9:30 for [facility]. [name]

## 2013-02-07 NOTE — ED Provider Notes (Signed)
Telepsych consult has been done and they recommend discharge back to his group home. Pt IVC rescinded and patient discharged.   Ward Givens, MD 02/07/13 1009

## 2013-02-07 NOTE — ED Notes (Signed)
Pt awake and states he would like to do his tele-psych evaluation. Pt told staff would have to call and set up a time for eval. Pt given breakfast tray.

## 2013-02-07 NOTE — Consult Note (Signed)
Telepsych Consultation   Reason for Consult:  General evaluation Referring Physician:  ER MD [name] is an 27 y.o. male.  Assessment: AXIS I:  Schizoaffective Disorder AXIS II:  Deferred AXIS III:   Past Medical History  Diagnosis Date  . Bipolar 1 disorder   . Schizophrenia, acute   . Seizures   . Acid reflux   . Anxiety    AXIS IV:  other psychosocial or environmental problems, problems related to social environment and problems with primary support group AXIS V:  61-70 mild symptoms  Plan:  No evidence of imminent risk to self or others at present.    Subjective:   [name] is a 27 y.o. male patient does not warrant admission.  HPI:  Patient had a verbal altercation with the people in his group home.  He denied suicidal/homicidal ideations and auditory/visual hallucinations. [name] does not have a problem returning to his group home, feels safe.   HPI Elements:   NA   Past Psychiatric History: Past Medical History  Diagnosis Date  . Bipolar 1 disorder   . Schizophrenia, acute   . Seizures   . Acid reflux   . Anxiety     reports that he has been smoking.  He does not have any smokeless tobacco history on file. He reports that he drinks alcohol. He reports that he does not use illicit drugs. History reviewed. No pertinent family history.       Allergies:   Allergies  Allergen Reactions  . Benadryl [Diphenhydramine Hcl] Other (See Comments)    ACT Assessment Complete:  No:   Past Psychiatric History: Diagnosis:  Schizoaffective  Hospitalizations:  Yes  Outpatient Care:  Group home takes him  Substance Abuse Care:  NA  Self-Mutilation:  NA   Objective: Blood pressure 126/69, pulse 66, temperature 98.9 F (37.2 C), temperature source Oral, resp. rate 18, height 6\' 5"  (1.956 m), weight 97.523 kg (215 lb), SpO2 100.00%.Body mass index is 25.49 kg/(m^2). Results for orders placed during the hospital encounter of 02/05/13 (from the past 72 hour(s))   CBC WITH DIFFERENTIAL     Status: None   Collection Time    02/05/13  8:44 PM      Result Value Range   WBC 7.2  4.0 - 10.5 K/uL   RBC 4.58  4.22 - 5.81 MIL/uL   Hemoglobin 13.9  13.0 - 17.0 g/dL   HCT 96.0  45.4 - 09.8 %   MCV 94.1  78.0 - 100.0 fL   MCH 30.3  26.0 - 34.0 pg   MCHC 32.3  30.0 - 36.0 g/dL   RDW 11.9  14.7 - 82.9 %   Platelets 209  150 - 400 K/uL   Neutrophils Relative % 51  43 - 77 %   Neutro Abs 3.7  1.7 - 7.7 K/uL   Lymphocytes Relative 36  12 - 46 %   Lymphs Abs 2.5  0.7 - 4.0 K/uL   Monocytes Relative 10  3 - 12 %   Monocytes Absolute 0.7  0.1 - 1.0 K/uL   Eosinophils Relative 3  0 - 5 %   Eosinophils Absolute 0.2  0.0 - 0.7 K/uL   Basophils Relative 0  0 - 1 %   Basophils Absolute 0.0  0.0 - 0.1 K/uL  BASIC METABOLIC PANEL     Status: Abnormal   Collection Time    02/05/13  8:44 PM      Result Value Range   Sodium 143  135 - 145 mEq/L   Potassium 3.1 (*) 3.5 - 5.1 mEq/L   Chloride 105  96 - 112 mEq/L   CO2 31  19 - 32 mEq/L   Glucose, Bld 99  70 - 99 mg/dL   BUN 10  6 - 23 mg/dL   Creatinine, Ser 3.08  0.50 - 1.35 mg/dL   Calcium 9.9  8.4 - 65.7 mg/dL   GFR calc non Af Amer >90  >90 mL/min   GFR calc Af Amer >90  >90 mL/min   Comment: (NOTE)     The eGFR has been calculated using the CKD EPI equation.     This calculation has not been validated in all clinical situations.     eGFR's persistently <90 mL/min signify possible Chronic Kidney     Disease.  [name]     Status: None   Collection Time    02/05/13  8:44 PM      Result Value Range   Alcohol, Ethyl (B) <11  0 - 11 mg/dL   Comment:            LOWEST DETECTABLE LIMIT FOR     SERUM ALCOHOL IS 11 mg/dL     FOR MEDICAL PURPOSES ONLY  VALPROIC ACID LEVEL     Status: Abnormal   Collection Time    02/05/13  8:44 PM      Result Value Range   Valproic Acid Lvl 109.2 (*) 50.0 - 100.0 ug/mL  URINALYSIS, ROUTINE W REFLEX MICROSCOPIC     Status: Abnormal   Collection Time    02/05/13  8:51 PM       Result Value Range   Color, Urine YELLOW  YELLOW   APPearance CLEAR  CLEAR   Specific Gravity, Urine >1.030 (*) 1.005 - 1.030   pH 6.0  5.0 - 8.0   Glucose, UA NEGATIVE  NEGATIVE mg/dL   Hgb urine dipstick NEGATIVE  NEGATIVE   Bilirubin Urine NEGATIVE  NEGATIVE   Ketones, ur TRACE (*) NEGATIVE mg/dL   Protein, ur 30 (*) NEGATIVE mg/dL   Urobilinogen, UA 0.2  0.0 - 1.0 mg/dL   Nitrite NEGATIVE  NEGATIVE   Leukocytes, UA NEGATIVE  NEGATIVE  URINE RAPID DRUG SCREEN (HOSP PERFORMED)     Status: None   Collection Time    02/05/13  8:51 PM      Result Value Range   Opiates NONE DETECTED  NONE DETECTED   Cocaine NONE DETECTED  NONE DETECTED   Benzodiazepines NONE DETECTED  NONE DETECTED   Amphetamines NONE DETECTED  NONE DETECTED   Tetrahydrocannabinol NONE DETECTED  NONE DETECTED   Barbiturates NONE DETECTED  NONE DETECTED   Comment:            DRUG SCREEN FOR MEDICAL PURPOSES     ONLY.  IF CONFIRMATION IS NEEDED     FOR ANY PURPOSE, NOTIFY LAB     WITHIN 5 DAYS.                LOWEST DETECTABLE LIMITS     FOR URINE DRUG SCREEN     Drug Class       Cutoff (ng/mL)     Amphetamine      1000     Barbiturate      200     Benzodiazepine   200     Tricyclics       300     Opiates          300  Cocaine          300     THC              50  URINE MICROSCOPIC-ADD ON     Status: Abnormal   Collection Time    02/05/13  8:51 PM      Result Value Range   Squamous Epithelial / LPF RARE  RARE   WBC, UA 11-20  <3 WBC/hpf   Bacteria, UA FEW (*) RARE   Casts WBC CAST (*) NEGATIVE   Labs are reviewed and are pertinent for no medical issues..  Current Facility-Administered Medications  Medication Dose Route Frequency Provider Last Rate Last Dose  . benztropine (COGENTIN) tablet 2 mg  2 mg Oral BID Gerhard Munch, MD   2 mg at 02/07/13 0036  . clonazePAM (KLONOPIN) tablet 1 mg  1 mg Oral BID PRN Gerhard Munch, MD      . divalproex (DEPAKOTE) DR tablet 1,000 mg  1,000 mg Oral  BID Gerhard Munch, MD   1,000 mg at 02/07/13 0036  . ibuprofen (ADVIL,MOTRIN) tablet 800 mg  800 mg Oral TID Gerhard Munch, MD   800 mg at 02/07/13 0037  . levothyroxine (SYNTHROID, LEVOTHROID) tablet 25 mcg  25 mcg Oral QAC breakfast Gerhard Munch, MD      . lithium carbonate capsule 600 mg  600 mg Oral BID Gerhard Munch, MD   600 mg at 02/07/13 0037  . LORazepam (ATIVAN) tablet 1 mg  1 mg Oral TID PRN Gerhard Munch, MD      . pantoprazole (PROTONIX) EC tablet 40 mg  40 mg Oral Daily Gerhard Munch, MD      . ziprasidone (GEODON) injection 10 mg  10 mg Intramuscular Q6H PRN Gerhard Munch, MD       Current Outpatient Prescriptions  Medication Sig Dispense Refill  . benztropine (COGENTIN) 2 MG tablet Take 2 mg by mouth 2 (two) times daily.      . clonazePAM (KLONOPIN) 1 MG tablet Take 1 mg by mouth 2 (two) times daily as needed for anxiety.      . divalproex (DEPAKOTE) 500 MG DR tablet Take 1,000 mg by mouth 2 (two) times daily.      [name] ibuprofen (ADVIL,MOTRIN) 800 MG tablet Take 1 tablet (800 mg total) by mouth 3 (three) times daily.  21 tablet  0  . levothyroxine (SYNTHROID, LEVOTHROID) 25 MCG tablet Take 25 mcg by mouth daily before breakfast.      . lithium 600 MG capsule Take 600 mg by mouth 2 (two) times daily.       [name] LORazepam (ATIVAN) 1 MG tablet Take 1 mg by mouth 3 (three) times daily as needed for anxiety.      [name] omeprazole (PRILOSEC) 20 MG capsule Take 20 mg by mouth daily.        Psychiatric Specialty Exam:     Blood pressure 126/69, pulse 66, temperature 98.9 F (37.2 C), temperature source Oral, resp. rate 18, height 6\' 5"  (1.956 m), weight 97.523 kg (215 lb), SpO2 100.00%.Body mass index is 25.49 kg/(m^2).  General Appearance: Casual  Eye Contact::  Fair  Speech:  Normal Rate  Volume:  Normal  Mood:  Euthymic  Affect:  Congruent  Thought Process:  Coherent  Orientation:  Full (Time, Place, and Person)  Thought Content:  WDL  Suicidal Thoughts:  No   Homicidal Thoughts:  No  Memory:  Immediate;   Good Recent;   Good Remote;   Good  Judgement:  Fair  Insight:  Fair  Psychomotor Activity:  Normal  Concentration:  Fair  Recall:  Good  Akathisia:  No  Handed:  Right  AIMS (if indicated):     Assets:  Physical Health Resilience  Sleep:      Treatment Plan Summary: Medication Management--continue his current regiment  Disposition:  Patient is stable to return to his group home.  Nanine Means, PMh-NP 02/07/2013 9:47 AM Agree with assessment and plan Madie Reno A. Dub Mikes, M.D.

## 2013-02-08 ENCOUNTER — Emergency Department (HOSPITAL_COMMUNITY)
Admission: EM | Admit: 2013-02-08 | Discharge: 2013-02-09 | Disposition: A | Discharge status: Home / Self Care | Payer: Medicaid Other | Attending: Emergency Medicine | Admitting: Emergency Medicine

## 2013-02-08 ENCOUNTER — Encounter (HOSPITAL_COMMUNITY): Payer: Self-pay | Admitting: Emergency Medicine

## 2013-02-08 DIAGNOSIS — Z791 Long term (current) use of non-steroidal anti-inflammatories (NSAID): Secondary | ICD-10-CM | POA: Insufficient documentation

## 2013-02-08 DIAGNOSIS — F411 Generalized anxiety disorder: Secondary | ICD-10-CM | POA: Insufficient documentation

## 2013-02-08 DIAGNOSIS — K219 Gastro-esophageal reflux disease without esophagitis: Secondary | ICD-10-CM | POA: Insufficient documentation

## 2013-02-08 DIAGNOSIS — F17200 Nicotine dependence, unspecified, uncomplicated: Secondary | ICD-10-CM | POA: Insufficient documentation

## 2013-02-08 DIAGNOSIS — F319 Bipolar disorder, unspecified: Secondary | ICD-10-CM | POA: Diagnosis not present

## 2013-02-08 DIAGNOSIS — G40909 Epilepsy, unspecified, not intractable, without status epilepticus: Secondary | ICD-10-CM | POA: Diagnosis not present

## 2013-02-08 DIAGNOSIS — R4689 Other symptoms and signs involving appearance and behavior: Secondary | ICD-10-CM

## 2013-02-08 DIAGNOSIS — Z79899 Other long term (current) drug therapy: Secondary | ICD-10-CM | POA: Diagnosis not present

## 2013-02-08 DIAGNOSIS — F911 Conduct disorder, childhood-onset type: Secondary | ICD-10-CM | POA: Diagnosis not present

## 2013-02-08 DIAGNOSIS — F250 Schizoaffective disorder, bipolar type: Secondary | ICD-10-CM

## 2013-02-08 DIAGNOSIS — F259 Schizoaffective disorder, unspecified: Secondary | ICD-10-CM | POA: Diagnosis not present

## 2013-02-08 DIAGNOSIS — Z008 Encounter for other general examination: Secondary | ICD-10-CM | POA: Diagnosis present

## 2013-02-08 LAB — CBC WITH DIFFERENTIAL/PLATELET
Basophils Absolute: 0 10*3/uL (ref 0.0–0.1)
Basophils Relative: 0 % (ref 0–1)
Eosinophils Absolute: 0.1 10*3/uL (ref 0.0–0.7)
Eosinophils Relative: 2 % (ref 0–5)
HCT: 39.4 % (ref 39.0–52.0)
Hemoglobin: 12.6 g/dL — ABNORMAL LOW (ref 13.0–17.0)
Lymphocytes Relative: 34 % (ref 12–46)
MCH: 29.9 pg (ref 26.0–34.0)
MCHC: 32 g/dL (ref 30.0–36.0)
MCV: 93.4 fL (ref 78.0–100.0)
Monocytes Absolute: 0.6 10*3/uL (ref 0.1–1.0)
Platelets: 201 10*3/uL (ref 150–400)
RDW: 12.9 % (ref 11.5–15.5)
WBC: 5.4 10*3/uL (ref 4.0–10.5)

## 2013-02-08 LAB — COMPREHENSIVE METABOLIC PANEL
AST: 20 U/L (ref 0–37)
Albumin: 3.7 g/dL (ref 3.5–5.2)
Alkaline Phosphatase: 45 U/L (ref 39–117)
CO2: 28 mEq/L (ref 19–32)
Calcium: 9.9 mg/dL (ref 8.4–10.5)
Chloride: 104 mEq/L (ref 96–112)
Creatinine, Ser: 0.82 mg/dL (ref 0.50–1.35)
GFR calc non Af Amer: >90 mL/min (ref >90)
Sodium: 139 mEq/L (ref 135–145)
Total Protein: 6.8 g/dL (ref 6.0–8.3)

## 2013-02-08 LAB — RAPID URINE DRUG SCREEN, HOSP PERFORMED
Amphetamines: NOT DETECTED
Barbiturates: NOT DETECTED
Benzodiazepines: NOT DETECTED
Cocaine: NOT DETECTED
Tetrahydrocannabinol: NOT DETECTED

## 2013-02-08 LAB — LITHIUM LEVEL: Lithium Lvl: 0.97 mEq/L (ref 0.80–1.40)

## 2013-02-08 MED ORDER — HALOPERIDOL 5 MG PO TABS
5.0000 mg | ORAL_TABLET | Freq: Two times a day (BID) | ORAL | Status: DC
  Administered 2013-02-08 – 2013-02-09 (×3): 5 mg via ORAL
  Filled 2013-02-08 (×3): qty 1

## 2013-02-08 MED ORDER — LORAZEPAM 1 MG PO TABS
1.0000 mg | ORAL_TABLET | Freq: Three times a day (TID) | ORAL | Status: DC | PRN
  Administered 2013-02-08 – 2013-02-09 (×2): 1 mg via ORAL
  Filled 2013-02-08 (×2): qty 1

## 2013-02-08 MED ORDER — DIVALPROEX SODIUM 500 MG PO DR TAB
1000.0000 mg | DELAYED_RELEASE_TABLET | Freq: Two times a day (BID) | ORAL | Status: DC
  Administered 2013-02-08 – 2013-02-09 (×3): 1000 mg via ORAL
  Filled 2013-02-08 (×3): qty 2

## 2013-02-08 MED ORDER — LITHIUM CARBONATE 300 MG PO CAPS
300.0000 mg | ORAL_CAPSULE | Freq: Every day | ORAL | Status: DC
  Administered 2013-02-08 – 2013-02-09 (×2): 300 mg via ORAL
  Filled 2013-02-08 (×2): qty 1

## 2013-02-08 MED ORDER — LEVOTHYROXINE SODIUM 25 MCG PO TABS
25.0000 ug | ORAL_TABLET | Freq: Every day | ORAL | Status: DC
  Administered 2013-02-09: 25 ug via ORAL
  Filled 2013-02-08 (×2): qty 1

## 2013-02-08 MED ORDER — BENZTROPINE MESYLATE 1 MG PO TABS
2.0000 mg | ORAL_TABLET | Freq: Two times a day (BID) | ORAL | Status: DC
  Administered 2013-02-08 – 2013-02-09 (×3): 2 mg via ORAL
  Filled 2013-02-08 (×3): qty 2

## 2013-02-08 MED ORDER — ZIPRASIDONE MESYLATE 20 MG IM SOLR
INTRAMUSCULAR | Status: AC
  Administered 2013-02-08: 20 mg
  Filled 2013-02-08: qty 20

## 2013-02-08 NOTE — BH Assessment (Signed)
Assessment Note  [name] is an 27 y.o. male.   Pt brought to ED 10-17 and sent back to group home.  [facility] at [facility] conducted Tele psych and determined pt able to return to facility.  Pt returned to ED 10-18.  Pt a new placement at [facility] and does not meet criteria for that programming.  Pt is reportedly threatening staff, clients at [facility].  One client woke up with pt standing over him hitting his fists together.  One parent took her child out of program due to pt hostile and combative remarks to her son.  Staff does not feel safe.    Upon arrival to ED, pt met with Dr. Denton Lank and was initially appropriate.  However, pt became hostile towards [employer] and was on the verge of being tazed twice by police due to aggression and [employer] concern for other pts nearby and their safety.    Dr. Lolly Mustache evaluated pt and pt appeared appropriate for psychiatry but then began discussing hostility towards others and wanting to hurt others before he got hurt.  Pt placed under IVC by Dr. Lolly Mustache.  Pt was using profanity, threatening and becoming aggressive in ED.  Restraints were initiated due to combativeness of pt.   Pt has been seen talking to self and appears to be engaged in bizarre, delusional behaviors.  Pt denies hearing voices with commands.     Pt reports wanting help and there are times when he is appropriate and verbalizes willingness to cooperate.  Pt cognitive ability appears to be questionable.  No indicators of ID related diagnosis.  Pt involved at [facility] for med mgt.  Dr. Lolly Mustache to recommend medications.  Pt most likely will need another group home placement and SW will need to be involved in securing placement.  It should be noted that pt has had numerous group home placements and there may be an issue with securing an appropriate placement.  Current Plan  Pt to remain in ED and medication to be monitored.  Pt may be in need of psych inpt placement or may need another group home that is more appropriate  for pt needs.  Return to current provider is unwise due to the threats and aggression pt is currently demonstrating to other pt.  This particular group home is not designed to effectively deal with these kind of patients.   Again, pt was placed at group home 3 days ago and since placement pt has been aggression and threatening to staff and clients.       Axis I: Schizoaffective Disorder Axis II: Deferred Axis III:  Past Medical History  Diagnosis Date  . Bipolar 1 disorder   . Schizophrenia, acute   . Seizures   . Acid reflux   . Anxiety    Axis IV: housing problems, other psychosocial or environmental problems, problems related to social environment and problems with primary support group Axis V: 31-40 impairment in reality testing  Past Medical History:  Past Medical History  Diagnosis Date  . Bipolar 1 disorder   . Schizophrenia, acute   . Seizures   . Acid reflux   . Anxiety     Past Surgical History  Procedure Laterality Date  . Abdominal surgery      Family History: History reviewed. No pertinent family history.  Social History:  reports that he has been smoking.  He does not have any smokeless tobacco history on file. He reports that he drinks alcohol. He reports that he does not  use illicit drugs.  Additional Social History:  Alcohol / Drug Use Pain Medications: na Prescriptions: na Over the Counter: na History of alcohol / drug use?: No history of alcohol / drug abuse  CIWA: CIWA-Ar BP: 135/73 mmHg Pulse Rate: 59 COWS:    Allergies:  Allergies  Allergen Reactions  . Benadryl [Diphenhydramine Hcl] Other (See Comments)    Home Medications:  (Not in a hospital admission)  OB/GYN Status:  No LMP for male patient.  General Assessment Data Location of Assessment: [facility] ED Is this a Tele or Face-to-Face Assessment?: Face-to-Face Is this an Initial Assessment or a Re-assessment for this encounter?: Initial Assessment Living Arrangements: Other  (Comment) Can pt return to current living arrangement?: No Admission Status: Involuntary Is patient capable of signing voluntary admission?: No Transfer from: Acute Hospital Referral Source: MD  Medical Screening Exam [facility] Walk-in ONLY) Medical Exam completed: Yes  [facility] Crisis Care Plan Living Arrangements: Other (Comment) Name of Psychiatrist: [facility] Name of Therapist: None  Education Status Is patient currently in school?: No Current Grade: na Highest grade of school patient has completed: na Name of school: na Contact person: na  Risk to self Suicidal Ideation: No Suicidal Intent: No Is patient at risk for suicide?: No Suicidal Plan?: No Specify Current Suicidal Plan: na Access to Means: No What has been your use of drugs/alcohol within the last 12 months?: na Previous Attempts/Gestures: Yes How many times?: 4 Other Self Harm Risks: na Triggers for Past Attempts: None known Intentional Self Injurious Behavior: None Family Suicide History: Unknown Recent stressful life event(s): Conflict (Comment) Persecutory voices/beliefs?: Yes Depression: Yes Depression Symptoms: Isolating;Fatigue;Guilt;Loss of interest in usual pleasures;Feeling worthless/self pity;Feeling angry/irritable Substance abuse history and/or treatment for substance abuse?: No Suicide prevention information given to non-admitted patients: Not applicable  Risk to Others Homicidal Ideation: Yes-Currently Present Thoughts of Harm to Others: Yes-Currently Present Comment - Thoughts of Harm to Others: aggressive, threatening in ED  Current Homicidal Intent: No-Not Currently/Within Last 6 Months Current Homicidal Plan: No-Not Currently/Within Last 6 Months Describe Current Homicidal Plan: none;  Access to Homicidal Means: Yes Describe Access to Homicidal Means: can get a knife Identified Victim: none; aggressive with peers in [facility] History of harm to others?: Yes Assessment of Violence: On  admission Violent Behavior Description: aggressive with police Does patient have access to weapons?: Yes (Comment) (can get a knife) Criminal Charges Pending?: No Does patient have a court date: No  Psychosis Hallucinations: None noted Delusions: Unspecified;Persecutory (talks to himself; bizarre, erratic)  Mental Status Report Appear/Hygiene: Disheveled Eye Contact: Fair Motor Activity: Restlessness;Hyperactivity Speech: Loud Level of Consciousness: Alert Mood: Depressed;Anxious;Suspicious;Labile;Angry;Irritable Affect: Angry;Anxious;Depressed;Irritable;Sad Anxiety Level: Panic Attacks Panic attack frequency: daily Most recent panic attack: 02-08-13 Thought Processes: Tangential;Relevant Judgement: Impaired Orientation: Person;Place;Situation Obsessive Compulsive Thoughts/Behaviors: Minimal  Cognitive Functioning Concentration: Decreased Memory: Recent Intact;Remote Intact IQ: Below Average Level of Function: unknown Insight: Poor Impulse Control: Poor Appetite: Good Weight Loss: 0 Weight Gain: 0 Sleep: Decreased Total Hours of Sleep: 3 Vegetative Symptoms: None  ADLScreening [facility] Assessment Services) Patient's cognitive ability adequate to safely complete daily activities?: Yes Patient able to express need for assistance with ADLs?: Yes Independently performs ADLs?: No  Prior Inpatient Therapy Prior Inpatient Therapy: Yes Prior Therapy Dates: Pt cannot recall Prior Therapy Facilty/Provider(s): Pt cannot recall Reason for Treatment: Unknown  Prior Outpatient Therapy Prior Outpatient Therapy: Yes Prior Therapy Dates: Over one year Prior Therapy Facilty/Provider(s): [facility] Reason for Treatment: med management  ADL Screening (condition at time of admission)  Patient's cognitive ability adequate to safely complete daily activities?: Yes Is the patient deaf or have difficulty hearing?: No Does the patient have difficulty seeing, even when wearing  glasses/contacts?: No Does the patient have difficulty concentrating, remembering, or making decisions?: Yes Patient able to express need for assistance with ADLs?: Yes Does the patient have difficulty dressing or bathing?: No Independently performs ADLs?: No Weakness of Legs: None Weakness of Arms/Hands: None  Home Assistive Devices/Equipment Home Assistive Devices/Equipment: None  Therapy Consults (therapy consults require a physician order) PT Evaluation Needed: No OT Evalulation Needed: No SLP Evaluation Needed: No Abuse/Neglect Assessment (Assessment to be complete while patient is alone) Physical Abuse: Denies Verbal Abuse: Denies Sexual Abuse: Denies Exploitation of patient/patient's resources: Denies Self-Neglect: Denies Values / Beliefs Cultural Requests During Hospitalization: None Spiritual Requests During Hospitalization: None Consults Spiritual Care Consult Needed: No Social Work Consult Needed: Yes (Comment) (placement may be an issue) [employer] officer (For Healthcare) Advance Directive: Patient does not have advance directive Pre-existing out of facility DNR order (yellow form or pink MOST form): No    Additional Information 1:1 In Past 12 Months?: No CIRT Risk: No Elopement Risk: No Does patient have medical clearance?: Yes     Disposition: Pt IVC due to aggression.  Pt may need inptx admit or just another group home that deals with more aggressive clients.  Psychiatry to follow up.  Disposition Initial Assessment Completed for this Encounter: Yes Disposition of Patient: Inpatient treatment program Type of inpatient treatment program: Adult Type of outpatient treatment: Adult Other disposition(s):  (na) Patient referred to: [facility]  On Site Evaluation by:   Reviewed with Physician:    Titus Mould, Eppie Gibson 02/08/2013 11:47 AM

## 2013-02-08 NOTE — ED Notes (Addendum)
Patient has two bags of belongings in locker 27. 

## 2013-02-08 NOTE — Consult Note (Signed)
[facility] Face-to-Face Psychiatry Consult   Reason for Consult:  Anger Referring Physician:  EDP  [name] is an 27 y.o. male.  Assessment: AXIS I:  Schizoaffective Disorder AXIS II:  Deferred AXIS III:   Past Medical History  Diagnosis Date  . Bipolar 1 disorder   . Schizophrenia, acute   . Seizures   . Acid reflux   . Anxiety    AXIS IV:  other psychosocial or environmental problems and problems with primary support group AXIS V:  21-30 behavior considerably influenced by delusions or hallucinations OR serious impairment in judgment, communication OR inability to function in almost all areas  Plan:  Patient requires further observation.  Subjective:   [name] is a 27 y.o. male patient admitted with anger outbursts.[name]  HPI:  Patient is 27 year old African American man who has multiple visits to the emergency room because of his anger and aggressive behavior.  He is coming to the emergency room for past few days for the same issue however not meet criteria for inpatient and being discharged.  This morning patient was again brought in by the staff a group home as patient was very agitated, threatening to the staff and knocked on the door.  Initially patient refused to come into the emergency room however some encouragement he was finally walked in.  In the emergency room he was noticed very agitated, threatening and to start using profanity.  He was given Geodon 20 mg IM and put him in a restraint.  He was IVC.  Later patient was much calmer and start giggling and laughing.  Patient admitted that he is not happy at the group home because something he feels people are reading his mind.  He admitted frustration because he has been moved to the multiple group home.  When I ask about his behavior in the group home he admitted being agitated because sometime he has anger problem.  He endorse that maybe his current medication is not working.  He is on Depakote, lithium, Klonopin.  Patient  denies any hallucination however appears giggling and laughing to himself.  Unclear if this respond to internal stimuli.  Patient remains very guarded and difficult to cooperate .   HPI Elements:   Location:  Emergency room. Quality:  poor. Severity:  Moderate.  Past Psychiatric History: Past Medical History  Diagnosis Date  . Bipolar 1 disorder   . Schizophrenia, acute   . Seizures   . Acid reflux   . Anxiety     reports that he has been smoking.  He does not have any smokeless tobacco history on file. He reports that he drinks alcohol. He reports that he does not use illicit drugs. History reviewed. No pertinent family history.         Allergies:   Allergies  Allergen Reactions  . Benadryl [Diphenhydramine Hcl] Other (See Comments)    ACT Assessment Complete:  Yes:    Educational Status    Risk to Self: Risk to self Is patient at risk for suicide?: No, but patient needs Medical Clearance Substance abuse history and/or treatment for substance abuse?: No  Risk to Others:    Abuse:    Prior Inpatient Therapy:    Prior Outpatient Therapy:    Additional Information:                    Objective: Blood pressure 135/73, pulse 59, temperature 98.9 F (37.2 C), temperature source Oral, resp. rate 20, SpO2 99.00%.There is no weight  on file to calculate BMI. Results for orders placed during the hospital encounter of 02/05/13 (from the past 72 hour(s))  CBC WITH DIFFERENTIAL     Status: None   Collection Time    02/05/13  8:44 PM      Result Value Range   WBC 7.2  4.0 - 10.5 K/uL   RBC 4.58  4.22 - 5.81 MIL/uL   Hemoglobin 13.9  13.0 - 17.0 g/dL   HCT 46.9  62.9 - 52.8 %   MCV 94.1  78.0 - 100.0 fL   MCH 30.3  26.0 - 34.0 pg   MCHC 32.3  30.0 - 36.0 g/dL   RDW 41.3  24.4 - 01.0 %   Platelets 209  150 - 400 K/uL   Neutrophils Relative % 51  43 - 77 %   Neutro Abs 3.7  1.7 - 7.7 K/uL   Lymphocytes Relative 36  12 - 46 %   Lymphs Abs 2.5  0.7 - 4.0 K/uL    Monocytes Relative 10  3 - 12 %   Monocytes Absolute 0.7  0.1 - 1.0 K/uL   Eosinophils Relative 3  0 - 5 %   Eosinophils Absolute 0.2  0.0 - 0.7 K/uL   Basophils Relative 0  0 - 1 %   Basophils Absolute 0.0  0.0 - 0.1 K/uL  BASIC METABOLIC PANEL     Status: Abnormal   Collection Time    02/05/13  8:44 PM      Result Value Range   Sodium 143  135 - 145 mEq/L   Potassium 3.1 (*) 3.5 - 5.1 mEq/L   Chloride 105  96 - 112 mEq/L   CO2 31  19 - 32 mEq/L   Glucose, Bld 99  70 - 99 mg/dL   BUN 10  6 - 23 mg/dL   Creatinine, Ser 2.72  0.50 - 1.35 mg/dL   Calcium 9.9  8.4 - 53.6 mg/dL   GFR calc non Af Amer >90  >90 mL/min   GFR calc Af Amer >90  >90 mL/min   Comment: (NOTE)     The eGFR has been calculated using the CKD EPI equation.     This calculation has not been validated in all clinical situations.     eGFR's persistently <90 mL/min signify possible Chronic Kidney     Disease.  [name]     Status: None   Collection Time    02/05/13  8:44 PM      Result Value Range   Alcohol, Ethyl (B) <11  0 - 11 mg/dL   Comment:            LOWEST DETECTABLE LIMIT FOR     SERUM ALCOHOL IS 11 mg/dL     FOR MEDICAL PURPOSES ONLY  VALPROIC ACID LEVEL     Status: Abnormal   Collection Time    02/05/13  8:44 PM      Result Value Range   Valproic Acid Lvl 109.2 (*) 50.0 - 100.0 ug/mL  URINALYSIS, ROUTINE W REFLEX MICROSCOPIC     Status: Abnormal   Collection Time    02/05/13  8:51 PM      Result Value Range   Color, Urine YELLOW  YELLOW   APPearance CLEAR  CLEAR   Specific Gravity, Urine >1.030 (*) 1.005 - 1.030   pH 6.0  5.0 - 8.0   Glucose, UA NEGATIVE  NEGATIVE mg/dL   Hgb urine dipstick NEGATIVE  NEGATIVE   Bilirubin Urine  NEGATIVE  NEGATIVE   Ketones, ur TRACE (*) NEGATIVE mg/dL   Protein, ur 30 (*) NEGATIVE mg/dL   Urobilinogen, UA 0.2  0.0 - 1.0 mg/dL   Nitrite NEGATIVE  NEGATIVE   Leukocytes, UA NEGATIVE  NEGATIVE  URINE RAPID DRUG SCREEN (HOSP PERFORMED)     Status: None    Collection Time    02/05/13  8:51 PM      Result Value Range   Opiates NONE DETECTED  NONE DETECTED   Cocaine NONE DETECTED  NONE DETECTED   Benzodiazepines NONE DETECTED  NONE DETECTED   Amphetamines NONE DETECTED  NONE DETECTED   Tetrahydrocannabinol NONE DETECTED  NONE DETECTED   Barbiturates NONE DETECTED  NONE DETECTED   Comment:            DRUG SCREEN FOR MEDICAL PURPOSES     ONLY.  IF CONFIRMATION IS NEEDED     FOR ANY PURPOSE, NOTIFY LAB     WITHIN 5 DAYS.                LOWEST DETECTABLE LIMITS     FOR URINE DRUG SCREEN     Drug Class       Cutoff (ng/mL)     Amphetamine      1000     Barbiturate      200     Benzodiazepine   200     Tricyclics       300     Opiates          300     Cocaine          300     THC              50  URINE MICROSCOPIC-ADD ON     Status: Abnormal   Collection Time    02/05/13  8:51 PM      Result Value Range   Squamous Epithelial / LPF RARE  RARE   WBC, UA 11-20  <3 WBC/hpf   Bacteria, UA FEW (*) RARE   Casts WBC CAST (*) NEGATIVE  URINE CULTURE     Status: None   Collection Time    02/05/13  8:51 PM      Result Value Range   Specimen Description URINE, CLEAN CATCH     Special Requests NONE     Culture  Setup Time       Value: 02/05/2013 21:40     Performed at [facility] Count       Value: NO GROWTH     Performed at [facility]   Culture       Value: NO GROWTH     Performed at [facility]   Report Status 02/07/2013 FINAL     Labs are reviewed and are pending.  Current Facility-Administered Medications  Medication Dose Route Frequency Provider Last Rate Last Dose  . benztropine (COGENTIN) tablet 2 mg  2 mg Oral BID Suzi Roots, MD      . divalproex (DEPAKOTE) DR tablet 1,000 mg  1,000 mg Oral BID Suzi Roots, MD      . Melene Muller ON 02/09/2013] levothyroxine (SYNTHROID, LEVOTHROID) tablet 25 mcg  25 mcg Oral QAC breakfast Suzi Roots, MD      . lithium carbonate capsule 300 mg  300 mg  Oral Daily Suzi Roots, MD      . LORazepam (ATIVAN) tablet 1 mg  1 mg Oral TID PRN  Suzi Roots, MD       Current Outpatient Prescriptions  Medication Sig Dispense Refill  . benztropine (COGENTIN) 2 MG tablet Take 2 mg by mouth 2 (two) times daily.      . clonazePAM (KLONOPIN) 1 MG tablet Take 1 mg by mouth 2 (two) times daily as needed for anxiety.      . divalproex (DEPAKOTE) 500 MG DR tablet Take 1,000 mg by mouth 2 (two) times daily.      [name] ibuprofen (ADVIL,MOTRIN) 800 MG tablet Take 1 tablet (800 mg total) by mouth 3 (three) times daily.  21 tablet  0  . levothyroxine (SYNTHROID, LEVOTHROID) 25 MCG tablet Take 25 mcg by mouth daily before breakfast.      . lithium carbonate 300 MG capsule Take 300 mg by mouth daily.      [name] LORazepam (ATIVAN) 1 MG tablet Take 1 mg by mouth 3 (three) times daily as needed for anxiety.      [name] omeprazole (PRILOSEC) 20 MG capsule Take 20 mg by mouth daily.        Psychiatric Specialty Exam:     Blood pressure 135/73, pulse 59, temperature 98.9 F (37.2 C), temperature source Oral, resp. rate 20, SpO2 99.00%.There is no weight on file to calculate BMI.  General Appearance: Guarded  Eye Contact::  Poor  Speech:  Pressured  Volume:  Increased  Mood:  Angry and Irritable  Affect:  Constricted and Flat  Thought Process:  Loose  Orientation:  Full (Time, Place, and Person)  Thought Content:  Paranoid Ideation, Rumination and Seen giggling and laughing may respond to internal stimuli  Suicidal Thoughts:  No  Homicidal Thoughts:  No  Memory:  Fair  Judgement:  Impaired  Insight:  Lacking  Psychomotor Activity:  Increased  Concentration:  Poor  Recall:  Fair  Akathisia:  No  Handed:  Right  AIMS (if indicated):     Assets:  Housing  Sleep:      Treatment Plan Summary: Medication management Patient requires further observation.  I discussed with him to starting Haldol since he has taken in the past.  He is also willing to get Haldol Decanoate  upon discharge.  We will observe him overnight.  We will order lithium level, Depakote level and routine blood work including UDS.  Zeb Rawl T. 02/08/2013 11:31 AM

## 2013-02-08 NOTE — ED Notes (Signed)
Pt ambulatory from [facility]

## 2013-02-08 NOTE — ED Notes (Signed)
Up in room staring at the wall laughing

## 2013-02-08 NOTE — ED Notes (Signed)
Patient presents calm and cooperative, denies any current thoughts of harm, denies any auditory or visual hallucinations ( patient observed laughing hysterically and looking at wall), denies any current pain.

## 2013-02-08 NOTE — ED Provider Notes (Signed)
CSN: [id number]     Arrival date & time 02/08/13  0945 History   First MD Initiated Contact with Patient 02/08/13 1000     Chief Complaint  Patient presents with  . Medical Clearance   (Consider location/radiation/quality/duration/timing/severity/associated sxs/prior Treatment) The history is provided by the patient. The history is limited by the condition of the patient.  pt with hx schizophrenia, bipolar disorder, who presents from his group home. Pt states this morning he got upset about 'something', and became angry.  He denies hurting anyone, and states he is not having any thoughts about hurting anyone.  Pt is very limited historian, choosing not to answer most questions - level 5 caveat. ?compliance w meds.      Past Medical History  Diagnosis Date  . Bipolar 1 disorder   . Schizophrenia, acute   . Seizures   . Acid reflux   . Anxiety    Past Surgical History  Procedure Laterality Date  . Abdominal surgery     History reviewed. No pertinent family history. History  Substance Use Topics  . Smoking status: Current Every Day Smoker  . Smokeless tobacco: Not on file  . Alcohol Use: Yes    Review of Systems  Unable to perform ROS: Psychiatric disorder  level 5 caveat.    Allergies  Benadryl  Home Medications   Current Outpatient Rx  Name  Route  Sig  Dispense  Refill  . benztropine (COGENTIN) 2 MG tablet   Oral   Take 2 mg by mouth 2 (two) times daily.         . clonazePAM (KLONOPIN) 1 MG tablet   Oral   Take 1 mg by mouth 2 (two) times daily as needed for anxiety.         . divalproex (DEPAKOTE) 500 MG DR tablet   Oral   Take 1,000 mg by mouth 2 (two) times daily.         [name] ibuprofen (ADVIL,MOTRIN) 800 MG tablet   Oral   Take 1 tablet (800 mg total) by mouth 3 (three) times daily.   21 tablet   0   . levothyroxine (SYNTHROID, LEVOTHROID) 25 MCG tablet   Oral   Take 25 mcg by mouth daily before breakfast.         . lithium 600 MG  capsule   Oral   Take 600 mg by mouth 2 (two) times daily.          [name] LORazepam (ATIVAN) 1 MG tablet   Oral   Take 1 mg by mouth 3 (three) times daily as needed for anxiety.         [name] omeprazole (PRILOSEC) 20 MG capsule   Oral   Take 20 mg by mouth daily.          BP 135/73  Pulse 59  Temp(Src) 98.9 F (37.2 C) (Oral)  Resp 20  SpO2 99% Physical Exam  Nursing note and vitals reviewed. Constitutional: He appears well-developed and well-nourished. No distress.  HENT:  Head: Atraumatic.  Eyes: Conjunctivae are normal.  Neck: Neck supple. No tracheal deviation present.  Cardiovascular: Normal rate, regular rhythm, normal heart sounds and intact distal pulses.   Pulmonary/Chest: Effort normal and breath sounds normal. No accessory muscle usage. No respiratory distress.  Abdominal: Soft. He exhibits no distension. There is no tenderness.  Musculoskeletal: Normal range of motion.  Neurological: He is alert.  Alert, oriented to person/place. Steady gait.   Skin: Skin is warm and dry.  Psychiatric:  Pt somewhat withdrawn, answers only certain questions. Denies thoughts of harm to others.     ED Course  Procedures (including critical care time)  Results for orders placed during the hospital encounter of 02/08/13  CBC WITH DIFFERENTIAL      Result Value Range   WBC 5.4  4.0 - 10.5 K/uL   RBC 4.22  4.22 - 5.81 MIL/uL   Hemoglobin 12.6 (*) 13.0 - 17.0 g/dL   HCT 54.0  98.1 - 19.1 %   MCV 93.4  78.0 - 100.0 fL   MCH 29.9  26.0 - 34.0 pg   MCHC 32.0  30.0 - 36.0 g/dL   RDW 47.8  29.5 - 62.1 %   Platelets 201  150 - 400 K/uL   Neutrophils Relative % 52  43 - 77 %   Neutro Abs 2.8  1.7 - 7.7 K/uL   Lymphocytes Relative 34  12 - 46 %   Lymphs Abs 1.8  0.7 - 4.0 K/uL   Monocytes Relative 12  3 - 12 %   Monocytes Absolute 0.6  0.1 - 1.0 K/uL   Eosinophils Relative 2  0 - 5 %   Eosinophils Absolute 0.1  0.0 - 0.7 K/uL   Basophils Relative 0  0 - 1 %   Basophils Absolute  0.0  0.0 - 0.1 K/uL  COMPREHENSIVE METABOLIC PANEL      Result Value Range   Sodium 139  135 - 145 mEq/L   Potassium 4.0  3.5 - 5.1 mEq/L   Chloride 104  96 - 112 mEq/L   CO2 28  19 - 32 mEq/L   Glucose, Bld 99  70 - 99 mg/dL   BUN 11  6 - 23 mg/dL   Creatinine, Ser 3.08  0.50 - 1.35 mg/dL   Calcium 9.9  8.4 - 65.7 mg/dL   Total Protein 6.8  6.0 - 8.3 g/dL   Albumin 3.7  3.5 - 5.2 g/dL   AST 20  0 - 37 U/L   ALT 15  0 - 53 U/L   Alkaline Phosphatase 45  39 - 117 U/L   Total Bilirubin 0.4  0.3 - 1.2 mg/dL   GFR calc non Af Amer >90  >90 mL/min   GFR calc Af Amer >90  >90 mL/min  [name]      Result Value Range   Alcohol, Ethyl (B) <11  0 - 11 mg/dL  LITHIUM LEVEL      Result Value Range   Lithium Lvl 0.97  0.80 - 1.40 mEq/L     EKG Interpretation   None       MDM  Reviewed nursing notes and prior charts for additional history.   Pt currently calm, alert, and denies thoughts of harm to self or others.   Our psych team is coming to further assess pt.   Upon psych team eval, pt became belligerent, threatening and aggressive. Psych team has medicated pt w geodon, and completed ivc.   They indicate that given recurrent bouncing back from group to ED, and from one group home to next, w recurrent behavioral issues, they feel pt may need additional psychiatric medication management, and possibly placement into a more structured environment - further eval/dispo/medications per psych team.      Suzi Roots, MD 02/08/13 [phone]

## 2013-02-08 NOTE — ED Notes (Signed)
Up to the bathroom 

## 2013-02-08 NOTE — ED Notes (Signed)
Pt lying in bed sleeping at this time.  

## 2013-02-08 NOTE — ED Notes (Signed)
Pt up in bed, alert oriented watching tv. Eating sandwhich and drinking coffee. Calm cooperative.

## 2013-02-08 NOTE — ED Notes (Signed)
This rather manic-acting pt. Is brought to [place] from a group home by a gentleman who knows little about the pt. He is agitated; and so far we are able to direct him.

## 2013-02-09 DIAGNOSIS — F209 Schizophrenia, unspecified: Secondary | ICD-10-CM

## 2013-02-09 LAB — GLUCOSE, CAPILLARY: Glucose-Capillary: 96 mg/dL (ref 70–99)

## 2013-02-09 MED ORDER — HALOPERIDOL DECANOATE 100 MG/ML IM SOLN
75.0000 mg | Freq: Once | INTRAMUSCULAR | Status: AC
  Administered 2013-02-09: 75 mg via INTRAMUSCULAR
  Filled 2013-02-09: qty 0.75

## 2013-02-09 MED ORDER — HALOPERIDOL 5 MG PO TABS: 5.0000 mg | ORAL_TABLET | Freq: Every day | ORAL | Status: DC

## 2013-02-09 NOTE — Progress Notes (Signed)
Patient ID: [name], male   DOB: 10-06-1985, 27 y.o.   MRN: [id number] Patient Identification:  Conlan Miceli Date of Evaluation:  02/09/2013   History of Present Illness:  Patient was brought in from his group home for aggressive behavior towards staff and other client at his group home.  He has been visiting the ER lately almost every day.  Noted he has not been taking his medications at the group home.   Today patient reports good night sleep, auditory hallucination decreased since he started on Haldol.  He is still a bit loud and laughing inappropriately.  Patient denies SI/HI/AVH and does not exhibit any paranoia.  He states he will "like something to control my mood, I want to stop losing my mind and doing bad stuff"  Patient is already on Depakote.  We will administer Haldol decanoate this am.  We plan to send him back to his former group home if they will accept him back.  We will also start looking for a new group home for him.   Past Psychiatric History: Schizophrenia, Schizoaffective d/o   Past Medical History:     Past Medical History  Diagnosis Date  . Bipolar 1 disorder   . Schizophrenia, acute   . Seizures   . Acid reflux   . Anxiety        Past Surgical History  Procedure Laterality Date  . Abdominal surgery      Allergies:  Allergies  Allergen Reactions  . Benadryl [Diphenhydramine Hcl] Other (See Comments)    Current Medications:  Prior to Admission medications   Medication Sig Start Date End Date Taking? Authorizing Provider  benztropine (COGENTIN) 2 MG tablet Take 2 mg by mouth 2 (two) times daily.   Yes Historical Provider, MD  clonazePAM (KLONOPIN) 1 MG tablet Take 1 mg by mouth 2 (two) times daily as needed for anxiety.   Yes Historical Provider, MD  divalproex (DEPAKOTE) 500 MG DR tablet Take 1,000 mg by mouth 2 (two) times daily.   Yes Historical Provider, MD  ibuprofen (ADVIL,MOTRIN) 800 MG tablet Take 1 tablet (800 mg total) by mouth 3 (three)  times daily. 12/13/12  Yes Jennifer L Piepenbrink, PA-C  levothyroxine (SYNTHROID, LEVOTHROID) 25 MCG tablet Take 25 mcg by mouth daily before breakfast.   Yes Historical Provider, MD  lithium carbonate 300 MG capsule Take 300 mg by mouth daily.   Yes Historical Provider, MD  LORazepam (ATIVAN) 1 MG tablet Take 1 mg by mouth 3 (three) times daily as needed for anxiety.   Yes Historical Provider, MD  omeprazole (PRILOSEC) 20 MG capsule Take 20 mg by mouth daily.    Historical Provider, MD    Social History:    reports that he has been smoking.  He does not have any smokeless tobacco history on file. He reports that he drinks alcohol. He reports that he does not use illicit drugs.   Family History:    History reviewed. No pertinent family history.  Mental Status Examination/Evaluation:Psychiatric Specialty Exam: Physical Exam  ROS  Blood pressure 102/58, pulse 70, temperature 98.2 F (36.8 C), temperature source Oral, resp. rate 20, SpO2 98.00%.There is no weight on file to calculate BMI.  General Appearance: Casual  Eye Contact::  Good  Speech:  Clear and Coherent and Normal Rate  Volume:  Increased  Mood:  Euphoric  Affect:  Appropriate and Congruent  Thought Process:  Goal Directed and Intact  Orientation:  Full (Time, Place, and Person)  Thought Content:  Hallucinations: Auditory  Suicidal Thoughts:  No  Homicidal Thoughts:  No  Memory:  Immediate;   Good Recent;   Good Remote;   Good  Judgement:  Poor  Insight:  Shallow  Psychomotor Activity:  Normal  Concentration:  Fair  Recall:  NA  Akathisia:  NA  Handed:  Right  AIMS (if indicated):     Assets:  Desire for Improvement Housing  Sleep:          DIAGNOSIS:   AXIS I   Schizoaffective d/o bipolar type, Schizophrenia  AXIS II  Deffered  AXIS III See medical notes.  AXIS IV educational problems, housing problems, occupational problems, other psychosocial or environmental problems, problems related to social  environment and problems with primary support group  AXIS V 61-70 mild symptoms     Assessment/Plan: Face to face interview with Dr Lolly Mustache We will give patient Haldol Decanoate injection this am We plan to contact his group home for his return back to their facility We will work with our SW to look for new placement. Dahlia Byes  PMHNP-BC  Patient seen chart reviewed.  He had a good night sleep.  He was not a management problem.  He feels Haldol helped him.I have personally seen the patient and agreed with the findings and involved in the treatment plan. Kathryne Sharper, MD

## 2013-02-09 NOTE — Progress Notes (Addendum)
CSW left message with group home owner at [phone]. CSW left message for [facility] at [phone]  .Catha Gosselin, [place] [phone]  ED CSW 02/09/2013 932am

## 2013-02-09 NOTE — ED Notes (Signed)
Pt is awake and alert, pleasant and cooperative. Patient denies HI, SI AH or VH. Discharge vitals 121/79 HR 93 RR 16 and unlabored. Pt has outpatient treatment scheduled. Will continue to monitor for safety. Patient escorted to lobby without incident. T.Melvyn Neth RN

## 2013-02-09 NOTE — Progress Notes (Signed)
Pt care coordinator Bynum Bellows [phone]) will have to schedule patient for follow up appointment due to pt medicaid. This Clinical research associate unable to schedule. Please follow up with care coordinator.   Catha Gosselin, LCSW [phone]  ED CSW .02/09/2013 1400pm

## 2013-02-09 NOTE — ED Provider Notes (Signed)
5:45 PM Have been informed that pt safe for d/c to group home and will have outpt f/u. Return precautions given for new or worsening symptoms including concern for the safety of himself or others. I was not directly involved in pt's care or disposition.  1. Schizoaffective disorder, bipolar type       Shanna Cisco, MD 02/09/13 1946

## 2013-02-09 NOTE — Progress Notes (Addendum)
CSW spoke with group home manager, Ms. Luciana Axe who stated that patient can return if medications adjusted. CSW discussed the pt is added injection of 75 mg of haldol deconate. Group home requested medication to assist with pt sleep. Per group home, pt can be transported back to group home around 330pm today.   Frutoso Schatz [phone]  ED CSW 02/09/2013 11:09am   [place] with [employer], where pt medicaid is handled who stated that they will make follow up appointment with [facility] for follow up outpatient care. CSW spoke with [employer] who stated that patient care coordinator is trying to arrange pt and outpatient provider. CSW left message for Bynum Bellows at [phone] to arrange outpatient follow up, and to discuss patient group home situation. Per discussion with [employer], their chart shows that pt care coordinator is trying to arrange outpatient with [facility], and [facility], and an act team.   Per discussion with Tresa Endo from [facility], patient is not D/D and is not followed by [facility].    CSW discussed medication requests from the group home. Per discussion with psychiatrist and NP, patient will be discharged on haldol 5 mg at night to help with sleep as well as haldol 75mg   injection to be given 1x a month.   Catha Gosselin, LCSW [phone]  ED CSW .02/09/2013 1109am   CSW left message with pt group home to discuss that patient care coordinator will schedule follow up appointment At this time, csw unable to make appointment as pt lme has to make appointment due to [facility]. Pt medications adjusted, haldol injection 75 mg 1x per month. Last dose 02/08/2013. Pt also to have haldol 5mg  qhs for sleep.   Catha Gosselin, LCSW [phone]  ED CSW .02/09/2013 13:43pm   CSW spoke with pt group home owner, pt accepted back to group home. Transportation to be provided at 330pm. RN to call EDP to  discharge patient once transportation arrives for patient.   Catha Gosselin, LCSW [phone]  ED CSW .02/09/2013 1109am

## 2013-02-11 ENCOUNTER — Emergency Department (HOSPITAL_COMMUNITY)
Admission: EM | Admit: 2013-02-11 | Discharge: 2013-02-11 | Disposition: A | Discharge status: Home / Self Care | Payer: Medicaid Other | Attending: Emergency Medicine | Admitting: Emergency Medicine

## 2013-02-11 ENCOUNTER — Encounter (HOSPITAL_COMMUNITY): Payer: Self-pay | Admitting: Emergency Medicine

## 2013-02-11 DIAGNOSIS — K219 Gastro-esophageal reflux disease without esophagitis: Secondary | ICD-10-CM | POA: Insufficient documentation

## 2013-02-11 DIAGNOSIS — F17200 Nicotine dependence, unspecified, uncomplicated: Secondary | ICD-10-CM | POA: Insufficient documentation

## 2013-02-11 DIAGNOSIS — F319 Bipolar disorder, unspecified: Secondary | ICD-10-CM | POA: Insufficient documentation

## 2013-02-11 DIAGNOSIS — R112 Nausea with vomiting, unspecified: Secondary | ICD-10-CM | POA: Insufficient documentation

## 2013-02-11 DIAGNOSIS — Z79899 Other long term (current) drug therapy: Secondary | ICD-10-CM | POA: Insufficient documentation

## 2013-02-11 DIAGNOSIS — R197 Diarrhea, unspecified: Secondary | ICD-10-CM

## 2013-02-11 DIAGNOSIS — F411 Generalized anxiety disorder: Secondary | ICD-10-CM | POA: Insufficient documentation

## 2013-02-11 DIAGNOSIS — Z791 Long term (current) use of non-steroidal anti-inflammatories (NSAID): Secondary | ICD-10-CM | POA: Insufficient documentation

## 2013-02-11 DIAGNOSIS — G40909 Epilepsy, unspecified, not intractable, without status epilepticus: Secondary | ICD-10-CM | POA: Insufficient documentation

## 2013-02-11 DIAGNOSIS — R630 Anorexia: Secondary | ICD-10-CM | POA: Insufficient documentation

## 2013-02-11 DIAGNOSIS — R51 Headache: Secondary | ICD-10-CM | POA: Insufficient documentation

## 2013-02-11 DIAGNOSIS — F2089 Other schizophrenia: Secondary | ICD-10-CM | POA: Insufficient documentation

## 2013-02-11 MED ORDER — ONDANSETRON HCL 4 MG PO TABS: 4.0000 mg | ORAL_TABLET | Freq: Four times a day (QID) | ORAL | Status: DC

## 2013-02-11 NOTE — ED Notes (Signed)
Patient given discharge instruction, verbalized understand. Patient ambulatory out of the department. Called group home for a ride for pt. Security advised pt will be in waiting area. Pt asking for food and drink.

## 2013-02-11 NOTE — ED Provider Notes (Signed)
CSN: [id number]     Arrival date & time 02/11/13  2147 History   First MD Initiated Contact with Patient 02/11/13 2219   Scribed for Raeford Razor, MD, the patient was seen in room APA16A/APA16A. This chart was scribed by Lewanda Rife, ED scribe. Patient's care was started at 10:47 PM   Chief Complaint  Patient presents with  . Abdominal Pain  . Emesis  . Diarrhea   (Consider location/radiation/quality/duration/timing/severity/associated sxs/prior Treatment) The history is provided by the patient. No language interpreter was used.   HPI Comments: Tc Kapusta is a 27 y.o. male who presents to the Emergency Department complaining of constant moderate diffuse abdominal pain onset 5 days. Reports associated nausea, emesis, diarrhea, decreased appetite, and headahce. Reports taking ibuprofen with moderate relief of symptoms. Denies any aggravating factors. Denies associated fever. Denies sick contacts.   Past Medical History  Diagnosis Date  . Bipolar 1 disorder   . Schizophrenia, acute   . Seizures   . Acid reflux   . Anxiety    Past Surgical History  Procedure Laterality Date  . Abdominal surgery     No family history on file. History  Substance Use Topics  . Smoking status: Current Every Day Smoker  . Smokeless tobacco: Not on file  . Alcohol Use: Yes    Review of Systems  Gastrointestinal: Positive for vomiting, abdominal pain and diarrhea.  All other systems reviewed and are negative.  A complete 10 system review of systems was obtained and all systems are negative except as noted in the HPI and PMHx.     Allergies  Benadryl  Home Medications   Current Outpatient Rx  Name  Route  Sig  Dispense  Refill  . benztropine (COGENTIN) 2 MG tablet   Oral   Take 2 mg by mouth 2 (two) times daily.         . clonazePAM (KLONOPIN) 1 MG tablet   Oral   Take 1 mg by mouth 2 (two) times daily as needed for anxiety.         . divalproex (DEPAKOTE) 500 MG DR  tablet   Oral   Take 1,000 mg by mouth 2 (two) times daily.         [name] ibuprofen (ADVIL,MOTRIN) 800 MG tablet   Oral   Take 1 tablet (800 mg total) by mouth 3 (three) times daily.   21 tablet   0   . levothyroxine (SYNTHROID, LEVOTHROID) 25 MCG tablet   Oral   Take 25 mcg by mouth daily before breakfast.         . lithium carbonate 300 MG capsule   Oral   Take 300 mg by mouth daily.         [name] LORazepam (ATIVAN) 1 MG tablet   Oral   Take 1 mg by mouth 3 (three) times daily as needed for anxiety.         [name] omeprazole (PRILOSEC) 20 MG capsule   Oral   Take 20 mg by mouth daily.          BP 150/90  Pulse 68  Temp(Src) 98.3 F (36.8 C) (Oral)  Resp 17  Wt 215 lb (97.523 kg)  BMI 25.49 kg/m2  SpO2 100% Physical Exam  Nursing note and vitals reviewed. Constitutional: He appears well-developed and well-nourished. No distress.  HENT:  Head: Normocephalic and atraumatic.  Eyes: Conjunctivae are normal. Right eye exhibits no discharge. Left eye exhibits no discharge.  Neck: Neck supple.  Cardiovascular:  Normal rate, regular rhythm and normal heart sounds.  Exam reveals no gallop and no friction rub.   No murmur heard. Pulmonary/Chest: Effort normal and breath sounds normal. No respiratory distress.  Abdominal: Soft. He exhibits no distension. There is no tenderness.  Musculoskeletal: He exhibits no edema and no tenderness.  Neurological: He is alert.  Skin: Skin is warm and dry.  Psychiatric: He has a normal mood and affect. His behavior is normal. Thought content normal.    ED Course  Procedures (including critical care time) COORDINATION OF CARE:  Nursing notes reviewed. Vital signs reviewed. Initial pt interview and examination performed.   Treatment plan initiated:Medications - No data to display   Initial diagnostic testing ordered.    Labs Review Labs Reviewed - No data to display Imaging Review No results found.  EKG Interpretation   None        MDM   1. Nausea vomiting and diarrhea    27yM with possible viral illness. Benign abdominal exam. Appears well. Hd stable. Symptomatic tx. outpt FU.   I personally preformed the services scribed in my presence. The recorded information has been reviewed is accurate. Raeford Razor, MD.    Raeford Razor, MD 02/12/13 1440

## 2013-02-11 NOTE — ED Notes (Signed)
Pt ambulatory to the bathroom 

## 2013-02-11 NOTE — ED Notes (Signed)
Pt is listening to cd player and singing in room.

## 2013-02-11 NOTE — ED Notes (Signed)
Pt in sitting on side of rapping, pt laughing and talking.

## 2013-02-11 NOTE — ED Notes (Signed)
Pt c/o abd pain with vomiting and diarrhea x 5 days.

## 2013-02-11 NOTE — ED Notes (Signed)
MD at the bedside, pt very talkative, pt states there is nothing to do at the group home.

## 2013-02-13 ENCOUNTER — Emergency Department (HOSPITAL_COMMUNITY)
Admission: EM | Admit: 2013-02-13 | Discharge: 2013-02-19 | Disposition: A | Discharge status: Other Acute Inpatient Hospital | Payer: Medicaid Other | Attending: Emergency Medicine | Admitting: Emergency Medicine

## 2013-02-13 ENCOUNTER — Encounter (HOSPITAL_COMMUNITY): Payer: Self-pay | Admitting: Emergency Medicine

## 2013-02-13 DIAGNOSIS — F29 Unspecified psychosis not due to a substance or known physiological condition: Secondary | ICD-10-CM | POA: Insufficient documentation

## 2013-02-13 DIAGNOSIS — K219 Gastro-esophageal reflux disease without esophagitis: Secondary | ICD-10-CM | POA: Insufficient documentation

## 2013-02-13 DIAGNOSIS — Z79899 Other long term (current) drug therapy: Secondary | ICD-10-CM | POA: Insufficient documentation

## 2013-02-13 DIAGNOSIS — F250 Schizoaffective disorder, bipolar type: Secondary | ICD-10-CM

## 2013-02-13 DIAGNOSIS — R451 Restlessness and agitation: Secondary | ICD-10-CM

## 2013-02-13 DIAGNOSIS — G40909 Epilepsy, unspecified, not intractable, without status epilepticus: Secondary | ICD-10-CM | POA: Insufficient documentation

## 2013-02-13 DIAGNOSIS — F319 Bipolar disorder, unspecified: Secondary | ICD-10-CM | POA: Insufficient documentation

## 2013-02-13 DIAGNOSIS — F060 Psychotic disorder with hallucinations due to known physiological condition: Secondary | ICD-10-CM

## 2013-02-13 DIAGNOSIS — F259 Schizoaffective disorder, unspecified: Secondary | ICD-10-CM | POA: Insufficient documentation

## 2013-02-13 DIAGNOSIS — Z791 Long term (current) use of non-steroidal anti-inflammatories (NSAID): Secondary | ICD-10-CM | POA: Insufficient documentation

## 2013-02-13 DIAGNOSIS — F911 Conduct disorder, childhood-onset type: Secondary | ICD-10-CM | POA: Insufficient documentation

## 2013-02-13 DIAGNOSIS — IMO0002 Reserved for concepts with insufficient information to code with codable children: Secondary | ICD-10-CM | POA: Insufficient documentation

## 2013-02-13 DIAGNOSIS — F411 Generalized anxiety disorder: Secondary | ICD-10-CM | POA: Insufficient documentation

## 2013-02-13 DIAGNOSIS — R4689 Other symptoms and signs involving appearance and behavior: Secondary | ICD-10-CM

## 2013-02-13 LAB — VALPROIC ACID LEVEL: Valproic Acid Lvl: 62.6 ug/mL (ref 50.0–100.0)

## 2013-02-13 LAB — RAPID URINE DRUG SCREEN, HOSP PERFORMED
Amphetamines: NOT DETECTED
Barbiturates: NOT DETECTED
Benzodiazepines: NOT DETECTED

## 2013-02-13 LAB — BASIC METABOLIC PANEL
BUN: 11 mg/dL (ref 6–23)
CO2: 26 mEq/L (ref 19–32)
Calcium: 9.6 mg/dL (ref 8.4–10.5)
Chloride: 105 mEq/L (ref 96–112)
Creatinine, Ser: 0.86 mg/dL (ref 0.50–1.35)
GFR calc Af Amer: >90 mL/min (ref >90)
Potassium: 4.1 mEq/L (ref 3.5–5.1)

## 2013-02-13 LAB — CBC WITH DIFFERENTIAL/PLATELET
Basophils Relative: 0 % (ref 0–1)
Eosinophils Relative: 1 % (ref 0–5)
HCT: 38.8 % — ABNORMAL LOW (ref 39.0–52.0)
Hemoglobin: 12.2 g/dL — ABNORMAL LOW (ref 13.0–17.0)
Lymphocytes Relative: 31 % (ref 12–46)
MCHC: 31.4 g/dL (ref 30.0–36.0)
MCV: 94.6 fL (ref 78.0–100.0)
Monocytes Absolute: 0.6 10*3/uL (ref 0.1–1.0)
Monocytes Relative: 9 % (ref 3–12)
Neutro Abs: 4.1 10*3/uL (ref 1.7–7.7)
Platelets: 245 10*3/uL (ref 150–400)
RDW: 13.3 % (ref 11.5–15.5)
WBC: 7.1 10*3/uL (ref 4.0–10.5)

## 2013-02-13 LAB — URINALYSIS, ROUTINE W REFLEX MICROSCOPIC
Bilirubin Urine: NEGATIVE
Glucose, UA: NEGATIVE mg/dL
Ketones, ur: NEGATIVE mg/dL
Protein, ur: NEGATIVE mg/dL
pH: 6.5 (ref 5.0–8.0)

## 2013-02-13 LAB — LITHIUM LEVEL: Lithium Lvl: 0.29 mEq/L — ABNORMAL LOW (ref 0.80–1.40)

## 2013-02-13 MED ORDER — LITHIUM CARBONATE 300 MG PO CAPS
300.0000 mg | ORAL_CAPSULE | Freq: Every day | ORAL | Status: DC
  Administered 2013-02-14 – 2013-02-16 (×3): 300 mg via ORAL
  Filled 2013-02-13 (×3): qty 1

## 2013-02-13 MED ORDER — ZOLPIDEM TARTRATE 5 MG PO TABS
5.0000 mg | ORAL_TABLET | Freq: Every evening | ORAL | Status: DC | PRN
  Administered 2013-02-13 – 2013-02-16 (×4): 5 mg via ORAL
  Filled 2013-02-13 (×4): qty 1

## 2013-02-13 MED ORDER — CLONAZEPAM 1 MG PO TABS
1.0000 mg | ORAL_TABLET | Freq: Two times a day (BID) | ORAL | Status: DC | PRN
  Administered 2013-02-13 – 2013-02-19 (×7): 1 mg via ORAL
  Filled 2013-02-13 (×7): qty 1

## 2013-02-13 MED ORDER — ONDANSETRON HCL 4 MG PO TABS: 4.0000 mg | ORAL_TABLET | Freq: Three times a day (TID) | ORAL | Status: DC | PRN

## 2013-02-13 MED ORDER — IBUPROFEN 200 MG PO TABS
600.0000 mg | ORAL_TABLET | Freq: Three times a day (TID) | ORAL | Status: DC | PRN
  Filled 2013-02-13: qty 3

## 2013-02-13 MED ORDER — BENZTROPINE MESYLATE 1 MG PO TABS
2.0000 mg | ORAL_TABLET | Freq: Two times a day (BID) | ORAL | Status: DC
  Administered 2013-02-13 – 2013-02-19 (×11): 2 mg via ORAL
  Filled 2013-02-13 (×12): qty 2

## 2013-02-13 MED ORDER — LITHIUM CARBONATE 300 MG PO CAPS: 300.0000 mg | ORAL_CAPSULE | Freq: Every day | ORAL | Status: DC

## 2013-02-13 MED ORDER — IBUPROFEN 800 MG PO TABS
800.0000 mg | ORAL_TABLET | Freq: Three times a day (TID) | ORAL | Status: DC
  Administered 2013-02-13 – 2013-02-19 (×11): 800 mg via ORAL
  Filled 2013-02-13 (×11): qty 1

## 2013-02-13 MED ORDER — PANTOPRAZOLE SODIUM 40 MG PO TBEC
40.0000 mg | DELAYED_RELEASE_TABLET | Freq: Every day | ORAL | Status: DC
  Administered 2013-02-13 – 2013-02-19 (×7): 40 mg via ORAL
  Filled 2013-02-13 (×7): qty 1

## 2013-02-13 MED ORDER — LEVOTHYROXINE SODIUM 25 MCG PO TABS
25.0000 ug | ORAL_TABLET | Freq: Every day | ORAL | Status: DC
  Administered 2013-02-14 – 2013-02-19 (×6): 25 ug via ORAL
  Filled 2013-02-13 (×7): qty 1

## 2013-02-13 MED ORDER — DIVALPROEX SODIUM 500 MG PO DR TAB
1000.0000 mg | DELAYED_RELEASE_TABLET | Freq: Two times a day (BID) | ORAL | Status: DC
  Administered 2013-02-13 – 2013-02-19 (×11): 1000 mg via ORAL
  Filled 2013-02-13 (×12): qty 2

## 2013-02-13 MED ORDER — LORAZEPAM 1 MG PO TABS
1.0000 mg | ORAL_TABLET | Freq: Three times a day (TID) | ORAL | Status: DC | PRN
  Administered 2013-02-13 – 2013-02-16 (×5): 1 mg via ORAL
  Filled 2013-02-13 (×5): qty 1

## 2013-02-13 NOTE — Consult Note (Deleted)
  TTS assessment reveals pt sexually inappropriate with threats toward females and c/o command voices telling him to engage in inappropriate sexual activity.This pt has ben in and out of ER 17 times this month for variouis problems with family group homes.? Needs placement in psychiatric group home.He is DNA to [facility] at this time.Recommend he be placed at appropriate facilitty-possibly long term? For further treatment.

## 2013-02-13 NOTE — ED Notes (Signed)
Pt up to nursing station talking non-sensical and inappropriately to staff. Asked to go back to his room and allow medications that have been given to work, initially, he wouldn't go but he did stating that he had to get away from [facility] before he goes kuckoo for cocoa puffs.

## 2013-02-13 NOTE — ED Notes (Signed)
Belongings placed in locker 39 

## 2013-02-13 NOTE — BH Assessment (Addendum)
Pt has been declined at [facility]. The following facilities were contacted for placement:  [facility]: Per Dwan Bolt, RN, Pt is too acute for their facility [facility]: Per Thea Silversmith, no high acuity beds available [facility]: Cannot accept adult [facility]: Per Vassie Loll, no appropriate bed is available [facility]: Per Nedra Hai, at capacity [employer] [facility]: [facility] is out of our network for [employer]" [facility]: Cannot accept adult [facility]: No response. Left voicemail requesting bed availability.  [facility]: Bed available. Faxed clinical information [facility]: Bed available. Faxed clinical information [facility]: Bed available. Faxed clinical information [facility]: Bed available. Faxed clinical information   Harlin Rain Ria Comment, [facility] Triage Specialist

## 2013-02-13 NOTE — BH Assessment (Signed)
Consulted with Arthor Captain, PA-C regarding Pt. Tele-assessment scheduled for 2000.  Harlin Rain Ria Comment, [facility] Triage Specialist

## 2013-02-13 NOTE — ED Notes (Signed)
Report to [facility] to psyche ED
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
## 2013-02-13 NOTE — ED Notes (Signed)
Tried to call EDP for Geodon order since HS meds haven't touched pt or help him calm down but phone is rolling over. Pt currently up at nurses station asking to go home, explained to him that they want him to go inpatient for treatment and he asked why and what facility. Explained to him that he'll go to a facility for a few days or so so they can get his medications right and better control of voices. Asked pt if he needed shot and if he'd let writer give a shot and he ignored the question.

## 2013-02-13 NOTE — ED Notes (Signed)
Per pt, recently discharged form [facility]-states he is not feeling like himself
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
## 2013-02-14 ENCOUNTER — Encounter (HOSPITAL_COMMUNITY): Payer: Self-pay | Admitting: Psychiatry

## 2013-02-14 DIAGNOSIS — F29 Unspecified psychosis not due to a substance or known physiological condition: Secondary | ICD-10-CM

## 2013-02-14 DIAGNOSIS — F259 Schizoaffective disorder, unspecified: Secondary | ICD-10-CM

## 2013-02-14 MED ORDER — OLANZAPINE 10 MG PO TBDP
10.0000 mg | ORAL_TABLET | Freq: Every day | ORAL | Status: DC
  Administered 2013-02-14 – 2013-02-16 (×3): 10 mg via ORAL
  Filled 2013-02-14 (×2): qty 1
  Filled 2013-02-14: qty 2

## 2013-02-14 MED ORDER — ZIPRASIDONE MESYLATE 20 MG IM SOLR
20.0000 mg | Freq: Once | INTRAMUSCULAR | Status: AC
  Administered 2013-02-14: 20 mg via INTRAMUSCULAR
  Filled 2013-02-14: qty 20

## 2013-02-14 MED ORDER — LORAZEPAM 2 MG/ML IJ SOLN
2.0000 mg | Freq: Once | INTRAMUSCULAR | Status: AC
  Administered 2013-02-14: 2 mg via INTRAMUSCULAR
  Filled 2013-02-14: qty 1

## 2013-02-14 MED ORDER — HYDROXYZINE HCL 25 MG PO TABS
50.0000 mg | ORAL_TABLET | Freq: Once | ORAL | Status: AC
  Administered 2013-02-14: 50 mg via ORAL
  Filled 2013-02-14: qty 2

## 2013-02-14 MED ORDER — HYDROXYZINE HCL 50 MG/ML IM SOLN: 50.0000 mg | Freq: Once | INTRAMUSCULAR | Status: DC
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
## 2013-02-14 NOTE — ED Provider Notes (Signed)
Medical screening examination/treatment/procedure(s) were performed by non-physician practitioner and as supervising physician I was immediately available for consultation/collaboration.  Toy Baker, MD 02/14/13 [phone]
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
## 2013-02-14 NOTE — ED Notes (Signed)
Per pharmacist Amy, vistaril and ativan can be mixed in same syringe for administration.

## 2013-02-14 NOTE — Progress Notes (Signed)
Pt has had 17 or so visits to ER.  Psychiatry is recommending pt to [facility]  at this time.  SW may be involved in some capacity due to some of pt needs.

## 2013-02-14 NOTE — ED Notes (Signed)
Pt up at nursing station fussing about he doesn't need to be here in this damn hospital and we need to call his [facility] and have his uncle come and get him. Explained to pt he and writer talked last night about him needing to go inpatient to a facility to get his medications regulated and get the [facility] and [facility] under control. He is now under IVC so let him know he is and he can't leave. He said they are lying on him and rip the papers up and let him go. He walked back to his room for awhile then came back to nursing station paced a little bit then asked for some food. He seems to have calmed himself down. He did say "damn she thick as hell" in reference to other RN working and Clinical research associate told him that we need to keep our comments appropriate, please. He made some reference to something happening but Clinical research associate didn't catch everything he said but told him nothing is going to happen.
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
## 2013-02-14 NOTE — ED Notes (Signed)
Per [name] see if pt will take Vistaril 50 mg by mouth. If not just give other two IM's.

## 2013-02-14 NOTE — Progress Notes (Addendum)
Spoke with Willa Rough at [facility] who confirmed that application, referral, and IVC paper work had all been received, under review with RN will call back with update.  Update: Call received at 1431 from [facility], as of 10.25.2014 pt is currently on [facility] wait list.  Tomi Bamberger, MHT
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
## 2013-02-14 NOTE — Progress Notes (Addendum)
Call made to [facility], spoke with Corrie Dandy, authorization number and number of days given for pt; Auth# [id number] from 10.25-10.31  [facility], MHT

## 2013-02-14 NOTE — BHH Counselor (Signed)
Assisted with IVC paper work with Dr. Alta Corning and notary.  Consulted with Byrd Hesselbach at [facility] who will follow up with [facility].  She needs IVC.  [employer] has not served pt yet with paper work but when they do nurses know to fax to [place] at [phone].  Then Byrd Hesselbach can work on paper work and placement.  Meanwhile SW will also follow up with their leads as well.

## 2013-02-15 ENCOUNTER — Encounter (HOSPITAL_COMMUNITY): Payer: Self-pay | Admitting: Registered Nurse

## 2013-02-15 MED ORDER — ZIPRASIDONE MESYLATE 20 MG IM SOLR: 20.0000 mg | Freq: Once | INTRAMUSCULAR | Status: AC

## 2013-02-15 MED ORDER — ZIPRASIDONE MESYLATE 20 MG IM SOLR
INTRAMUSCULAR | Status: AC
  Administered 2013-02-15: 20 mg via INTRAMUSCULAR
  Filled 2013-02-15: qty 20

## 2013-02-15 NOTE — Progress Notes (Signed)
Pt still confirmed on [facility] wait list per [facility] 10.26.14 @ 1020am.  Tomi Bamberger, MHT

## 2013-02-15 NOTE — ED Notes (Addendum)
Has been back and forth to nurses station this morning talking to mental health tech.(flirting, inappropriate conversation). Increasingly more difficult to redirect back to his room. Appeared that loud conversation by another patient who speaks a different language was irritating him. Security called to assist.Spoke with Shuvon NP and was decided to move him to room 37. Continued to come out in the hall not wanting to cooperate. His behavior and mannerisms beginning to escalate. Geodon 20mg  IM ordered as onetime dose. Injection given without incident.

## 2013-02-15 NOTE — Progress Notes (Signed)
CSW submitted pt for PASRR screen. MUST ID: [id number].  York Spaniel Lantry, [phone]     ED CSW

## 2013-02-15 NOTE — ED Notes (Signed)
Pt approaching desk multiple times during shift report, asking staff several silly questions such as "What's your favorite color? Do you eat spaghetti?" Pt with childlike interactions. No s/s of distress noted at this time. Pt redirected several times.

## 2013-02-15 NOTE — Consult Note (Signed)
[facility] Face-to-Face Psychiatry Consult   Reason for Consult:  Assault ive,psychotic behaviors Referring Physician:  ER MD [name] is an 27 y.o. male.  Assessment: AXIS I:  Psychotic Disorder NOS and Schizoaffective Disorder AXIS II:  Deferred AXIS III:   Past Medical History  Diagnosis Date  . Bipolar 1 disorder   . Schizophrenia, acute   . Seizures   . Acid reflux   . Anxiety    AXIS IV:  housing problems, other psychosocial or environmental problems, problems related to legal system/crime, problems related to social environment and problems with primary support group AXIS V:  41-50 serious symptoms  Plan:  Recommend psychiatric Inpatient admission when medically cleared.  Subjective:   [name] is a 27 y.o. male patient admitted with psychosis, violent behaviors--recommend [facility].  Patient states "I was protecting the people in the group home.  I was the only young person there everyone else was old.  I said if any body comes in this house it's going to be trouble.  I can't sleep they give me medicine that last 35-40 minutes and then it wears off."   Continues to hear voices telling him to rape women. Patient is intrusive with male tech's. Patient refuses to stay in his room, easily agitated and states that he is not going to follow any rules.  Patient states that he keeps hearing weird noises.   Patient begins to get up set with Dr. Channing Mutters so interview is stopped to prevent increased agitation and disruptive behavior.     HPI Elements:   Location:  generalized. Quality:  acute. Severity:  severe. Timing:  constant. Duration:  past month. Context:  psychosis, voices.  Past Psychiatric History: Past Medical History  Diagnosis Date  . Bipolar 1 disorder   . Schizophrenia, acute   . Seizures   . Acid reflux   . Anxiety     reports that he has been smoking.  He does not have any smokeless tobacco history on file. He reports that he drinks alcohol. He reports  that he does not use illicit drugs. History reviewed. No pertinent family history. Family History Substance Abuse: No Family Supports: Yes, List: (Mother, who lives in [place]) Living Arrangements: Other (Comment) ([facility] group home) Can pt return to current living arrangement?: No   Allergies:   Allergies  Allergen Reactions  . Benadryl [Diphenhydramine Hcl] Other (See Comments)    ACT Assessment Complete:  Yes:    Educational Status    Risk to Self: Risk to self Suicidal Ideation: No Suicidal Intent: No Is patient at risk for suicide?: No Suicidal Plan?: No Specify Current Suicidal Plan: None Access to Means: No What has been your use of drugs/alcohol within the last 12 months?: NA Previous Attempts/Gestures: Yes How many times?: 4 Other Self Harm Risks: None Triggers for Past Attempts: None known Intentional Self Injurious Behavior: None Family Suicide History: Unknown Recent stressful life event(s): Other (Comment) (Transfer to new group home, mother has cancer) Persecutory voices/beliefs?: No Depression: Yes Depression Symptoms: Feeling angry/irritable;Tearfulness;Despondent Substance abuse history and/or treatment for substance abuse?: No (UDS negative,   BAL<11) Suicide prevention information given to non-admitted patients: Not applicable  Risk to Others: Risk to Others Homicidal Ideation: No Thoughts of Harm to Others: Yes-Currently Present Comment - Thoughts of Harm to Others: Hearing voices telling him to rape women Current Homicidal Intent: No Current Homicidal Plan: No Describe Current Homicidal Plan: None Access to Homicidal Means: No Describe Access to Homicidal Means:  None Identified Victim: None History of harm to others?: Yes Assessment of Violence: In past 6-12 months Violent Behavior Description: History of aggressive behavior with police Does patient have access to weapons?: No Criminal Charges Pending?: No Does patient have a court  date: No  Abuse:    Prior Inpatient Therapy: Prior Inpatient Therapy Prior Inpatient Therapy: Yes Prior Therapy Dates: Pt cannot recall Prior Therapy Facilty/Provider(s): Pt cannot recall Reason for Treatment: Unknown  Prior Outpatient Therapy: Prior Outpatient Therapy Prior Outpatient Therapy: Yes Prior Therapy Dates: Over one year Prior Therapy Facilty/Provider(s): [facility] Reason for Treatment: med management  Additional Information: Additional Information 1:1 In Past 12 Months?: No CIRT Risk: No Elopement Risk: No Does patient have medical clearance?: Yes                  Objective: Blood pressure 125/75, pulse 60, temperature 97.7 F (36.5 C), temperature source Oral, resp. rate 20, SpO2 98.00%.There is no weight on file to calculate BMI. Results for orders placed during the hospital encounter of 02/13/13 (from the past 72 hour(s))  VALPROIC ACID LEVEL     Status: None   Collection Time    02/13/13  5:20 PM      Result Value Range   Valproic Acid Lvl 62.6  50.0 - 100.0 ug/mL   Comment: Performed at [facility]  LITHIUM LEVEL     Status: Abnormal   Collection Time    02/13/13  5:20 PM      Result Value Range   Lithium Lvl 0.29 (*) 0.80 - 1.40 mEq/L  CBC WITH DIFFERENTIAL     Status: Abnormal   Collection Time    02/13/13  5:20 PM      Result Value Range   WBC 7.1  4.0 - 10.5 K/uL   RBC 4.10 (*) 4.22 - 5.81 MIL/uL   Hemoglobin 12.2 (*) 13.0 - 17.0 g/dL   HCT 16.1 (*) 09.6 - 04.5 %   MCV 94.6  78.0 - 100.0 fL   MCH 29.8  26.0 - 34.0 pg   MCHC 31.4  30.0 - 36.0 g/dL   RDW 40.9  81.1 - 91.4 %   Platelets 245  150 - 400 K/uL   Neutrophils Relative % 58  43 - 77 %   Neutro Abs 4.1  1.7 - 7.7 K/uL   Lymphocytes Relative 31  12 - 46 %   Lymphs Abs 2.2  0.7 - 4.0 K/uL   Monocytes Relative 9  3 - 12 %   Monocytes Absolute 0.6  0.1 - 1.0 K/uL   Eosinophils Relative 1  0 - 5 %   Eosinophils Absolute 0.1  0.0 - 0.7 K/uL   Basophils Relative 0  0 - 1  %   Basophils Absolute 0.0  0.0 - 0.1 K/uL  BASIC METABOLIC PANEL     Status: Abnormal   Collection Time    02/13/13  5:20 PM      Result Value Range   Sodium 141  135 - 145 mEq/L   Potassium 4.1  3.5 - 5.1 mEq/L   Chloride 105  96 - 112 mEq/L   CO2 26  19 - 32 mEq/L   Glucose, Bld 112 (*) 70 - 99 mg/dL   BUN 11  6 - 23 mg/dL   Creatinine, Ser 7.82  0.50 - 1.35 mg/dL   Calcium 9.6  8.4 - 95.6 mg/dL   GFR calc non Af Amer >90  >90 mL/min   GFR calc  Af Amer >90  >90 mL/min   Comment: (NOTE)     The eGFR has been calculated using the CKD EPI equation.     This calculation has not been validated in all clinical situations.     eGFR's persistently <90 mL/min signify possible Chronic Kidney     Disease.  URINALYSIS, ROUTINE W REFLEX MICROSCOPIC     Status: None   Collection Time    02/13/13  5:40 PM      Result Value Range   Color, Urine YELLOW  YELLOW   APPearance CLEAR  CLEAR   Specific Gravity, Urine 1.026  1.005 - 1.030   pH 6.5  5.0 - 8.0   Glucose, UA NEGATIVE  NEGATIVE mg/dL   Hgb urine dipstick NEGATIVE  NEGATIVE   Bilirubin Urine NEGATIVE  NEGATIVE   Ketones, ur NEGATIVE  NEGATIVE mg/dL   Protein, ur NEGATIVE  NEGATIVE mg/dL   Urobilinogen, UA 1.0  0.0 - 1.0 mg/dL   Nitrite NEGATIVE  NEGATIVE   Leukocytes, UA NEGATIVE  NEGATIVE   Comment: MICROSCOPIC NOT DONE ON URINES WITH NEGATIVE PROTEIN, BLOOD, LEUKOCYTES, NITRITE, OR GLUCOSE <1000 mg/dL.  URINE RAPID DRUG SCREEN (HOSP PERFORMED)     Status: None   Collection Time    02/13/13  5:40 PM      Result Value Range   Opiates NONE DETECTED  NONE DETECTED   Cocaine NONE DETECTED  NONE DETECTED   Benzodiazepines NONE DETECTED  NONE DETECTED   Amphetamines NONE DETECTED  NONE DETECTED   Tetrahydrocannabinol NONE DETECTED  NONE DETECTED   Barbiturates NONE DETECTED  NONE DETECTED   Comment:            DRUG SCREEN FOR MEDICAL PURPOSES     ONLY.  IF CONFIRMATION IS NEEDED     FOR ANY PURPOSE, NOTIFY LAB     WITHIN 5 DAYS.                 LOWEST DETECTABLE LIMITS     FOR URINE DRUG SCREEN     Drug Class       Cutoff (ng/mL)     Amphetamine      1000     Barbiturate      200     Benzodiazepine   200     Tricyclics       300     Opiates          300     Cocaine          300     THC              50   Labs are reviewed and are pertinent for no medical issues.  Current Facility-Administered Medications  Medication Dose Route Frequency Provider Last Rate Last Dose  . benztropine (COGENTIN) tablet 2 mg  2 mg Oral BID Arthor Captain, PA-C   2 mg at 02/15/13 0931  . clonazePAM (KLONOPIN) tablet 1 mg  1 mg Oral BID PRN Arthor Captain, PA-C   1 mg at 02/14/13 2250  . divalproex (DEPAKOTE) DR tablet 1,000 mg  1,000 mg Oral BID Arthor Captain, PA-C   1,000 mg at 02/15/13 0932  . ibuprofen (ADVIL,MOTRIN) tablet 600 mg  600 mg Oral Q8H PRN Arthor Captain, PA-C      . ibuprofen (ADVIL,MOTRIN) tablet 800 mg  800 mg Oral TID Arthor Captain, PA-C   800 mg at 02/15/13 0931  . levothyroxine (SYNTHROID, LEVOTHROID) tablet 25 mcg  25  mcg Oral QAC breakfast Arthor Captain, PA-C   25 mcg at 02/15/13 4782  . lithium carbonate capsule 300 mg  300 mg Oral Daily Toy Baker, MD   300 mg at 02/15/13 0932  . LORazepam (ATIVAN) tablet 1 mg  1 mg Oral Q8H PRN Arthor Captain, PA-C   1 mg at 02/15/13 0932  . OLANZapine zydis (ZYPREXA) disintegrating tablet 10 mg  10 mg Oral Daily Nanine Means, NP   10 mg at 02/15/13 0932  . ondansetron (ZOFRAN) tablet 4 mg  4 mg Oral Q8H PRN Arthor Captain, PA-C      . pantoprazole (PROTONIX) EC tablet 40 mg  40 mg Oral Daily Toy Baker, MD   40 mg at 02/15/13 0931  . ziprasidone (GEODON) injection 20 mg  20 mg Intramuscular Once Shuvon Rankin, NP      . zolpidem (AMBIEN) tablet 5 mg  5 mg Oral QHS PRN Arthor Captain, PA-C   5 mg at 02/14/13 2250   Current Outpatient Prescriptions  Medication Sig Dispense Refill  . benztropine (COGENTIN) 2 MG tablet Take 2 mg by mouth 2 (two) times daily.       . clonazePAM (KLONOPIN) 1 MG tablet Take 1 mg by mouth 2 (two) times daily as needed for anxiety.      . divalproex (DEPAKOTE) 500 MG DR tablet Take 1,000 mg by mouth 2 (two) times daily.      [name] ibuprofen (ADVIL,MOTRIN) 800 MG tablet Take 1 tablet (800 mg total) by mouth 3 (three) times daily.  21 tablet  0  . levothyroxine (SYNTHROID, LEVOTHROID) 25 MCG tablet Take 25 mcg by mouth daily before breakfast.      . lithium carbonate 300 MG capsule Take 300 mg by mouth daily.      [name] omeprazole (PRILOSEC) 20 MG capsule Take 20 mg by mouth daily.        Psychiatric Specialty Exam:     Blood pressure 125/75, pulse 60, temperature 97.7 F (36.5 C), temperature source Oral, resp. rate 20, SpO2 98.00%.There is no weight on file to calculate BMI.  General Appearance: Casual  Eye Contact::  Minimal  Speech:  Slow  Volume:  Decreased  Mood:  Irritable  Affect:  Flat  Thought Process:  Irrelevant  Orientation:  Full (Time, Place, and Person)  Thought Content:  Hallucinations: Auditory and command  Suicidal Thoughts:  No  Homicidal Thoughts:  Yes.  with intent/plan  Memory:  Immediate;   Poor Recent;   Poor Remote;   Poor  Judgement:  Poor  Insight:  Lacking  Psychomotor Activity:  Decreased  Concentration:  Poor  Recall:  Poor  Akathisia:  No  Handed:  Right  AIMS (if indicated):     Assets:  Resilience  Sleep:      Face to face interview and consult with Dr. Gilmore Laroche  Treatment Plan Summary: Daily contact with patient to assess and evaluate symptoms and progress in treatment Medication management  Disposition:  Continue with current treatment plan for inpatient treatment.  Patient on [facility] wait list.  Continue to monitor for safety and stabilization while waiting on inpatient bed.  Rankin, Shuvon, FNP-BC  02/15/2013 11:08 AM I agreed with findings and treatment plan of this patient

## 2013-02-15 NOTE — ED Notes (Signed)
Resting quietly at present.

## 2013-02-16 DIAGNOSIS — F060 Psychotic disorder with hallucinations due to known physiological condition: Secondary | ICD-10-CM

## 2013-02-16 MED ORDER — LORAZEPAM 1 MG PO TABS
1.0000 mg | ORAL_TABLET | ORAL | Status: DC | PRN
  Administered 2013-02-16 – 2013-02-18 (×4): 1 mg via ORAL
  Filled 2013-02-16 (×5): qty 1

## 2013-02-16 MED ORDER — OLANZAPINE 10 MG PO TBDP
20.0000 mg | ORAL_TABLET | Freq: Every day | ORAL | Status: DC
  Administered 2013-02-17 – 2013-02-19 (×3): 20 mg via ORAL
  Filled 2013-02-16 (×3): qty 2

## 2013-02-16 MED ORDER — HALOPERIDOL DECANOATE 100 MG/ML IM SOLN
50.0000 mg | Freq: Once | INTRAMUSCULAR | Status: AC
  Administered 2013-02-16: 50 mg via INTRAMUSCULAR
  Filled 2013-02-16: qty 0.5

## 2013-02-16 MED ORDER — HALOPERIDOL LACTATE 5 MG/ML IJ SOLN
5.0000 mg | Freq: Two times a day (BID) | INTRAMUSCULAR | Status: DC | PRN
  Administered 2013-02-16 – 2013-02-19 (×4): 5 mg via INTRAMUSCULAR
  Filled 2013-02-16 (×4): qty 1

## 2013-02-16 MED ORDER — LITHIUM CARBONATE 300 MG PO CAPS
300.0000 mg | ORAL_CAPSULE | Freq: Two times a day (BID) | ORAL | Status: DC
  Administered 2013-02-16 – 2013-02-18 (×4): 300 mg via ORAL
  Filled 2013-02-16 (×4): qty 1

## 2013-02-16 NOTE — ED Notes (Signed)
Patient given ham sandwich and two pieces of cheese

## 2013-02-16 NOTE — Progress Notes (Signed)
Per discussion with psychiatrist, and chart review patient not psychiatrically stable for placement at this time and is recommending for inpatient, at [facility]. CSW contacted pasarr, who recommended canceling [facility]arr request at this time and to resubmit once psychiatrically stable.   Catha Gosselin, LCSW [phone]  ED CSW .02/16/2013 1055am

## 2013-02-16 NOTE — Progress Notes (Signed)
Received phone call from [place] at [facility] who asked if pt. Was still admitted at [facility]. Junious Dresser stated that she will inform TTS when bed is available. -Rodman Pickle, MHT

## 2013-02-16 NOTE — Consult Note (Signed)
Patient upset on assessment, "I really want to go home today. I'm leaving the hospital."  He states he is taking all of his medications but does not want an injection.  After talking to him and discussing the possibility of not being able to return to his group home, he became angry with cursing and yelling.  Lithium 300 mg increased to BID and haldol deconate 50 mg ordered.  He did calm down and became pleasant and cooperative.  Dr. Lolly Mustache assessed this patient and agrees with the treatment plan.  I have personally seen the patient and agreed with the findings and involved in the treatment plan. Kathryne Sharper, MD

## 2013-02-16 NOTE — Progress Notes (Signed)
Per Okey Regal, pt remains on [facility] waitlist.   .Catha Gosselin, LCSW [phone]  ED CSW  [phone]

## 2013-02-17 DIAGNOSIS — F919 Conduct disorder, unspecified: Secondary | ICD-10-CM

## 2013-02-17 MED ORDER — LORAZEPAM 2 MG/ML IJ SOLN
2.0000 mg | Freq: Once | INTRAMUSCULAR | Status: AC
  Administered 2013-02-19: 2 mg via INTRAMUSCULAR
  Filled 2013-02-17: qty 1

## 2013-02-17 NOTE — ED Notes (Signed)
Pt. Able to calm down with therapeutic discussion with psych ED staff.  IM medication orders not needed at this time, will continue to evaluate pt. And give Ativan if needed.

## 2013-02-17 NOTE — ED Notes (Signed)
Pt. Continues to agitated other pt.'s, pacing in hallways, speaking in a very loud voice, wanting to argue with staff after they answer his questions.

## 2013-02-17 NOTE — ED Notes (Signed)
Called [employer] [employer] to assist with getting pt. Out of unit's hallway and back to his room, pt. Continues to get other pt.'s upset with his loud words in hallway and agitate other pt.'s, escalating their behavior.

## 2013-02-17 NOTE — ED Notes (Signed)
Pt. Awake, ambulated to bathrm. Without any difficulty.

## 2013-02-17 NOTE — ED Notes (Signed)
Pt. In hallway, loudly stating "I want ta get outta here, if I can't I'll start to mess things up".  Pt. Given sandwich/drink, asked to return to his room.

## 2013-02-17 NOTE — ED Notes (Signed)
Pt. Attempting to call his group home, pt. Given x2 phone numbers out of his chart that are his contact phone numbers, one didn't work and pt. Spoke with someone with 2nd phone number.  Pt. Continues to get very upset that he is "still here", wants to go back to his group home.  Pt. States that the 2nd person he spoke with was his "Mother and group home director".

## 2013-02-17 NOTE — Progress Notes (Signed)
CSW faxed notes to [facility] to document need for prioritization.   Confirmed with Robinette that fax has been received.  Marva Panda, LCSWA  [phone]  .02/17/2013  4:00 pm

## 2013-02-17 NOTE — Consult Note (Addendum)
  Psychiatric Specialty Exam: Physical Exam  ROS  Blood pressure 134/78, pulse 58, temperature 97.8 F (36.6 C), temperature source Oral, resp. rate 18, SpO2 98.00%.There is no weight on file to calculate BMI.  General Appearance: Fairly Groomed  Patent attorney::  Good  Speech:  angry  Volume:  Increased  Mood:  Angry  Affect:  Labile  Thought Process:  defending himself and not listening to [place]  Orientation:  Full (Time, Place, and Person)  Thought Content:  defensive and denying all issues  Suicidal Thoughts:  No  Homicidal Thoughts:  No  Memory:  Immediate;   Poor Recent;   Poor Remote;   Poor  Judgement:  Impaired  Insight:  Lacking  Psychomotor Activity:  Increased  Concentration:  Poor  Recall:  Poor  Akathisia:  Negative  Handed:  Right  AIMS (if indicated):     Assets:  Physical Health  Sleep:   adequate   [name] is very immature.  He denies any problems, denies voices, denies threatening others, denies the police being called, believes the patients at his group home love him and he is their protector, believes a staff member is his girlfriend.  He will not listen to any suggestion that he might not be going back to the home today getting angry and seemingly threatening.  He definitely does not process language well and consistently does things that cause himself problems without any apparent idea that he caused the problem he is denying.  At this point he needs inpatient at [facility]. [name] will go off when told he cannot go home today and will have to be restrained physically or chemically as this has been his behavior all along.

## 2013-02-17 NOTE — ED Notes (Signed)
Called [employer] to assist with getting pt. Back in his room to get IM medicaton.  Pt. In hallway, yelling he's going to F.... Tear this place up if he doesn't get [place] here today.  [facility] contacted for assistance.

## 2013-02-18 MED ORDER — ZIPRASIDONE MESYLATE 20 MG IM SOLR
INTRAMUSCULAR | Status: AC
  Filled 2013-02-18: qty 20

## 2013-02-18 MED ORDER — STERILE WATER FOR INJECTION IJ SOLN
INTRAMUSCULAR | Status: AC
  Filled 2013-02-18: qty 10

## 2013-02-18 MED ORDER — ZIPRASIDONE MESYLATE 20 MG IM SOLR
20.0000 mg | Freq: Once | INTRAMUSCULAR | Status: AC
  Administered 2013-02-18: 20 mg via INTRAMUSCULAR

## 2013-02-18 MED ORDER — LORAZEPAM 2 MG/ML IJ SOLN
2.0000 mg | Freq: Once | INTRAMUSCULAR | Status: AC
  Administered 2013-02-18: 2 mg via INTRAMUSCULAR
  Filled 2013-02-18: qty 1

## 2013-02-18 MED ORDER — HALOPERIDOL DECANOATE 100 MG/ML IM SOLN: 100.0000 mg | INTRAMUSCULAR | Status: DC

## 2013-02-18 MED ORDER — LITHIUM CARBONATE 300 MG PO CAPS
600.0000 mg | ORAL_CAPSULE | Freq: Every day | ORAL | Status: DC
  Administered 2013-02-18: 600 mg via ORAL
  Filled 2013-02-18 (×2): qty 2

## 2013-02-18 MED ORDER — LITHIUM CARBONATE 300 MG PO CAPS
300.0000 mg | ORAL_CAPSULE | Freq: Every morning | ORAL | Status: DC
  Administered 2013-02-19: 300 mg via ORAL
  Filled 2013-02-18: qty 1

## 2013-02-18 MED ORDER — ZIPRASIDONE MESYLATE 20 MG IM SOLR
20.0000 mg | Freq: Once | INTRAMUSCULAR | Status: AC
  Administered 2013-02-18: 20 mg via INTRAMUSCULAR
  Filled 2013-02-18: qty 20

## 2013-02-18 NOTE — Progress Notes (Signed)
Writer confirmed that the patient is on the [facility] wait list per, Joy.

## 2013-02-18 NOTE — Consult Note (Signed)
Subjective: Patient states "I feel 100 % better than when I first got here.  I was hearing all of those crazy voices.  That medicine you gave me helped." Patient continues to deny that he got into trouble at the group home and states that he has talked to the owner and he can come back.  Patient states that he slept well.  At this time patient denies suicidal ideation, homicidal ideation, psychosis, and paranoia. However the patient appears restless, easily agitated and walking up and down hall talking out loud and laughing.    Psychiatric Specialty Exam: Physical Exam  ROS  Blood pressure 123/85, pulse 88, temperature 97.6 F (36.4 C), temperature source Oral, resp. rate 16, SpO2 100.00%.There is no weight on file to calculate BMI.  General Appearance: Fairly Groomed  Patent attorney::  Good  Speech:  angry  Volume:  Increased  Mood:  Angry  Affect:  Labile  Thought Process:  defending himself and not listening to [place]  Orientation:  Full (Time, Place, and Person)  Thought Content:  Hallucinations: Auditory and patient is defensive and denying all issues  Suicidal Thoughts:  No  Homicidal Thoughts:  No  Memory:  Immediate;   Poor Recent;   Poor Remote;   Poor  Judgement:  Impaired  Insight:  Lacking  Psychomotor Activity:  Increased  Concentration:  Poor  Recall:  Poor  Akathisia:  Negative  Handed:  Right  AIMS (if indicated):     Assets:  Physical Health  Sleep:   adequate   Face to face interview and consult with Dr. Lolly Mustache  Disposition:  Will continue with current treatment plan and increase Lithium to 300 mg Q AM and 600 mg Q HS.  Recheck Lithium level in 3 days.  Patient is to remain on the [facility] wait list.  Continue to monitor for safety and stabilization.  Patient was given Haldol Decanoate 100 mg on 02/16/2013 next dose due 03/19/2013.  Shuvon B. Rankin FNP-BC Family Nurse Practitioner, Board Certified  I have personally seen the patient and agreed with the findings and  involved in the treatment plan. Kathryne Sharper, MD

## 2013-02-18 NOTE — Progress Notes (Signed)
CSW spoke with Okey Regal at [facility].  Pt is currently remains on wait list but does not meet criteria for prioritization at this time.  Marva Panda, Theresia Majors  [phone]  .02/18/2013

## 2013-02-18 NOTE — Progress Notes (Signed)
CSW refaxed information, to try to place patient on priority [facility] list. Per Okey Regal this was not found however evening CSW had confirmed with Robinette had received last night.   Catha Gosselin, LCSW [phone]  ED CSW .02/18/2013 930am

## 2013-02-18 NOTE — Progress Notes (Signed)
Contacted Nurse Amor in admissions at [facility], who confirmed that pt is on the waitlist. - Rodman Pickle, MHT

## 2013-02-18 NOTE — ED Notes (Signed)
Call to phlebotomy for lithium draw. Spoke to Uncertain.  Reported to RN.

## 2013-02-18 NOTE — ED Notes (Signed)
Patient had got some geodon for the way he was acting out cursing,rapping songs,in and out of bathrooms.unable to obtain vitals at this time

## 2013-02-19 DIAGNOSIS — IMO0002 Reserved for concepts with insufficient information to code with codable children: Secondary | ICD-10-CM

## 2013-02-19 DIAGNOSIS — F603 Borderline personality disorder: Secondary | ICD-10-CM

## 2013-02-19 NOTE — Progress Notes (Signed)
CSW faxed updated vitals, ivc papers, and meds to [facility]. CSW spoke with Okey Regal who confirmed pt can be transferred to [facility]. CSW spoke with Sgt. Paschal with [facility], who stated that transportation can be provided today and will be sometime before 7pm. Sgt. Paschal stated he will call the [facility] ED at [phone] when transportation is on the way.   Catha Gosselin, [place] [phone]  ED CSW 02/19/2013 1124am

## 2013-02-19 NOTE — Progress Notes (Deleted)
MHT will fax a list of meds and his vitals to [facility].

## 2013-02-19 NOTE — Progress Notes (Addendum)
Writer informed the nurse working with the patient that [facility] has requested his labs and vitals pending his placement.

## 2013-02-19 NOTE — Progress Notes (Signed)
Received phone call from [place] at [facility] ( [phone]), who stated that pt. Has been accepted pending list of medications for the past two days and current vital signs. Documents have been faxed. Rodman Pickle, MHT

## 2013-02-19 NOTE — Consult Note (Signed)
Note reviewed and agreed with  
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
## 2013-02-19 NOTE — Progress Notes (Signed)
[facility]: 1402 received voice mail from [place] stating they are good to go in receiving pt at [facility].  Aarya Quebedeaux Cathey, MHT

## 2013-02-19 NOTE — Progress Notes (Signed)
Pt accepted by [facility] to [facility]. Pt to be transported by [facility] transport and will be arriving in 15 minutes. CSW informed EDP and RN.   Marland KitchenCatha Gosselin, [place] [phone]  ED CSW .02/19/2013 1337pm

## 2014-03-24 ENCOUNTER — Observation Stay (HOSPITAL_COMMUNITY)
Admission: EM | Admit: 2014-03-24 | Discharge: 2014-03-25 | Disposition: A | Discharge status: Home / Self Care | Payer: Medicaid Other | Attending: Internal Medicine | Admitting: Internal Medicine

## 2014-03-24 ENCOUNTER — Emergency Department (HOSPITAL_COMMUNITY): Payer: Medicaid Other

## 2014-03-24 ENCOUNTER — Encounter (HOSPITAL_COMMUNITY): Payer: Self-pay | Admitting: Emergency Medicine

## 2014-03-24 DIAGNOSIS — Z888 Allergy status to other drugs, medicaments and biological substances status: Secondary | ICD-10-CM | POA: Insufficient documentation

## 2014-03-24 DIAGNOSIS — K920 Hematemesis: Secondary | ICD-10-CM | POA: Insufficient documentation

## 2014-03-24 DIAGNOSIS — K219 Gastro-esophageal reflux disease without esophagitis: Secondary | ICD-10-CM | POA: Diagnosis not present

## 2014-03-24 DIAGNOSIS — R1011 Right upper quadrant pain: Secondary | ICD-10-CM

## 2014-03-24 DIAGNOSIS — F419 Anxiety disorder, unspecified: Secondary | ICD-10-CM | POA: Diagnosis not present

## 2014-03-24 DIAGNOSIS — G40909 Epilepsy, unspecified, not intractable, without status epilepticus: Secondary | ICD-10-CM | POA: Diagnosis not present

## 2014-03-24 DIAGNOSIS — F209 Schizophrenia, unspecified: Secondary | ICD-10-CM | POA: Insufficient documentation

## 2014-03-24 DIAGNOSIS — E860 Dehydration: Secondary | ICD-10-CM | POA: Diagnosis not present

## 2014-03-24 DIAGNOSIS — E876 Hypokalemia: Secondary | ICD-10-CM | POA: Diagnosis not present

## 2014-03-24 DIAGNOSIS — R51 Headache: Secondary | ICD-10-CM | POA: Diagnosis not present

## 2014-03-24 DIAGNOSIS — F250 Schizoaffective disorder, bipolar type: Secondary | ICD-10-CM | POA: Diagnosis present

## 2014-03-24 DIAGNOSIS — F17210 Nicotine dependence, cigarettes, uncomplicated: Secondary | ICD-10-CM | POA: Insufficient documentation

## 2014-03-24 DIAGNOSIS — F319 Bipolar disorder, unspecified: Secondary | ICD-10-CM | POA: Insufficient documentation

## 2014-03-24 DIAGNOSIS — Z79899 Other long term (current) drug therapy: Secondary | ICD-10-CM | POA: Diagnosis not present

## 2014-03-24 DIAGNOSIS — N179 Acute kidney failure, unspecified: Principal | ICD-10-CM | POA: Insufficient documentation

## 2014-03-24 DIAGNOSIS — K824 Cholesterolosis of gallbladder: Secondary | ICD-10-CM | POA: Insufficient documentation

## 2014-03-24 LAB — CBC WITH DIFFERENTIAL/PLATELET
BASOS PCT: 0 % (ref 0–1)
Basophils Absolute: 0 10*3/uL (ref 0.0–0.1)
Eosinophils Absolute: 0 10*3/uL (ref 0.0–0.7)
Eosinophils Relative: 0 % (ref 0–5)
HCT: 36.8 % — ABNORMAL LOW (ref 39.0–52.0)
HEMOGLOBIN: 12.2 g/dL — AB (ref 13.0–17.0)
Lymphocytes Relative: 18 % (ref 12–46)
Lymphs Abs: 1.8 10*3/uL (ref 0.7–4.0)
MCH: 29.2 pg (ref 26.0–34.0)
MCHC: 33.2 g/dL (ref 30.0–36.0)
MCV: 88 fL (ref 78.0–100.0)
Monocytes Absolute: 1.5 10*3/uL — ABNORMAL HIGH (ref 0.1–1.0)
Monocytes Relative: 15 % — ABNORMAL HIGH (ref 3–12)
NEUTROS ABS: 6.6 10*3/uL (ref 1.7–7.7)
NEUTROS PCT: 67 % (ref 43–77)
Platelets: 170 10*3/uL (ref 150–400)
RBC: 4.18 MIL/uL — ABNORMAL LOW (ref 4.22–5.81)
RDW: 12.3 % (ref 11.5–15.5)
WBC: 10 10*3/uL (ref 4.0–10.5)

## 2014-03-24 LAB — URINALYSIS, ROUTINE W REFLEX MICROSCOPIC
Glucose, UA: NEGATIVE mg/dL
Hgb urine dipstick: NEGATIVE
Ketones, ur: NEGATIVE mg/dL
LEUKOCYTES UA: NEGATIVE
NITRITE: NEGATIVE
Protein, ur: 100 mg/dL — AB
Specific Gravity, Urine: 1.022 (ref 1.005–1.030)
UROBILINOGEN UA: 2 mg/dL — AB (ref 0.0–1.0)
pH: 6 (ref 5.0–8.0)

## 2014-03-24 LAB — COMPREHENSIVE METABOLIC PANEL
ALT: 10 U/L (ref 0–53)
ANION GAP: 13 (ref 5–15)
AST: 18 U/L (ref 0–37)
Albumin: 4 g/dL (ref 3.5–5.2)
Alkaline Phosphatase: 59 U/L (ref 39–117)
BILIRUBIN TOTAL: 0.3 mg/dL (ref 0.3–1.2)
BUN: 12 mg/dL (ref 6–23)
CO2: 26 mEq/L (ref 19–32)
Calcium: 9.9 mg/dL (ref 8.4–10.5)
Chloride: 103 mEq/L (ref 96–112)
Creatinine, Ser: 1.62 mg/dL — ABNORMAL HIGH (ref 0.50–1.35)
GFR calc non Af Amer: 57 mL/min — ABNORMAL LOW (ref >90)
GFR, EST AFRICAN AMERICAN: 66 mL/min — AB (ref >90)
GLUCOSE: 118 mg/dL — AB (ref 70–99)
Potassium: 3.3 mEq/L — ABNORMAL LOW (ref 3.7–5.3)
Sodium: 142 mEq/L (ref 137–147)
TOTAL PROTEIN: 7.2 g/dL (ref 6.0–8.3)

## 2014-03-24 LAB — URINE MICROSCOPIC-ADD ON

## 2014-03-24 LAB — LIPASE, BLOOD: Lipase: 38 U/L (ref 11–59)

## 2014-03-24 MED ORDER — SODIUM CHLORIDE 0.9 % IV BOLUS (SEPSIS)
1000.0000 mL | Freq: Once | INTRAVENOUS | Status: AC
  Administered 2014-03-24: 1000 mL via INTRAVENOUS

## 2014-03-24 MED ORDER — GI COCKTAIL ~~LOC~~
30.0000 mL | Freq: Once | ORAL | Status: AC
  Administered 2014-03-24: 30 mL via ORAL
  Filled 2014-03-24: qty 30

## 2014-03-24 MED ORDER — ONDANSETRON HCL 4 MG/2ML IJ SOLN
4.0000 mg | Freq: Once | INTRAMUSCULAR | Status: AC
  Administered 2014-03-24: 4 mg via INTRAVENOUS
  Filled 2014-03-24: qty 2

## 2014-03-24 NOTE — ED Notes (Signed)
Pt reports that he is having lower abdominal pain that started today. Reports vomiting x 12 today. Pt denies nausea currently.   Pt has an arm band which appears to be from a facility but no facility name on band. Pt states "I stay with my girl" when asked where he lives. Pt is difficult to obtain information from. He is watching television during assessment.

## 2014-03-24 NOTE — ED Notes (Signed)
US at bedside

## 2014-03-24 NOTE — ED Notes (Signed)
Pt reports N/V/D x 12 days. Pt reports emesis x 12 within the past 24 hours, diarrhea x 3. Generalized abdominal pain.

## 2014-03-24 NOTE — ED Provider Notes (Signed)
CSN: [id number]     Arrival date & time 03/24/14  1914 History   First MD Initiated Contact with Patient 03/24/14 2027     Chief Complaint  Patient presents with  . Abdominal Pain  . Emesis   [name] is a 28 y.o. male with a history of  Acid reflux, bipolar disorder, schizophrenia, and anxiety who presents to the ED complaining of vomiting blood for the past 11 days. Patient reports he has bright red blood streaking in his vomitus  For the past 10 days. He reports some generalized abdominal pain as well as some nausea, and diarrhea. The patient received 3 episodes of watery stools today. He also reports a generalized headache that he rates it a 10 out of 10. Patient reports is generalized bowel pain is sharp and rates it a 10 out of 10. The patient reports his pain is worse with eating spicy foods. Patient does have a history of acid reflux. No longer taking Prilosec. Patient is a smoker. Patient denies alcohol. Patient reports seeing Tylenol at 6 AM this morning with  Relief of his headache. He is not taking any Tylenol since. The patient has been tolerating liquids and food. The patient has a mountain dew in the room which he states he is drinking. The patient denies fevers, chills, dysuria, hematuria, hematochezia, rashes on his body, sore throat, changes to his vision, cough, wheezing, shortness of breath, or palpitations.   (Consider location/radiation/quality/duration/timing/severity/associated sxs/prior Treatment) HPI  Past Medical History  Diagnosis Date  . Bipolar 1 disorder   . Schizophrenia, acute   . Seizures   . Acid reflux   . Anxiety    Past Surgical History  Procedure Laterality Date  . Abdominal surgery     No family history on file. History  Substance Use Topics  . Smoking status: Current Every Day Smoker -- 0.50 packs/day    Types: Cigarettes  . Smokeless tobacco: Not on file  . Alcohol Use: Yes    Review of Systems  Constitutional: Negative for fever and  chills.  HENT: Negative for congestion, ear discharge, ear pain, mouth sores, sore throat and trouble swallowing.   Eyes: Negative for visual disturbance.  Respiratory: Negative for cough, shortness of breath and wheezing.   Cardiovascular: Negative for chest pain and palpitations.  Gastrointestinal: Positive for nausea, vomiting, abdominal pain and diarrhea. Negative for blood in stool and abdominal distention.  Genitourinary: Negative for dysuria, hematuria, flank pain, decreased urine volume and difficulty urinating.  Musculoskeletal: Negative for back pain and neck pain.  Skin: Negative for rash.  Neurological: Negative for dizziness, weakness, light-headedness and headaches.  All other systems reviewed and are negative.     Allergies  Benadryl  Home Medications   Prior to Admission medications   Medication Sig Start Date End Date Taking? Authorizing Provider  divalproex (DEPAKOTE) 500 MG DR tablet Take 1,000 mg by mouth 2 (two) times daily.   Yes Historical Provider, MD  ibuprofen (ADVIL,MOTRIN) 800 MG tablet Take 1 tablet (800 mg total) by mouth 3 (three) times daily. 12/13/12  Yes Jennifer L Piepenbrink, PA-C  levothyroxine (SYNTHROID, LEVOTHROID) 25 MCG tablet Take 25 mcg by mouth daily before breakfast.    Historical Provider, MD  lithium carbonate 300 MG capsule Take 300 mg by mouth daily.    Historical Provider, MD  omeprazole (PRILOSEC) 20 MG capsule Take 20 mg by mouth daily.    Historical Provider, MD   BP 147/75 mmHg  Pulse 88  Temp(Src) 98  F (36.7 C) (Oral)  Resp 16  Ht 6\' 4"  (1.93 m)  Wt 220 lb (99.791 kg)  BMI 26.79 kg/m2  SpO2 100% Physical Exam  Constitutional: He appears well-developed and well-nourished. No distress.  HENT:  Head: Normocephalic and atraumatic.  Mouth/Throat: Oropharynx is clear and moist. No oropharyngeal exudate.  Eyes: Conjunctivae are normal. Pupils are equal, round, and reactive to light. Right eye exhibits no discharge. Left eye  exhibits no discharge.  Neck: Normal range of motion. Neck supple.  Cardiovascular: Normal rate, regular rhythm, normal heart sounds and intact distal pulses.  Exam reveals no gallop and no friction rub.   No murmur heard. Pulmonary/Chest: Effort normal and breath sounds normal. No respiratory distress. He has no wheezes. He has no rales.  Abdominal: Soft. Bowel sounds are normal. He exhibits no distension and no mass. There is tenderness. There is no rebound and no guarding.   Patient's abdomen is soft. Patient has some mild  General abdominal tenderness and RUQ abd tenderness. No rebound tenderness. Negative Rovsing sign. Negative psoas and obturator sign.  Musculoskeletal: He exhibits no edema.  Lymphadenopathy:    He has no cervical adenopathy.  Neurological: He is alert. Coordination normal.  Skin: Skin is warm and dry. No rash noted. He is not diaphoretic. No erythema. No pallor.  Psychiatric: He has a normal mood and affect. His behavior is normal.  Nursing note and vitals reviewed.   ED Course  Procedures (including critical care time) Labs Review Labs Reviewed  COMPREHENSIVE METABOLIC PANEL - Abnormal; Notable for the following:    Potassium 3.3 (*)    Glucose, Bld 118 (*)    Creatinine, Ser 1.62 (*)    GFR calc non Af Amer 57 (*)    GFR calc Af Amer 66 (*)    All other components within normal limits  CBC WITH DIFFERENTIAL - Abnormal; Notable for the following:    RBC 4.18 (*)    Hemoglobin 12.2 (*)    HCT 36.8 (*)    Monocytes Relative 15 (*)    Monocytes Absolute 1.5 (*)    All other components within normal limits  URINALYSIS, ROUTINE W REFLEX MICROSCOPIC - Abnormal; Notable for the following:    Color, Urine AMBER (*)    Bilirubin Urine SMALL (*)    Protein, ur 100 (*)    Urobilinogen, UA 2.0 (*)    All other components within normal limits  URINE MICROSCOPIC-ADD ON - Abnormal; Notable for the following:    Bacteria, UA FEW (*)    Casts HYALINE CASTS (*)     All other components within normal limits  LIPASE, BLOOD  PROTEIN / CREATININE RATIO, URINE    Imaging Review [place] Abdomen Complete  03/24/2014   CLINICAL DATA:  28 year old male with abdominal pain, nausea and vomiting for 1 day.  EXAM: ULTRASOUND ABDOMEN COMPLETE  COMPARISON:  None.  FINDINGS: Gallbladder: A 4 mm gallbladder polyp is noted. There is no evidence of cholelithiasis or acute cholecystitis.  Common bile duct: Diameter: 3.6 mm. There is no evidence of intrahepatic or extrahepatic biliary dilatation.  Liver: Slightly heterogeneous echotexture without other abnormality.  IVC: No abnormality visualized.  Pancreas: Visualized portion unremarkable.  Spleen: Size and appearance within normal limits.  Right Kidney: Length: 12.5 cm. Echogenicity within normal limits. No mass or hydronephrosis visualized.  Left Kidney: Length: 12.7 cm. Echogenicity within normal limits. No mass or hydronephrosis visualized.  Abdominal aorta: No aneurysm visualized.  Other findings: None.  IMPRESSION: No evidence  of acute abnormality.  4 mm gallbladder polyp without cholelithiasis or acute cholecystitis.  Slightly heterogeneous hepatic echotexture without other hepatic abnormality.   Electronically Signed   By: Laveda AbbeJeff  Hu M.D.   On: 03/24/2014 22:15     EKG Interpretation None      Filed Vitals:   03/24/14 1916 03/24/14 2137 03/25/14 0038  BP: 133/63 132/70 147/75  Pulse: 106 93 88  Temp: 98.3 F (36.8 C)  98 F (36.7 C)  TempSrc: Oral  Oral  Resp: 19 18 16   Height: 6\' 4"  (1.93 m)    Weight: 220 lb (99.791 kg)    SpO2: 96% 97% 100%     MDM   Meds given in ED:  Medications  ondansetron (ZOFRAN) injection 4 mg (not administered)  sodium chloride 0.9 % bolus 1,000 mL (0 mLs Intravenous Stopped 03/25/14 0038)  ondansetron (ZOFRAN) injection 4 mg (4 mg Intravenous Given 03/24/14 2103)  gi cocktail (Maalox,Lidocaine,Donnatal) (30 mLs Oral Given 03/24/14 2102)    Current Discharge Medication List      Final diagnoses:  Right upper quadrant pain  Dehydration   [name] is a 28 y.o. male with a history of  Acid reflux, bipolar disorder, schizophrenia, and anxiety who presents to the ED complaining of vomiting blood for the past 11 days. The patient reports he has not been drinking much.  The patient has some generalized abdominal pain as well as RUQ abdominal tenderness. Patient's right upper quadrant abdominal ultrasound showed no evidence of acute abnormality. There are 4 mm gallbladder polyp without cholelithiasis or acute cholecystitis. There was no evidence of hydronephrosis bilaterally. Patient's CBC returned with elevated creatinine of 1.62. His previous creatinine a year ago was 0.86. Patient has no history of any kidney problems. A feeling this is likely due to his dehydration due to vomiting. Patient's CBC and lipase are unremarkable. Patient's UA returned with protein, few bacteria and hyaline casts. The patient denies urinary symptoms.  After discussing with Dr. Littie DeedsGentry it was decided that he would benefit from admission for dehydration. The patient is agreeable for admission. Dr. Allena KatzPatel from [facility] agrees to admission for dehydration. Admit to observation.   The patient was discussed with and evaluated by Dr. Littie DeedsGentry who agrees with assessment and plan.        Riley ChambersWilliam Duncan Ollen Rao, PA-C 03/25/14 0126  Mirian MoMatthew Gentry, MD 03/28/14 [phone]

## 2014-03-24 NOTE — ED Notes (Signed)
[place] [facility] c/o lower abd pain n/v onset 1100 today. Ambulatory, A & O, NAD

## 2014-03-24 NOTE — ED Notes (Signed)
Pt aware of the need for a urine sample, Urinal provided.

## 2014-03-24 NOTE — ED Notes (Addendum)
Pt was made aware to keep arm straight to complete fluid bolus. Sandwich and sprite provided to patient per request and PA approval. Pt reports that he is ready to be discharged, He was made aware that we are not finished with his testing.

## 2014-03-25 ENCOUNTER — Emergency Department (HOSPITAL_COMMUNITY)
Admission: EM | Admit: 2014-03-25 | Discharge: 2014-03-26 | Disposition: A | Discharge status: Home / Self Care | Payer: Medicaid Other | Attending: Emergency Medicine | Admitting: Emergency Medicine

## 2014-03-25 ENCOUNTER — Encounter (HOSPITAL_COMMUNITY): Payer: Self-pay | Admitting: *Deleted

## 2014-03-25 DIAGNOSIS — F419 Anxiety disorder, unspecified: Secondary | ICD-10-CM | POA: Diagnosis not present

## 2014-03-25 DIAGNOSIS — F319 Bipolar disorder, unspecified: Secondary | ICD-10-CM | POA: Diagnosis not present

## 2014-03-25 DIAGNOSIS — F209 Schizophrenia, unspecified: Secondary | ICD-10-CM | POA: Diagnosis not present

## 2014-03-25 DIAGNOSIS — Z5900 Homelessness unspecified: Secondary | ICD-10-CM

## 2014-03-25 DIAGNOSIS — Z79899 Other long term (current) drug therapy: Secondary | ICD-10-CM | POA: Diagnosis not present

## 2014-03-25 DIAGNOSIS — Z720 Tobacco use: Secondary | ICD-10-CM | POA: Diagnosis not present

## 2014-03-25 DIAGNOSIS — K219 Gastro-esophageal reflux disease without esophagitis: Secondary | ICD-10-CM | POA: Diagnosis not present

## 2014-03-25 DIAGNOSIS — F250 Schizoaffective disorder, bipolar type: Secondary | ICD-10-CM

## 2014-03-25 DIAGNOSIS — G40909 Epilepsy, unspecified, not intractable, without status epilepticus: Secondary | ICD-10-CM | POA: Insufficient documentation

## 2014-03-25 DIAGNOSIS — N179 Acute kidney failure, unspecified: Principal | ICD-10-CM

## 2014-03-25 DIAGNOSIS — R251 Tremor, unspecified: Secondary | ICD-10-CM | POA: Diagnosis present

## 2014-03-25 DIAGNOSIS — R112 Nausea with vomiting, unspecified: Secondary | ICD-10-CM

## 2014-03-25 DIAGNOSIS — E876 Hypokalemia: Secondary | ICD-10-CM

## 2014-03-25 DIAGNOSIS — K5289 Other specified noninfective gastroenteritis and colitis: Secondary | ICD-10-CM

## 2014-03-25 DIAGNOSIS — E860 Dehydration: Secondary | ICD-10-CM

## 2014-03-25 DIAGNOSIS — Z8659 Personal history of other mental and behavioral disorders: Secondary | ICD-10-CM

## 2014-03-25 DIAGNOSIS — Z760 Encounter for issue of repeat prescription: Secondary | ICD-10-CM

## 2014-03-25 LAB — COMPREHENSIVE METABOLIC PANEL
ALBUMIN: 3 g/dL — AB (ref 3.5–5.2)
ALK PHOS: 46 U/L (ref 39–117)
ALT: 8 U/L (ref 0–53)
AST: 15 U/L (ref 0–37)
Anion gap: 10 (ref 5–15)
BUN: 11 mg/dL (ref 6–23)
CO2: 29 mEq/L (ref 19–32)
Calcium: 9 mg/dL (ref 8.4–10.5)
Chloride: 104 mEq/L (ref 96–112)
Creatinine, Ser: 1.15 mg/dL (ref 0.50–1.35)
GFR calc non Af Amer: 86 mL/min — ABNORMAL LOW (ref >90)
GLUCOSE: 96 mg/dL (ref 70–99)
Potassium: 3.3 mEq/L — ABNORMAL LOW (ref 3.7–5.3)
Sodium: 143 mEq/L (ref 137–147)
Total Bilirubin: 0.3 mg/dL (ref 0.3–1.2)
Total Protein: 5.6 g/dL — ABNORMAL LOW (ref 6.0–8.3)

## 2014-03-25 LAB — PROTEIN / CREATININE RATIO, URINE
Creatinine, Urine: 511.65 mg/dL
Protein Creatinine Ratio: 0.12 (ref 0.00–0.15)
Total Protein, Urine: 62.3 mg/dL

## 2014-03-25 LAB — BASIC METABOLIC PANEL
Anion gap: 10 (ref 5–15)
BUN: 10 mg/dL (ref 6–23)
CHLORIDE: 101 meq/L (ref 96–112)
CO2: 29 meq/L (ref 19–32)
Calcium: 9.5 mg/dL (ref 8.4–10.5)
Creatinine, Ser: 1.03 mg/dL (ref 0.50–1.35)
GFR calc Af Amer: >90 mL/min (ref >90)
GFR calc non Af Amer: >90 mL/min (ref >90)
GLUCOSE: 87 mg/dL (ref 70–99)
POTASSIUM: 3.5 meq/L — AB (ref 3.7–5.3)
Sodium: 140 mEq/L (ref 137–147)

## 2014-03-25 LAB — PROTIME-INR
INR: 1.23 (ref 0.00–1.49)
Prothrombin Time: 15.7 seconds — ABNORMAL HIGH (ref 11.6–15.2)

## 2014-03-25 LAB — CBC
HEMATOCRIT: 32.2 % — AB (ref 39.0–52.0)
HEMOGLOBIN: 10.3 g/dL — AB (ref 13.0–17.0)
MCH: 28.4 pg (ref 26.0–34.0)
MCHC: 32 g/dL (ref 30.0–36.0)
MCV: 88.7 fL (ref 78.0–100.0)
Platelets: 156 10*3/uL (ref 150–400)
RBC: 3.63 MIL/uL — ABNORMAL LOW (ref 4.22–5.81)
RDW: 12.3 % (ref 11.5–15.5)
WBC: 7.5 10*3/uL (ref 4.0–10.5)

## 2014-03-25 LAB — MAGNESIUM: Magnesium: 1.9 mg/dL (ref 1.5–2.5)

## 2014-03-25 MED ORDER — SODIUM CHLORIDE 0.9 % IV SOLN
INTRAVENOUS | Status: DC
Start: 2014-03-25 — End: 2014-03-25

## 2014-03-25 MED ORDER — HEPARIN SODIUM (PORCINE) 5000 UNIT/ML IJ SOLN
5000.0000 [IU] | Freq: Three times a day (TID) | INTRAMUSCULAR | Status: DC
  Filled 2014-03-25 (×5): qty 1

## 2014-03-25 MED ORDER — ONDANSETRON HCL 4 MG/2ML IJ SOLN: 4.0000 mg | Freq: Three times a day (TID) | INTRAMUSCULAR | Status: AC | PRN

## 2014-03-25 MED ORDER — ONDANSETRON HCL 4 MG PO TABS: 4.0000 mg | ORAL_TABLET | Freq: Four times a day (QID) | ORAL | Status: DC | PRN

## 2014-03-25 MED ORDER — ACETAMINOPHEN 325 MG PO TABS: 650.0000 mg | ORAL_TABLET | Freq: Four times a day (QID) | ORAL | Status: DC | PRN

## 2014-03-25 MED ORDER — LITHIUM CARBONATE ER 300 MG PO TBCR
600.0000 mg | EXTENDED_RELEASE_TABLET | Freq: Two times a day (BID) | ORAL | Status: DC
  Administered 2014-03-25: 600 mg via ORAL
  Filled 2014-03-25 (×2): qty 2

## 2014-03-25 MED ORDER — LEVOTHYROXINE SODIUM 25 MCG PO TABS
25.0000 ug | ORAL_TABLET | Freq: Every day | ORAL | Status: DC
  Administered 2014-03-25: 25 ug via ORAL
  Filled 2014-03-25 (×2): qty 1

## 2014-03-25 MED ORDER — ACETAMINOPHEN 650 MG RE SUPP: 650.0000 mg | Freq: Four times a day (QID) | RECTAL | Status: DC | PRN

## 2014-03-25 MED ORDER — PANTOPRAZOLE SODIUM 40 MG PO TBEC
40.0000 mg | DELAYED_RELEASE_TABLET | Freq: Every day | ORAL | Status: DC
  Administered 2014-03-25: 40 mg via ORAL
  Filled 2014-03-25: qty 1

## 2014-03-25 MED ORDER — ONDANSETRON HCL 4 MG/2ML IJ SOLN: 4.0000 mg | Freq: Four times a day (QID) | INTRAMUSCULAR | Status: DC | PRN

## 2014-03-25 MED ORDER — POTASSIUM CHLORIDE CRYS ER 20 MEQ PO TBCR
40.0000 meq | EXTENDED_RELEASE_TABLET | ORAL | Status: AC
  Administered 2014-03-25 (×2): 40 meq via ORAL
  Filled 2014-03-25 (×2): qty 2

## 2014-03-25 MED ORDER — ZOLPIDEM TARTRATE 5 MG PO TABS
5.0000 mg | ORAL_TABLET | Freq: Every evening | ORAL | Status: DC | PRN
  Administered 2014-03-25: 5 mg via ORAL
  Filled 2014-03-25: qty 1

## 2014-03-25 MED ORDER — DIVALPROEX SODIUM 500 MG PO DR TAB
1000.0000 mg | DELAYED_RELEASE_TABLET | Freq: Two times a day (BID) | ORAL | Status: DC
  Administered 2014-03-25 (×2): 1000 mg via ORAL
  Filled 2014-03-25 (×3): qty 2

## 2014-03-25 MED ORDER — OLANZAPINE 10 MG PO TBDP
20.0000 mg | ORAL_TABLET | Freq: Every day | ORAL | Status: DC
  Filled 2014-03-25: qty 2

## 2014-03-25 NOTE — Progress Notes (Signed)
Pt discharged to [address] bus stop and provided with bus pass.  Discharge instructions given and patient verbalized understanding.

## 2014-03-25 NOTE — Progress Notes (Signed)
UR completed 

## 2014-03-25 NOTE — H&P (Signed)
[facility] History and Physical  Patient: [name]  ZOX:[id number]  DOB: 03/24/1986  DOS: the patient was seen and examined on 03/25/2014 PCP: PROVIDER NOT IN SYSTEM  Chief Complaint: Nausea and vomiting  HPI: [name] is a 28 y.o. male with Past medical history of bipolar disorder, seizures.. The patient is presenting with complaint of nausea and vomiting. Symptoms have been ongoing since last 2 weeks as per the patient. He denies any nausea the time of my evaluation. He denies any diarrhea denies any constipation denies any fever or chills. He mentions that he has been trying to eat but due to nausea is unable to keep anything down. He denies any cough, abdominal pain, burning urination. Lives in a group home and does not have any guardian.  The patient is coming from home. And at his baseline independent for most of his ADL.  Review of Systems: as mentioned in the history of present illness.  A Comprehensive review of the other systems is negative.  Past Medical History  Diagnosis Date  . Bipolar 1 disorder   . Schizophrenia, acute   . Seizures   . Acid reflux   . Anxiety    Past Surgical History  Procedure Laterality Date  . Abdominal surgery     Social History:  reports that he has been smoking Cigarettes.  He has been smoking about 0.50 packs per day. He does not have any smokeless tobacco history on file. He reports that he drinks alcohol. He reports that he does not use illicit drugs.  Allergies  Allergen Reactions  . Benadryl [Diphenhydramine Hcl] Other (See Comments)    No family history on file.  Prior to Admission medications   Medication Sig Start Date End Date Taking? Authorizing Provider  divalproex (DEPAKOTE) 500 MG DR tablet Take 1,000 mg by mouth 2 (two) times daily.   Yes Historical Provider, MD  ibuprofen (ADVIL,MOTRIN) 800 MG tablet Take 1 tablet (800 mg total) by mouth 3 (three) times daily. 12/13/12  Yes Jennifer L Piepenbrink, PA-C   levothyroxine (SYNTHROID, LEVOTHROID) 25 MCG tablet Take 25 mcg by mouth daily before breakfast.   Yes Historical Provider, MD  lithium carbonate (LITHOBID) 300 MG CR tablet Take 600 mg by mouth 2 (two) times daily.   Yes Historical Provider, MD  OLANZapine zydis (ZYPREXA) 20 MG disintegrating tablet Take 20 mg by mouth at bedtime.   Yes Historical Provider, MD  omeprazole (PRILOSEC) 20 MG capsule Take 20 mg by mouth daily.    Historical Provider, MD    Physical Exam: Filed Vitals:   03/24/14 2137 03/25/14 0038 03/25/14 0101 03/25/14 0535  BP: 132/70 147/75 120/104 99/48  Pulse: 93 88 97 65  Temp:  98 F (36.7 C) 98.4 F (36.9 C) 98.1 F (36.7 C)  TempSrc:  Oral Oral Oral  Resp: 18 16 20 16   Height:      Weight:      SpO2: 97% 100% 100% 96%    General: Alert, Awake and Oriented to Time, Place and Person. Appear in mild distress Eyes: PERRL ENT: Oral Mucosa clear moist. Neck: no JVD Cardiovascular: S1 and S2 Present, no Murmur, Peripheral Pulses Present Respiratory: Bilateral Air entry equal and Decreased, Clear to Auscultation, nboCrackles, no wheezes Abdomen: Bowel Sound present, Soft and non tender Skin: no Rash Extremities: no Pedal edema, no calf tenderness Neurologic: Grossly no focal neuro deficit.  Labs on Admission:  CBC:  Recent Labs Lab 03/24/14 2106 03/25/14 0443  WBC 10.0 7.5  NEUTROABS 6.6  --   HGB 12.2* 10.3*  HCT 36.8* 32.2*  MCV 88.0 88.7  PLT 170 156    CMP     Component Value Date/Time   NA 143 03/25/2014 0443   K 3.3* 03/25/2014 0443   CL 104 03/25/2014 0443   CO2 29 03/25/2014 0443   GLUCOSE 96 03/25/2014 0443   BUN 11 03/25/2014 0443   CREATININE 1.15 03/25/2014 0443   CALCIUM 9.0 03/25/2014 0443   PROT 5.6* 03/25/2014 0443   ALBUMIN 3.0* 03/25/2014 0443   AST 15 03/25/2014 0443   ALT 8 03/25/2014 0443   ALKPHOS 46 03/25/2014 0443   BILITOT 0.3 03/25/2014 0443   GFRNONAA 86* 03/25/2014 0443   GFRAA >90 03/25/2014 0443      Recent Labs Lab 03/24/14 2106  LIPASE 38   No results for input(s): AMMONIA in the last 168 hours.  No results for input(s): CKTOTAL, CKMB, CKMBINDEX, TROPONINI in the last 168 hours. BNP (last 3 results) No results for input(s): PROBNP in the last 8760 hours.  Radiological Exams on Admission: [place] Abdomen Complete  03/24/2014   CLINICAL DATA:  28 year old male with abdominal pain, nausea and vomiting for 1 day.  EXAM: ULTRASOUND ABDOMEN COMPLETE  COMPARISON:  None.  FINDINGS: Gallbladder: A 4 mm gallbladder polyp is noted. There is no evidence of cholelithiasis or acute cholecystitis.  Common bile duct: Diameter: 3.6 mm. There is no evidence of intrahepatic or extrahepatic biliary dilatation.  Liver: Slightly heterogeneous echotexture without other abnormality.  IVC: No abnormality visualized.  Pancreas: Visualized portion unremarkable.  Spleen: Size and appearance within normal limits.  Right Kidney: Length: 12.5 cm. Echogenicity within normal limits. No mass or hydronephrosis visualized.  Left Kidney: Length: 12.7 cm. Echogenicity within normal limits. No mass or hydronephrosis visualized.  Abdominal aorta: No aneurysm visualized.  Other findings: None.  IMPRESSION: No evidence of acute abnormality.  4 mm gallbladder polyp without cholelithiasis or acute cholecystitis.  Slightly heterogeneous hepatic echotexture without other hepatic abnormality.   Electronically Signed   By: Laveda AbbeJeff  Hu M.D.   On: 03/24/2014 22:15    Assessment/Plan Principal Problem:   AKI (acute kidney injury) Active Problems:   Schizoaffective disorder, bipolar type   Dehydration   Nausea with vomiting   Hypokalemia   1. AKI (acute kidney injury) The patient is presenting with numbness of nausea vomiting with poor oral intake that has been ongoing since last 2 weeks. At present patient has hypokalemia as well as mild acute kidney injury. Most likely etiology is a nausea and vomiting. At present I would give  him IV hydration. Continue monitoring his BMP. Patient is admitted for observation due to possibility of acute kidney injury.  2. Bipolar disorder. Continue home medications per pharmacy.  4.Nausea and vomiting. Etiology currently currently unclear ultrasound of the abdomen is unremarkable. Patient's abdominal examination is also unremarkable. Nausea and vomiting currently resolved. Continue close monitorin  DVT Prophylaxis: subcutaneous Heparin Nutrition: Liquid diet  Disposition: Admitted to observation in med-surge unit.  Author: Lynden OxfordPranav Shayden Gingrich, MD [facility] Pager: [phone] 03/25/2014, 6:51 AM    If 7PM-7AM, please contact night-coverage [email or web address] Password [email or web address]

## 2014-03-25 NOTE — ED Provider Notes (Signed)
CSN: [id number]     Arrival date & time 03/25/14  2218 History   First MD Initiated Contact with Patient 03/25/14 2239     Chief Complaint  Patient presents with  . Tremors     (Consider location/radiation/quality/duration/timing/severity/associated sxs/prior Treatment) HPI [name] is a 28 year old male with past medical history of bipolar 1 disorder, schizophrenia, seizures who presents the ER reporting an episode of a seizure tonight. Patient states he was outside, and had a sudden onset of shaking of his arms, and shaking of his head. Patient states he can recall the entire incident, denies loss of consciousness, denies urinary incontinence or bowel incontinence. Patient states he was asymptomatic immediately afterward. EMS was called for patient, and EMS noted no seizure activity or postictal period. Patient states his only complaint in the ER is that he wishes to have [facility] placement.    Past Medical History  Diagnosis Date  . Bipolar 1 disorder   . Schizophrenia, acute   . Seizures   . Acid reflux   . Anxiety    Past Surgical History  Procedure Laterality Date  . Abdominal surgery     History reviewed. No pertinent family history. History  Substance Use Topics  . Smoking status: Current Every Day Smoker -- 0.50 packs/day    Types: Cigarettes  . Smokeless tobacco: Not on file  . Alcohol Use: Yes    Review of Systems  Constitutional: Negative for fever.  HENT: Negative for trouble swallowing.   Eyes: Negative for visual disturbance.  Respiratory: Negative for shortness of breath.   Cardiovascular: Negative for chest pain.  Gastrointestinal: Negative for nausea, vomiting and abdominal pain.  Genitourinary: Negative for dysuria.  Musculoskeletal: Negative for neck pain.  Skin: Negative for rash.  Neurological: Negative for dizziness, weakness and numbness.  Psychiatric/Behavioral: Negative.       Allergies  Benadryl  Home Medications   Prior to Admission  medications   Medication Sig Start Date End Date Taking? Authorizing Provider  divalproex (DEPAKOTE) 500 MG DR tablet Take 1,000 mg by mouth 2 (two) times daily.    Historical Provider, MD  levothyroxine (SYNTHROID, LEVOTHROID) 25 MCG tablet Take 25 mcg by mouth daily before breakfast.    Historical Provider, MD  lithium carbonate (LITHOBID) 300 MG CR tablet Take 600 mg by mouth 2 (two) times daily.    Historical Provider, MD  OLANZapine zydis (ZYPREXA) 20 MG disintegrating tablet Take 20 mg by mouth at bedtime.    Historical Provider, MD  omeprazole (PRILOSEC) 20 MG capsule Take 20 mg by mouth daily.    Historical Provider, MD   BP 121/82 mmHg  Pulse 81  Temp(Src) 98.6 F (37 C) (Oral)  Resp 16  SpO2 100% Physical Exam  Constitutional: He is oriented to person, place, and time. He appears well-developed and well-nourished. No distress.  HENT:  Head: Normocephalic and atraumatic.  Eyes: Right eye exhibits no discharge. Left eye exhibits no discharge. No scleral icterus.  Neck: Normal range of motion.  Cardiovascular: Normal rate, regular rhythm and normal heart sounds.   Pulmonary/Chest: Effort normal and breath sounds normal. No respiratory distress.  Abdominal: Normal appearance and bowel sounds are normal. There is no tenderness.  Musculoskeletal: Normal range of motion.  Neurological: He is alert and oriented to person, place, and time.  Skin: Skin is warm and dry. He is not diaphoretic.  Psychiatric:  Patient's mood is euthymic with affect appropriate to mood. Speech is normal and communicative, however inappropriate in that patient  continues to yell things out of his door at people passing by, and imitating laughing. Patient's behavior is withdrawn during my interview, however he continues to expose himself in the hallway, and speaking to the wall and yelling in the hallway. Thought content involves around patient's wish to stay in a [facility], stating that he needs  help with medications. Patient denies suicidal or homicidal ideation. Judgment and insight are impaired.  Nursing note and vitals reviewed.   ED Course  Procedures (including critical care time) Labs Review Labs Reviewed  CBC WITH DIFFERENTIAL - Abnormal; Notable for the following:    RBC 4.00 (*)    Hemoglobin 11.2 (*)    HCT 35.6 (*)    Monocytes Relative 19 (*)    Monocytes Absolute 1.5 (*)    All other components within normal limits  COMPREHENSIVE METABOLIC PANEL - Abnormal; Notable for the following:    Glucose, Bld 102 (*)    Total Bilirubin <0.2 (*)    All other components within normal limits  [name]  URINE RAPID DRUG SCREEN (HOSP PERFORMED)    Imaging Review [place] Abdomen Complete  03/24/2014   CLINICAL DATA:  28 year old male with abdominal pain, nausea and vomiting for 1 day.  EXAM: ULTRASOUND ABDOMEN COMPLETE  COMPARISON:  None.  FINDINGS: Gallbladder: A 4 mm gallbladder polyp is noted. There is no evidence of cholelithiasis or acute cholecystitis.  Common bile duct: Diameter: 3.6 mm. There is no evidence of intrahepatic or extrahepatic biliary dilatation.  Liver: Slightly heterogeneous echotexture without other abnormality.  IVC: No abnormality visualized.  Pancreas: Visualized portion unremarkable.  Spleen: Size and appearance within normal limits.  Right Kidney: Length: 12.5 cm. Echogenicity within normal limits. No mass or hydronephrosis visualized.  Left Kidney: Length: 12.7 cm. Echogenicity within normal limits. No mass or hydronephrosis visualized.  Abdominal aorta: No aneurysm visualized.  Other findings: None.  IMPRESSION: No evidence of acute abnormality.  4 mm gallbladder polyp without cholelithiasis or acute cholecystitis.  Slightly heterogeneous hepatic echotexture without other hepatic abnormality.   Electronically Signed   By: Laveda AbbeJeff  Hu M.D.   On: 03/24/2014 22:15     EKG Interpretation None      MDM   Final diagnoses:  Issue of repeat prescription for  medication  History of schizophrenia    Patient here complaining of episode of "seizure activity". Patient stating he was shaking his arms and his head while standing up outside of a restaurant downtown. Patient states he was having "one of my standing seizures". Patient states he remembers the entire event, did not lose consciousness, and EMS did not notice any seizure activity or postictal mentation during their contact with patient. Patient denying urinary or bowel incontinence, and stating to me that he is only here to try to get placement into [facility].  Based on patient's description of his tremors earlier, it does not sound like seizure activity. We will workup patient for medical clearance for psych assessment. Consult TTS.  TTS assessed patient, and psychiatry mid-level assessed patient by tele-psych conference. Behavioral Health recommends that patient should be discharged and given him follow-up as outpatient. They state he does not meet criteria for inpatient therapy. They also strongly recommend patient speak to a social worker to help facilitate this process before leaving. We will allow patient to stay in our psych hold tonight until he is able to meet with the social worker in the morning. After social worker speaks with patient he is to be discharged.  Pt  signed out to Sharilyn SitesLisa Sanders, PA-C to discharge patient after his meeting with social worker.   Signed,  Ladona MowJoe Blaike Vickers, PA-C 6:54 AM    Monte FantasiaJoseph W Tilden Broz, PA-C 03/26/14 [id number]  Olivia Mackielga M Otter, MD 03/27/14 [phone]

## 2014-03-25 NOTE — Discharge Summary (Signed)
Physician Discharge Summary  Riley LamyJoseph Cassidy MVH:[id number] DOB: 09/07/1985 DOA: 03/24/2014  PCP: PROVIDER NOT IN SYSTEM  Admit date: 03/24/2014 Discharge date: 03/25/2014  Time spent: 45 minutes   Discharge Condition: stable Diet recommendation: regular diet  Discharge Diagnoses:  Principal Problem:   AKI (acute kidney injury) Active Problems:   Schizoaffective disorder, bipolar type   Dehydration   Nausea with vomiting   Hypokalemia   History of present illness:  This is a 28 year old male with a past medical history of bipolar disorder and seizures presenting with nausea vomiting and diarrhea for the past 2 weeks and found to have acute renal failure and hypokalemia.  Hospital Course:  Acute kidney injury -Creatinine of 1.62 on admission has improved to 1.15- no further workup at this point as this was likely secondary to GI issues.  Nausea vomiting diarrhea -Suspect gastroenteritis which has now resolved-patient tolerating solids without any symptoms.  Hypokalemia -Secondary to GI issues-has been replaced with 80 meq of KCL- repeat K 3.5  Procedures:  None  Consultations:  None  Discharge Exam: Filed Weights   03/24/14 1916  Weight: 99.791 kg (220 lb)   Filed Vitals:   03/25/14 0535  BP: 99/48  Pulse: 65  Temp: 98.1 F (36.7 C)  Resp: 16    General: AAO x 3, no distress Cardiovascular: RRR, no murmurs  Respiratory: clear to auscultation bilaterally GI: soft, non-tender, non-distended, bowel sound positive  Discharge Instructions You were cared for by a hospitalist during your hospital stay. If you have any questions about your discharge medications or the care you received while you were in the hospital after you are discharged, you can call the unit and asked to speak with the hospitalist on call if the hospitalist that took care of you is not available. Once you are discharged, your primary care physician will handle any further medical issues. Please  note that NO REFILLS for any discharge medications will be authorized once you are discharged, as it is imperative that you return to your primary care physician (or establish a relationship with a primary care physician if you do not have one) for your aftercare needs so that they can reassess your need for medications and monitor your lab values.     Medication List    ASK your doctor about these medications        divalproex 500 MG DR tablet  Commonly known as:  DEPAKOTE  Take 1,000 mg by mouth 2 (two) times daily.     ibuprofen 800 MG tablet  Commonly known as:  ADVIL,MOTRIN  Take 1 tablet (800 mg total) by mouth 3 (three) times daily.     levothyroxine 25 MCG tablet  Commonly known as:  SYNTHROID, LEVOTHROID  Take 25 mcg by mouth daily before breakfast.     lithium carbonate 300 MG CR tablet  Commonly known as:  LITHOBID  Take 600 mg by mouth 2 (two) times daily.     OLANZapine zydis 20 MG disintegrating tablet  Commonly known as:  ZYPREXA  Take 20 mg by mouth at bedtime.     omeprazole 20 MG capsule  Commonly known as:  PRILOSEC  Take 20 mg by mouth daily.       Allergies  Allergen Reactions  . Benadryl [Diphenhydramine Hcl] Other (See Comments)      The results of significant diagnostics from this hospitalization (including imaging, microbiology, ancillary and laboratory) are listed below for reference.    Significant Diagnostic Studies: [place] Abdomen Complete  03/24/2014   CLINICAL DATA:  28 year old male with abdominal pain, nausea and vomiting for 1 day.  EXAM: ULTRASOUND ABDOMEN COMPLETE  COMPARISON:  None.  FINDINGS: Gallbladder: A 4 mm gallbladder polyp is noted. There is no evidence of cholelithiasis or acute cholecystitis.  Common bile duct: Diameter: 3.6 mm. There is no evidence of intrahepatic or extrahepatic biliary dilatation.  Liver: Slightly heterogeneous echotexture without other abnormality.  IVC: No abnormality visualized.  Pancreas: Visualized  portion unremarkable.  Spleen: Size and appearance within normal limits.  Right Kidney: Length: 12.5 cm. Echogenicity within normal limits. No mass or hydronephrosis visualized.  Left Kidney: Length: 12.7 cm. Echogenicity within normal limits. No mass or hydronephrosis visualized.  Abdominal aorta: No aneurysm visualized.  Other findings: None.  IMPRESSION: No evidence of acute abnormality.  4 mm gallbladder polyp without cholelithiasis or acute cholecystitis.  Slightly heterogeneous hepatic echotexture without other hepatic abnormality.   Electronically Signed   By: Laveda AbbeJeff  Hu M.D.   On: 03/24/2014 22:15    Microbiology: No results found for this or any previous visit (from the past 240 hour(s)).   Labs: Basic Metabolic Panel:  Recent Labs Lab 03/24/14 2106 03/25/14 0443  NA 142 143  K 3.3* 3.3*  CL 103 104  CO2 26 29  GLUCOSE 118* 96  BUN 12 11  CREATININE 1.62* 1.15  CALCIUM 9.9 9.0  MG  --  1.9   Liver Function Tests:  Recent Labs Lab 03/24/14 2106 03/25/14 0443  AST 18 15  ALT 10 8  ALKPHOS 59 46  BILITOT 0.3 0.3  PROT 7.2 5.6*  ALBUMIN 4.0 3.0*    Recent Labs Lab 03/24/14 2106  LIPASE 38   No results for input(s): AMMONIA in the last 168 hours. CBC:  Recent Labs Lab 03/24/14 2106 03/25/14 0443  WBC 10.0 7.5  NEUTROABS 6.6  --   HGB 12.2* 10.3*  HCT 36.8* 32.2*  MCV 88.0 88.7  PLT 170 156   Cardiac Enzymes: No results for input(s): CKTOTAL, CKMB, CKMBINDEX, TROPONINI in the last 168 hours. BNP: BNP (last 3 results) No results for input(s): PROBNP in the last 8760 hours. CBG: No results for input(s): GLUCAP in the last 168 hours.     SignedCalvert Cantor:  Lillia Lengel, MD [facility] 03/25/2014, 11:06 AM

## 2014-03-25 NOTE — ED Notes (Signed)
Walked by pts room and pt is standing in doorway with penis hanging out and laughing.  [employer] and Security  called

## 2014-03-25 NOTE — ED Notes (Signed)
Pt standing at door with penis out. Pt informed not to do that. Now pt staring at wall and yelling. [employer] and security at bedside

## 2014-03-25 NOTE — ED Notes (Signed)
Pt in via EMS to triage, EMS was called to [place] and patient was sitting outside and patient stated he was concerned he had a seizure because he was shaking all over, no seizure activity noted for EMS, alert and oriented, pt also has a runny nose.

## 2014-03-25 NOTE — ED Notes (Signed)
Report given to 5E 

## 2014-03-26 LAB — CBC WITH DIFFERENTIAL/PLATELET
BASOS PCT: 1 % (ref 0–1)
Basophils Absolute: 0.1 10*3/uL (ref 0.0–0.1)
Eosinophils Absolute: 0.1 10*3/uL (ref 0.0–0.7)
Eosinophils Relative: 2 % (ref 0–5)
HCT: 35.6 % — ABNORMAL LOW (ref 39.0–52.0)
HEMOGLOBIN: 11.2 g/dL — AB (ref 13.0–17.0)
Lymphocytes Relative: 32 % (ref 12–46)
Lymphs Abs: 2.6 10*3/uL (ref 0.7–4.0)
MCH: 28 pg (ref 26.0–34.0)
MCHC: 31.5 g/dL (ref 30.0–36.0)
MCV: 89 fL (ref 78.0–100.0)
MONOS PCT: 19 % — AB (ref 3–12)
Monocytes Absolute: 1.5 10*3/uL — ABNORMAL HIGH (ref 0.1–1.0)
NEUTROS PCT: 46 % (ref 43–77)
Neutro Abs: 3.8 10*3/uL (ref 1.7–7.7)
Platelets: 184 10*3/uL (ref 150–400)
RBC: 4 MIL/uL — ABNORMAL LOW (ref 4.22–5.81)
RDW: 12.5 % (ref 11.5–15.5)
WBC: 8.2 10*3/uL (ref 4.0–10.5)

## 2014-03-26 LAB — COMPREHENSIVE METABOLIC PANEL
ALBUMIN: 3.7 g/dL (ref 3.5–5.2)
ALK PHOS: 55 U/L (ref 39–117)
ALT: 10 U/L (ref 0–53)
ANION GAP: 12 (ref 5–15)
AST: 21 U/L (ref 0–37)
BUN: 11 mg/dL (ref 6–23)
CO2: 27 mEq/L (ref 19–32)
CREATININE: 0.97 mg/dL (ref 0.50–1.35)
Calcium: 9.5 mg/dL (ref 8.4–10.5)
Chloride: 105 mEq/L (ref 96–112)
GFR calc Af Amer: >90 mL/min (ref >90)
GFR calc non Af Amer: >90 mL/min (ref >90)
Glucose, Bld: 102 mg/dL — ABNORMAL HIGH (ref 70–99)
POTASSIUM: 4.1 meq/L (ref 3.7–5.3)
Sodium: 144 mEq/L (ref 137–147)
Total Bilirubin: <0.2 mg/dL — ABNORMAL LOW (ref 0.3–1.2)
Total Protein: 7 g/dL (ref 6.0–8.3)

## 2014-03-26 LAB — ETHANOL

## 2014-03-26 MED ORDER — ACETAMINOPHEN 325 MG PO TABS: 650.0000 mg | ORAL_TABLET | ORAL | Status: DC | PRN

## 2014-03-26 MED ORDER — OLANZAPINE 10 MG PO TBDP
20.0000 mg | ORAL_TABLET | Freq: Every day | ORAL | Status: DC
  Administered 2014-03-26: 20 mg via ORAL
  Filled 2014-03-26 (×2): qty 2

## 2014-03-26 MED ORDER — IBUPROFEN 400 MG PO TABS: 600.0000 mg | ORAL_TABLET | Freq: Three times a day (TID) | ORAL | Status: DC | PRN

## 2014-03-26 MED ORDER — NICOTINE 21 MG/24HR TD PT24
21.0000 mg | MEDICATED_PATCH | Freq: Every day | TRANSDERMAL | Status: DC
  Administered 2014-03-26: 21 mg via TRANSDERMAL

## 2014-03-26 MED ORDER — DIVALPROEX SODIUM 250 MG PO DR TAB
1000.0000 mg | DELAYED_RELEASE_TABLET | Freq: Two times a day (BID) | ORAL | Status: DC
  Administered 2014-03-26 (×2): 1000 mg via ORAL

## 2014-03-26 MED ORDER — LORAZEPAM 1 MG PO TABS: 1.0000 mg | ORAL_TABLET | Freq: Three times a day (TID) | ORAL | Status: DC | PRN

## 2014-03-26 MED ORDER — ZOLPIDEM TARTRATE 5 MG PO TABS: 5.0000 mg | ORAL_TABLET | Freq: Every evening | ORAL | Status: DC | PRN

## 2014-03-26 MED ORDER — ONDANSETRON HCL 4 MG PO TABS: 4.0000 mg | ORAL_TABLET | Freq: Three times a day (TID) | ORAL | Status: DC | PRN

## 2014-03-26 MED ORDER — ALUM & MAG HYDROXIDE-SIMETH 200-200-20 MG/5ML PO SUSP: 30.0000 mL | ORAL | Status: DC | PRN

## 2014-03-26 NOTE — BH Assessment (Signed)
Spoke with Jinny SandersJoseph Mintz, PA-C prior to initiating assessment. Hx of schizophrenia and bipolar and out of his medication for about 2 weeks. Came to ED stating that he had seizure but no evidence of this. Pt was admitted on the second due to kidney injury was treated for dehydration and released 03-25-14. Pt has no somatic complaints at this time, labs are pending.   Requested cart be placed with pt for assessment.   Assessment to commence shortly.   Clista BernhardtNancy Dalten Ambrosino, [facility] Triage Specialist 03/26/2014 12:55 AM

## 2014-03-26 NOTE — Clinical Social Work Note (Signed)
Clinical Social Worker [facility] does not meet inpatient criteria for placement.  CSW to meet with patient at bedside shortly to discuss possible options at discharge.  Per MD note, patient needing group home placement - CSW to further explore.    Macario GoldsJesse Aracelia Brinson, [place] [phone]

## 2014-03-26 NOTE — ED Notes (Signed)
Pt had a BM in the bed, also voided in the bed. He is up and in the bathroom now. Very loose stools.

## 2014-03-26 NOTE — Discharge Instructions (Signed)
Schizophrenia °Schizophrenia is a mental illness. It may cause disturbed or disorganized thinking, speech, or behavior. People with schizophrenia have problems functioning in one or more areas of life: work, school, home, or relationships. People with schizophrenia are at increased risk for suicide, certain chronic physical illnesses, and unhealthy behaviors, such as smoking and drug use. °People who have family members with schizophrenia are at higher risk of developing the illness. Schizophrenia affects men and women equally but usually appears at an earlier age (teenage or early adult years) in men.  °SYMPTOMS °The earliest symptoms are often subtle (prodrome) and may go unnoticed until the illness becomes more severe (first-break psychosis). Symptoms of schizophrenia may be continuous or may come and go in severity. Episodes often are triggered by major life events, such as family stress, college, military service, marriage, pregnancy or child birth, divorce, or loss of a loved one. People with schizophrenia may see, hear, or feel things that do not exist (hallucinations). They may have false beliefs in spite of obvious proof to the contrary (delusions). Sometimes speech is incoherent or behavior is odd or withdrawn.  °DIAGNOSIS °Schizophrenia is diagnosed through an assessment by your caregiver. Your caregiver will ask questions about your thoughts, behavior, mood, and ability to function in daily life. Your caregiver may ask questions about your medical history and use of alcohol or drugs, including prescription medication. Your caregiver may also order blood tests and imaging exams. Certain medical conditions and substances can cause symptoms that resemble schizophrenia. Your caregiver may refer you to a mental health specialist for evaluation. There are three major criterion for a diagnosis of schizophrenia: °· Two or more of the following five symptoms are present for a month or longer: °¨ Delusions. Often  the delusions are that you are being attacked, harassed, cheated, persecuted or conspired against (persecutory delusions). °¨ Hallucinations.   °¨ Disorganized speech that does not make sense to others. °¨ Grossly disorganized (confused or unfocused) behavior or extremely overactive or underactive motor activity (catatonia). °¨ Negative symptoms such as bland or blunted emotions (flat affect), loss of will power (avolition), and withdrawal from social contacts (social isolation). °· Level of functioning in one or more major areas of life (work, school, relationships, or self-care) is markedly below the level of functioning before the onset of illness.   °· There are continuous signs of illness (either mild symptoms or decreased level of functioning) for at least 6 months or longer. °TREATMENT  °Schizophrenia is a long-term illness. It is best controlled with continuous treatment rather than treatment only when symptoms occur. The following treatments are used to manage schizophrenia: °· Medication--Medication is the most effective and important form of treatment for schizophrenia. Antipsychotic medications are usually prescribed to help manage schizophrenia. Other types of medication may be added to relieve any symptoms that may occur despite the use of antipsychotic medications. °· Counseling or talk therapy--Individual, group, or family counseling may be helpful in providing education, support, and guidance. Many people with schizophrenia also benefit from social skills and job skills (vocational) training. °A combination of medication and counseling is best for managing the disorder over time. A procedure in which electricity is applied to the brain through the scalp (electroconvulsive therapy) may be used to treat catatonic schizophrenia or schizophrenia in people who cannot take or do not respond to medication and counseling. °Document Released: 04/06/2000 Document Revised: 12/10/2012 Document Reviewed:  07/02/2012 °ExitCare® Patient Information ©2015 ExitCare, LLC. This information is not intended to replace advice given to you by   your health care provider. Make sure you discuss any questions you have with your health care provider.  Bipolar Disorder Bipolar disorder is a mental illness. The term bipolar disorder actually is used to describe a group of disorders that all share varying degrees of emotional highs and lows that can interfere with daily functioning, such as work, school, or relationships. Bipolar disorder also can lead to drug abuse, hospitalization, and suicide. The emotional highs of bipolar disorder are periods of elation or irritability and high energy. These highs can range from a mild form (hypomania) to a severe form (mania). People experiencing episodes of hypomania may appear energetic, excitable, and highly productive. People experiencing mania may behave impulsively or erratically. They often make poor decisions. They may have difficulty sleeping. The most severe episodes of mania can involve having very distorted beliefs or perceptions about the world and seeing or hearing things that are not real (psychotic delusions and hallucinations).  The emotional lows of bipolar disorder (depression) also can range from mild to severe. Severe episodes of bipolar depression can involve psychotic delusions and hallucinations. Sometimes people with bipolar disorder experience a state of mixed mood. Symptoms of hypomania or mania and depression are both present during this mixed-mood episode. SIGNS AND SYMPTOMS There are signs and symptoms of the episodes of hypomania and mania as well as the episodes of depression. The signs and symptoms of hypomania and mania are similar but vary in severity. They include: Inflated self-esteem or feeling of increased self-confidence. Decreased need for sleep. Unusual talkativeness (rapid or pressured speech) or the feeling of a need to keep  talking. Sensation of racing thoughts or constant talking, with quick shifts between topics that may or may not be related (flight of ideas). Decreased ability to focus or concentrate. Increased purposeful activity, such as work, studies, or social activity, or nonproductive activity, such as pacing, squirming and fidgeting, or finger and toe tapping. Impulsive behavior and use of poor judgment, resulting in high-risk activities, such as having unprotected sex or spending excessive amounts of money. Signs and symptoms of depression include the following:  Feelings of sadness, hopelessness, or helplessness. Frequent or uncontrollable episodes of crying. Lack of feeling anything or caring about anything. Difficulty sleeping or sleeping too much. Inability to enjoy the things you used to enjoy.  Desire to be alone all the time.  Feelings of guilt or worthlessness. Lack of energy or motivation.  Difficulty concentrating, remembering, or making decisions. Change in appetite or weight beyond normal fluctuations. Thoughts of death or the desire to harm yourself. DIAGNOSIS  Bipolar disorder is diagnosed through an assessment by your caregiver. Your caregiver will ask questions about your emotional episodes. There are two main types of bipolar disorder. People with type I bipolar disorder have manic episodes with or without depressive episodes. People with type II bipolar disorder have hypomanic episodes and major depressive episodes, which are more serious than mild depression. The type of bipolar disorder you have can make an important difference in how your illness is monitored and treated. Your caregiver may ask questions about your medical history and use of alcohol or drugs, including prescription medication. Certain medical conditions and substances also can cause emotional highs and lows that resemble bipolar disorder (secondary bipolar disorder).  TREATMENT  Bipolar disorder is a long-term  illness. It is best controlled with continuous treatment rather than treatment only when symptoms occur. The following treatments can be prescribed for bipolar disorders: Medication--Medication can be prescribed by a doctor that is  an expert in treating mental disorders (psychiatrists). Medications called mood stabilizers are usually prescribed to help control the illness. Other medications are sometimes added if symptoms of mania, depression, or psychotic delusions and hallucinations occur despite the use of a mood stabilizer. Talk therapy--Some forms of talk therapy are helpful in providing support, education, and guidance. A combination of medication and talk therapy is best for managing the disorder over time. A procedure in which electricity is applied to your brain through your scalp (electroconvulsive therapy) is used in cases of severe mania when medication and talk therapy do not work or work too slowly. Document Released: 07/16/2000 Document Revised: 08/04/2012 Document Reviewed: 05/05/2012 [facility] Patient Information 2015 [place], [place]. This information is not intended to replace advice given to you by your health care provider. Make sure you discuss any questions you have with your health care provider.   Emergency Department Resource Guide 1) Find a Doctor and Pay Out of Pocket Although you won't have to find out who is covered by your insurance plan, it is a good idea to ask around and get recommendations. You will then need to call the office and see if the doctor you have chosen will accept you as a new patient and what types of options they offer for patients who are self-pay. Some doctors offer discounts or will set up payment plans for their patients who do not have insurance, but you will need to ask so you aren't surprised when you get to your appointment.  2) Contact Your Local Health Department Not all health departments have doctors that can see patients for sick visits, but  many do, so it is worth a call to see if yours does. If you don't know where your local health department is, you can check in your phone book. The CDC also has a tool to help you locate your state's health department, and many state websites also have listings of all of their local health departments.  3) Find a Walk-in Clinic If your illness is not likely to be very severe or complicated, you may want to try a walk in clinic. These are popping up all over the country in pharmacies, drugstores, and shopping centers. They're usually staffed by nurse practitioners or physician assistants that have been trained to treat common illnesses and complaints. They're usually fairly quick and inexpensive. However, if you have serious medical issues or chronic medical problems, these are probably not your best option.  No Primary Care Doctor: - Call Health Connect at  [phone] - they can help you locate a primary care doctor that  accepts your insurance, provides certain services, etc. - Physician Referral Service- [phone]  Chronic Pain Problems: Organization         Address  Phone   Notes  [facility]  [phone] Patients need to be referred by their primary care doctor.   Medication Assistance: Organization         Address  Phone   Notes  [facility] [address] [place], [place] [id number] [phone] --Must be a resident of [facility] [place] [place] [place] -- Must have NO insurance coverage whatsoever (no Medicaid/ Medicare, etc.) -- The pt. MUST have a primary care doctor that directs their care regularly and follows them in the community   [facility]ist  [phone]   [employer]  [phone]    Agencies that provide inexpensive medical care: Organization  Address  Phone   Notes  [facility]  [phone]   [facility]    [phone]   [facility] [address] [place], [place] [address] [phone]   [facility] [address] [place] [phone]   Planned Parenthood    [phone]   [facility]    [phone]   [facility]  [address], [place] Phone:  [phone], Fax:  [phone] Hours of Operation:  9 am - 6 pm, M-F.  Also accepts Medicaid/Medicare and self-pay.  [facility]  [address], [place] Phone: [phone], Fax: [phone]. Hours of Operation:  8:30 am - 5:30 pm, M-F.  Also accepts Medicaid and self-pay.  [facility] [place] [address], [place] Phone: [phone]   [facility] [address] Natasha Bence [place], [place] [phone], Ext. [phone] Mondays & Thursdays: 7-9 AM.  First 15 patients are seen on a first come, first serve basis.    Medicaid-accepting [facility] Providers:  Organization         Address  Phone   Notes  [facility] [address], [place] [phone] Also accepts self-pay patients.  [facility] [address] Laurell Josephs [place], [place]  [phone]   [facility] [address], [place] [phone]   [facility] [address] [place] [phone]   Renaye Rakers [address], [place]   [phone] Only accepts [place] patients after they have their name applied to their card.   Self-Pay (no insurance) in [place] [facility]:  Organization         Address  Phone   Notes  Sickle Cell Patients, [facility] Internal Medicine [address] [place], [place] [phone]   [facility] [address] [place], [place] [phone]   [facility] [place]  [address], [place] [phone]   [facility]/Dr. Osei-Bonsu  [address] [place] or  [address], [place] [phone] Phone number for both [place] and [place] locations is the same.  [facility] [address] [place] [phone]   [facility] [address], [place] or [address] [phone]   [facility] [address] [place] [phone], phone; [phone], fax Sees patients 1st and 3rd Saturday of every month.  Must not qualify for public or private insurance (i.e. Medicaid, Medicare, [place] Health Choice, Veterans' Benefits)  Household income should be no more than 200% of the poverty level The clinic cannot treat you if you are pregnant or think you are pregnant  Sexually transmitted diseases are not treated at the clinic.    Dental Care: Organization         Address  Phone  Notes  [facility] of [facility] [address] [place], [place] [phone] Accepts children up to age 37 who are enrolled in [place] or [place] Health Choice; pregnant women with a Medicaid card; and children who have applied for Medicaid or  Health Choice, but were declined, whose parents can pay a reduced fee at time  of service.  [facility] Department of [facility]  [address], [place] [phone] Accepts children up to age 57 who are enrolled in [place] or [place] Health Choice; pregnant women with a Medicaid card; and children who have applied for Medicaid or [place] Health Choice, but were declined, whose parents can pay a reduced fee at time of service.  [facility]  [address] [place], [place] [phone] Patients are seen by appointment only. Walk-ins are not accepted. [facility] will see patients 67 years of age and older. Monday - Tuesday (8am-5pm) Most Wednesdays (8:30-5pm) $30 per visit, cash only  [facility]  [address], [facility] [phone] Patients are seen by appointment only. Walk-ins are not accepted. [facility] will see patients 71 years of age and older. One Wednesday Evening (Monthly: Volunteer Based).  $30 per visit, cash only  [employer]  [phone] for adults; Children under age 26, call Graduate Pediatric Dentistry at [phone]. Children aged 48-14, please call [phone] to request a pediatric application.  Dental services are provided in all areas of dental care including fillings, crowns and bridges, complete and partial dentures, implants, gum treatment, root canals, and extractions. Preventive care is also provided. Treatment is provided to both adults and children. Patients are selected via a lottery and there is often a waiting list.   [facility] [address], [place]  [phone] [email or web address]   [facility] [address] [place], [place] [phone], Ext. [phone] Second and Fourth Thursday of each month, opens at 6:30 AM; Clinic ends at 9 AM.  Patients are seen on a first-come first-served basis, and a limited number are seen during each clinic.   [facility]  [address] Ether Griffins [place], [place] [phone]   Eligibility Requirements You must have lived in [place], [place], or [place] counties for at least the last three months.   You cannot be eligible for state or federal sponsored [place], including [employer], [place], or [employer].   You generally cannot be eligible for healthcare insurance through your employer.    How to apply: Eligibility screenings are held every Tuesday and Wednesday afternoon from 1:00 pm until 4:00 pm. You do not need an appointment for the interview!  [facility] [address] [place], [place] [phone]   [facility] Health Department  [phone]   [facility] Department  [phone]   [facility]  Health Department  [phone]    Behavioral Health Resources in the Community: Intensive Outpatient Programs Organization         Address  Phone  Notes  [facility] [address] [place], [place] [phone]   [facility] Outpatient [address], [place], [place] [phone]   [facility] [address] [place], [place]  [phone]   [facility] Mental Health [address],  [place], [place] [phone] or [phone]   Substance Abuse Resources Organization         Address  Phone  Notes  [facility]  [phone]   [facility]  [phone]   [facility]  [phone]   Floydene Flock  [phone]   Residential & Outpatient Substance Abuse Program  [phone]   Psychological Services Organization         Address  Phone  Notes  [employer] [facility]  [phone]   [facility] Services  [phone]   [facility] Mental Health [address] [place] [phone] or [phone]    Mobile Crisis Teams Organization         Address  Phone  Notes  [facility], Mobile Crisis Care Unit  [phone]   [facility]  [address] [place], [place] [phone]   Doristine Locks [address] [place] [phone]    Self-Help/Support Groups Organization         Address  Phone             Notes  [facility] [place] - variety of support groups  336- I7437963 Call for more information  Narcotics Anonymous (NA), [facility] [address], [employer] [place]  2 meetings at this location   Statistician         Address  Phone  Notes  [facility] [address],    [place]  [phone]   [facility]  [address] [place] [address], [place], [place] [phone]   [facility] Treatment Facility [address] [place], [place] [phone]  Admissions: 8am-3pm M-F  [facility] [address],    [place], [place] [phone]   [facility] [address] [place], [place], [place] [phone]   [facility] [address],  [place], [place] [phone]   [facility] - Intensive Outpatient [address] Laurell Josephs [address], [place], [place] [phone]   [facility] ([facility] [address] [place]  [place], [place] [phone] or [phone]   [facility] ([facility]) [address] [place], [place] [phone] Accepts Medicaid  [facility] [address]  [place] [phone] Substance Abuse/Addiction Treatment   [facility] Organization         Address  Phone  Notes  [facility]  [phone]   Angie Fava, PhD [address] Ervin Knack [place], [place]   [phone] or [phone]   [facility]al   [address] [place], [place] [phone]   [facility] [address], [place], [place] [phone] Insurance/Medicaid/sponsorship through [facility] [address]                                    [place], [place] [phone] Therapy/tele-psych/case  [facility] [address], [place] [phone]    Dr. Lolly Mustache  [phone]   [facility]  [employer] [facility] Dept. 1) [address]  2) [address], [place] 3)  [address], [place] [phone]  [phone]   [facility] Child Abuse Hotline (437)395-4458 or [phone] (After Hours)        Emergency Department Resource Guide 1) Find a Doctor and Pay Out of Pocket Although you won't have to find out who is covered by your insurance plan, it is a good idea to ask around and get recommendations. You will then need to call the office and see if the doctor you have chosen will accept you as a  new patient and what types of options they offer for patients who are self-pay. Some doctors offer discounts or  will set up payment plans for their patients who do not have insurance, but you will need to ask so you aren't surprised when you get to your appointment.  2) Contact Your Local Health Department Not all health departments have doctors that can see patients for sick visits, but many do, so it is worth a call to see if yours does. If you don't know where your local health department is, you can check in your phone book. The CDC also has a tool to help you locate your state's health department, and many state websites also have listings of all of their local health departments.  3) Find a Walk-in Clinic If your illness is not likely to be very severe or complicated, you may want to try a walk in clinic. These are popping up all over the country in pharmacies, drugstores, and shopping centers. They're usually staffed by nurse practitioners or physician assistants that have been trained to treat common illnesses and complaints. They're usually fairly quick and inexpensive. However, if you have serious medical issues or chronic medical problems, these are probably not your best option.  No Primary Care Doctor: - Call Health Connect at  [phone] - they can help you locate a primary care doctor that  accepts your insurance, provides certain services, etc. - Physician Referral Service- [phone]  Chronic Pain Problems: Organization         Address  Phone   Notes  [facility]  [phone] Patients need to be referred by their primary care doctor.   Medication Assistance: Organization         Address  Phone   Notes  [facility] Medication [facility] [address] [place], [place] [address] [phone] --Must be a resident of [facility] -- Must have NO insurance coverage whatsoever (no Medicaid/ Medicare, etc.) -- The pt. MUST have a  primary care doctor that directs their care regularly and follows them in the community   Med[facility]ist  [phone]   [employer]  [phone]    Agencies that provide inexpensive medical care: Organization         Address  Phone   Notes  [facility]  [phone]   [facility]    [phone]   [facility] [address] [place], [place] [address] [phone]   [facility] [address] [place], [place] [phone]   Planned Parenthood    [phone]   [facility]    [phone]   [facility]  [address], [place] Phone:  [phone], Fax:  [phone] Hours of Operation:  9 am - 6 pm, M-F.  Also accepts Medicaid/Medicare and self-pay.  [facility]  [address], [place] Phone: [phone], Fax: [phone]. Hours of Operation:  8:30 am - 5:30 pm, M-F.  Also accepts Medicaid and self-pay.  [facility] [place] [address], [place] Phone: [phone]   [facility] [address] Natasha Bence [place], [place] [phone], Ext. [phone] Mondays & Thursdays: 7-9 AM.  First 15 patients are seen on a first come, first serve basis.    Medicaid-accepting [facility] Providers:  Organization         Address  Phone   Notes  [facility] [address], [place] [phone]  Also accepts self-pay patients.  [facility] [address] Laurell Josephs [place], [place]  [phone]   [facility] [address], [place] [phone]   [facility] [address], [place] [phone]   Renaye Rakers [address], [place]   [phone] Only accepts [place] patients after they have their name applied to their card.   Self-Pay (no insurance) in [facility]:  Organization         Address  Phone   Notes  Sickle Cell Patients, [facility] [address] [place], [place] [phone]   [facility] [address] [place], [place] [phone]   [facility] [place]  [address],  [phone]   [facility]/Dr. Osei-Bonsu  [address] [place] or [address], [place] [phone] Phone number for both [place] and [place] locations is the same.  [facility] [address], [place] [phone]   [facility] [address] [place] or [address] [phone]   [facility] [address] [place] [phone], phone; [phone], fax Sees patients 1st and 3rd Saturday of every month.  Must not qualify for public or private insurance (i.e. Medicaid, Medicare, [place] Health Choice, Veterans' Benefits)  Household income should be no more than 200% of the poverty level The clinic cannot treat you if you are pregnant or think you are pregnant  Sexually transmitted diseases are not treated at the clinic.   Dental Care: Organization         Address  Phone  Notes  [facility] Department of [facility] [address] [place], [place] [phone] Accepts children up to age 35 who are enrolled in [place] or [place] Health Choice; pregnant women with a Medicaid card; and children who have applied for Medicaid or [place] Health Choice, but were declined, whose parents can pay a reduced fee at time of service.  [facility] Department of [facility]  [address], [place] [phone] Accepts children up to age 59 who are enrolled in [place] or [place] Health Choice; pregnant women with a Medicaid card; and children who have applied for Medicaid or [place] Health Choice, but were declined, whose parents can pay  a reduced fee at time of service.  [facility]  [address] [place], [place] [phone] Patients are seen by appointment only. Walk-ins are not accepted. [facility] will see patients 16 years of age and older. Monday - Tuesday (8am-5pm) Most Wednesdays (8:30-5pm) $30 per visit, cash only  [facility]  [address], [facility] [phone] Patients are seen by appointment only. Walk-ins are not accepted. [facility] will see patients 21 years of age and older. One Wednesday Evening (Monthly: Volunteer Based).  $30 per visit, cash only  [employer]  [phone] for adults; Children under age 58, call Graduate Pediatric Dentistry at [phone]. Children aged 83-14, please call [phone] to request a pediatric application.  Dental services are provided in all areas of dental care including fillings, crowns and bridges, complete and partial dentures, implants, gum treatment, root canals, and extractions. Preventive care is also provided. Treatment is  provided to both adults and children. Patients are selected via a lottery and there is often a waiting list.   [facility] [address], [place]  [phone] [email or web address]   [facility] [address], [place], [place] [phone], Ext. [phone] Second and Fourth Thursday of each month, opens at 6:30 AM; Clinic ends at 9 AM.  Patients are seen on a first-come first-served basis, and a limited number are seen during each clinic.   [facility]  [address], [place], [place] [phone]   Eligibility Requirements You must have lived in [place], [place], or [place] counties for at least the last three months.   You cannot be eligible for state or federal sponsored National Cityhealthcare insurance, including CIG[employer]Veterans Administration, [place]icaid, or [employer]icare.   You generally cannot be eligible for healthcare  insurance through your employer.    How to apply: Eligibility screenings are held every Tuesday and Wednesday afternoon from 1:00 pm until 4:00 pm. You do not need an appointment for the interview!  [facility] [address], [place], [place] [phone]   [facility]  [phone]   [facility]  [phone]   [facility]  [phone]    Behavioral Health Resources in the Community: Intensive Outpatient Programs Organization         Address  Phone  Notes  [facility] [address], [place], [place] [phone]   [facility] [address], [place], [place] [phone]   [facility] [address], [place], [place]  [phone]   [facility] [address],  [place], [place] [phone] or [phone]   Substance Abuse Resources Organization         Address  Phone  Notes  [facility]  [phone]   [facility]  [phone]   [facility]  [phone]   [facility]  [phone]   Residential & Outpatient Substance Abuse Program  [phone]   Psychological Services Organization         Address  Phone  Notes  [facility]  [phone]- [phone]   [facility]  [phone]- [phone]   [facility] [address], [place] [phone] or [phone]    Mobile Crisis Teams Organization         Address  Phone  Notes  [facility], Mobile Crisis Care Unit  [phone]  [facility]  [address] [place], [place] [phone]  Doristine LocksSharon DeEsch [address] [place] [phone]   Self-Help/Support Groups Organization         Address  Phone             Notes  [facility] [place] - variety of support groups  336- [phone] Call for more information  Narcotics Anonymous (NA),  [facility] [address], [place]  2 meetings at this location   Statisticianesidential Treatment Programs Organization         Address  Phone  Notes  [facility] [address],    [place]  [phone]   [facility]  [address], [place] [phone], [place], [place] [phone]   [facility] [address], [place] [phone] Admissions: 8am-3pm M-F  [facility] [address]    [place], [place] [phone]  [facility] [address] Starling Manns [place], [place] [phone]   [facility] [address],  [place], [place] [phone]   [facility] - Intensive Outpatient [address]ure[address], [place], [place] [phone]   [facility] (Addiction Recovery [facility] [address]  [place], [place] [phone] or [phone]   [facility] (RTS) [address] [place], [place] [phone] Accepts Medicaid  [facility] [address],  [place] [phone] Substance Abuse/Addiction Treatment   [facility] Organization         Address  Phone  Notes  [facility]  [phone]   Angie Fava, PhD [address] Ervin Knack [place], [place]   [phone] or [phone]   [facility]   [address] [place], [place] [phone]   [facility] [address], [place], [place] [phone] Insurance/Medicaid/sponsorship through [facility] and Families [address]                                    [place], [place] [phone] Therapy/tele-psych/case  [facility] [address], [place] [phone]    Dr. Lolly Mustache  [phone]   [facility]  [employer] [facility] Dept. 1) [address] [place] 2) [address] [place] 3)  [address], [place] [phone]  [phone]    [facility] Child Abuse Hotline 940-408-7751 or [phone] (After Hours)
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
## 2014-03-26 NOTE — ED Notes (Signed)
Social worker at bedside speaking with patient.

## 2014-03-26 NOTE — ED Provider Notes (Addendum)
No issues overnight.  Pt is waiting to speak to social worker this am.  Will need group home placement.  Filed Vitals:   03/26/14 0601  BP: 121/82  Pulse: 81  Temp:   Resp: 16     Linwood DibblesJon Shanautica Forker, MD 03/26/14 [phone]  Pt has been evaluated by social work.  They have started the process to help him get into a group home again.  This process can take several days and only occurs during the work week.  Pt does not have any acute medical or psychiatric issues that requires staying in the ED.  He does not need inpatient hospitalization.  Pt will be discharged with medications.   A bus pass will be provided to the patient.    Linwood DibblesJon Segundo Makela, MD 03/26/14 [phone]

## 2014-03-26 NOTE — BH Assessment (Signed)
Spoke with Jinny SandersJoseph Mintz PA-C who reports pt is medically cleared with numbers improving or about the same as his baseline. Discussed pt most likely being psychiatrically cleared but needing assistance with finding a new group home. Social work consult could be helpful in helping pt establish housing and reduce repeated ED visits.   Relayed results of assessment to PepsiCoeil Mashburn, PA-C. Per Lloyd HugerNeil pt does not meet inpt criteria, she agrees pt would be good candidate for social work consult.   Informed RN that pt is psychiatrically cleared and recommendations for social work involvement. RN will confer with ED provider regarding plan to have pt meet with social work in AM or potential discharge as he has been cleared medically and psychiatrically.    Clista BernhardtNancy Hedda Crumbley, [facility] Triage Specialist 03/26/2014 1:37 AM

## 2014-05-15 ENCOUNTER — Emergency Department: Payer: Self-pay | Admitting: Internal Medicine

## 2014-05-15 LAB — URINALYSIS, COMPLETE
BLOOD: NEGATIVE
Bacteria: NONE SEEN
Bilirubin,UR: NEGATIVE
GLUCOSE, UR: NEGATIVE mg/dL (ref 0–75)
Ketone: NEGATIVE
Leukocyte Esterase: NEGATIVE
NITRITE: NEGATIVE
Ph: 7 (ref 4.5–8.0)
Protein: NEGATIVE
RBC,UR: 1 /HPF (ref 0–5)
Specific Gravity: 1.017 (ref 1.003–1.030)
Squamous Epithelial: 1
WBC UR: 1 /HPF (ref 0–5)

## 2014-05-15 LAB — COMPREHENSIVE METABOLIC PANEL
ANION GAP: 5 — AB (ref 7–16)
Albumin: 3.6 g/dL (ref 3.4–5.0)
Alkaline Phosphatase: 53 U/L
BILIRUBIN TOTAL: 0.2 mg/dL (ref 0.2–1.0)
BUN: 12 mg/dL (ref 7–18)
CHLORIDE: 106 mmol/L (ref 98–107)
CREATININE: 0.91 mg/dL (ref 0.60–1.30)
Calcium, Total: 8.9 mg/dL (ref 8.5–10.1)
Co2: 30 mmol/L (ref 21–32)
EGFR (Non-African Amer.): >60
Glucose: 83 mg/dL (ref 65–99)
OSMOLALITY: 280 (ref 275–301)
Potassium: 4.1 mmol/L (ref 3.5–5.1)
SGOT(AST): 27 U/L (ref 15–37)
SGPT (ALT): 18 U/L
SODIUM: 141 mmol/L (ref 136–145)
Total Protein: 7.5 g/dL (ref 6.4–8.2)

## 2014-05-15 LAB — DRUG SCREEN, URINE
Amphetamines, Ur Screen: NEGATIVE (ref ?–1000)
BARBITURATES, UR SCREEN: NEGATIVE (ref ?–200)
Benzodiazepine, Ur Scrn: NEGATIVE (ref ?–200)
CANNABINOID 50 NG, UR ~~LOC~~: NEGATIVE (ref ?–50)
COCAINE METABOLITE, UR ~~LOC~~: NEGATIVE (ref ?–300)
MDMA (Ecstasy)Ur Screen: NEGATIVE (ref ?–500)
METHADONE, UR SCREEN: NEGATIVE (ref ?–300)
OPIATE, UR SCREEN: NEGATIVE (ref ?–300)
Phencyclidine (PCP) Ur S: NEGATIVE (ref ?–25)
Tricyclic, Ur Screen: NEGATIVE (ref ?–1000)

## 2014-05-15 LAB — CBC
HCT: 43.1 % (ref 40.0–52.0)
HGB: 13.5 g/dL (ref 13.0–18.0)
MCH: 29.4 pg (ref 26.0–34.0)
MCHC: 31.3 g/dL — AB (ref 32.0–36.0)
MCV: 94 fL (ref 80–100)
PLATELETS: 178 10*3/uL (ref 150–440)
RBC: 4.57 10*6/uL (ref 4.40–5.90)
RDW: 14.4 % (ref 11.5–14.5)
WBC: 4.8 10*3/uL (ref 3.8–10.6)

## 2014-05-15 LAB — ETHANOL: Ethanol: <3 mg/dL

## 2014-05-15 LAB — SALICYLATE LEVEL: Salicylates, Serum: <1.7 mg/dL

## 2014-05-15 LAB — ACETAMINOPHEN LEVEL

## 2014-05-20 ENCOUNTER — Emergency Department: Payer: Self-pay | Admitting: Student

## 2014-05-20 LAB — COMPREHENSIVE METABOLIC PANEL
ALBUMIN: 3.7 g/dL (ref 3.4–5.0)
ALK PHOS: 58 U/L (ref 46–116)
ANION GAP: 4 — AB (ref 7–16)
BILIRUBIN TOTAL: 0.2 mg/dL (ref 0.2–1.0)
BUN: 11 mg/dL (ref 7–18)
CREATININE: 0.9 mg/dL (ref 0.60–1.30)
Calcium, Total: 8.9 mg/dL (ref 8.5–10.1)
Chloride: 106 mmol/L (ref 98–107)
Co2: 32 mmol/L (ref 21–32)
EGFR (African American): >60
Glucose: 96 mg/dL (ref 65–99)
Osmolality: 282 (ref 275–301)
Potassium: 4.2 mmol/L (ref 3.5–5.1)
SGOT(AST): 30 U/L (ref 15–37)
SGPT (ALT): 16 U/L (ref 14–63)
Sodium: 142 mmol/L (ref 136–145)
Total Protein: 7.5 g/dL (ref 6.4–8.2)

## 2014-05-20 LAB — CBC
HCT: 43.5 % (ref 40.0–52.0)
HGB: 13.9 g/dL (ref 13.0–18.0)
MCH: 29.7 pg (ref 26.0–34.0)
MCHC: 31.9 g/dL — AB (ref 32.0–36.0)
MCV: 93 fL (ref 80–100)
Platelet: 166 10*3/uL (ref 150–440)
RBC: 4.67 10*6/uL (ref 4.40–5.90)
RDW: 14 % (ref 11.5–14.5)
WBC: 4.7 10*3/uL (ref 3.8–10.6)

## 2014-05-20 LAB — ACETAMINOPHEN LEVEL: Acetaminophen: <2 ug/mL

## 2014-05-20 LAB — ETHANOL: Ethanol: <3 mg/dL

## 2014-05-20 LAB — SALICYLATE LEVEL

## 2014-06-03 ENCOUNTER — Emergency Department: Payer: Self-pay | Admitting: Student

## 2014-06-07 ENCOUNTER — Emergency Department: Payer: Self-pay | Admitting: Emergency Medicine

## 2014-06-15 ENCOUNTER — Inpatient Hospital Stay: Payer: Self-pay | Admitting: Psychiatry

## 2014-07-15 ENCOUNTER — Emergency Department: Payer: Self-pay | Admitting: Emergency Medicine

## 2014-07-15 LAB — CBC
HCT: 41.5 %
HGB: 13.4 g/dL
MCH: 29.2 pg
MCHC: 32.3 g/dL
MCV: 90 fL
Platelet: 229 x10 3/mm 3
RBC: 4.59 x10 6/mm 3
RDW: 13 %
WBC: 5.7 x10 3/mm 3

## 2014-07-15 LAB — COMPREHENSIVE METABOLIC PANEL WITH GFR
Albumin: 4 g/dL
Alkaline Phosphatase: 46 U/L
Anion Gap: 8
BUN: 13 mg/dL
Bilirubin,Total: 0.4 mg/dL
Calcium, Total: 9.8 mg/dL
Chloride: 111 mmol/L
Co2: 30 mmol/L
Creatinine: 0.77 mg/dL
EGFR (African American): >60
EGFR (Non-African Amer.): >60
Glucose: 124 mg/dL — ABNORMAL HIGH
Potassium: 2.9 mmol/L — ABNORMAL LOW
SGOT(AST): 22 U/L
SGPT (ALT): 13 U/L — ABNORMAL LOW
Sodium: 149 mmol/L — ABNORMAL HIGH
Total Protein: 7.1 g/dL

## 2014-07-16 ENCOUNTER — Emergency Department: Payer: Self-pay | Admitting: Emergency Medicine

## 2014-07-16 LAB — DRUG SCREEN, URINE
AMPHETAMINES, UR SCREEN: NEGATIVE
Amphetamines, Ur Screen: NEGATIVE
BENZODIAZEPINE, UR SCRN: NEGATIVE
Barbiturates, Ur Screen: NEGATIVE
Barbiturates, Ur Screen: NEGATIVE
Benzodiazepine, Ur Scrn: NEGATIVE
CANNABINOID 50 NG, UR ~~LOC~~: NEGATIVE
Cannabinoid 50 Ng, Ur ~~LOC~~: NEGATIVE
Cocaine Metabolite,Ur ~~LOC~~: NEGATIVE
Cocaine Metabolite,Ur ~~LOC~~: NEGATIVE
MDMA (ECSTASY) UR SCREEN: NEGATIVE
MDMA (Ecstasy)Ur Screen: NEGATIVE
METHADONE, UR SCREEN: NEGATIVE
Methadone, Ur Screen: NEGATIVE
Opiate, Ur Screen: NEGATIVE
Opiate, Ur Screen: NEGATIVE
PHENCYCLIDINE (PCP) UR S: NEGATIVE
Phencyclidine (PCP) Ur S: NEGATIVE
TRICYCLIC, UR SCREEN: NEGATIVE
Tricyclic, Ur Screen: NEGATIVE

## 2014-07-16 LAB — URINALYSIS, COMPLETE
BACTERIA: NONE SEEN
BLOOD: NEGATIVE
Bacteria: NONE SEEN
Bilirubin,UR: NEGATIVE
Bilirubin,UR: NEGATIVE
Blood: NEGATIVE
GLUCOSE, UR: NEGATIVE mg/dL (ref 0–75)
Glucose,UR: NEGATIVE mg/dL (ref 0–75)
LEUKOCYTE ESTERASE: NEGATIVE
Leukocyte Esterase: NEGATIVE
NITRITE: NEGATIVE
Nitrite: NEGATIVE
PH: 7 (ref 4.5–8.0)
Ph: 6 (ref 4.5–8.0)
Protein: 30
RBC,UR: 2 /HPF (ref 0–5)
RBC,UR: NONE SEEN /HPF (ref 0–5)
Specific Gravity: 1.033 (ref 1.003–1.030)
Specific Gravity: 1.03 (ref 1.003–1.030)
Squamous Epithelial: NONE SEEN
Squamous Epithelial: 1
WBC UR: 1 /HPF (ref 0–5)

## 2014-07-16 LAB — COMPREHENSIVE METABOLIC PANEL
ALBUMIN: 4 g/dL
ANION GAP: 6 — AB (ref 7–16)
AST: 22 U/L
Alkaline Phosphatase: 47 U/L
BILIRUBIN TOTAL: 0.2 mg/dL — AB
BUN: 12 mg/dL
Calcium, Total: 9.7 mg/dL
Chloride: 109 mmol/L
Co2: 32 mmol/L
Creatinine: 0.85 mg/dL
Glucose: 102 mg/dL — ABNORMAL HIGH
Potassium: 3.3 mmol/L — ABNORMAL LOW
SGPT (ALT): 13 U/L — ABNORMAL LOW
Sodium: 147 mmol/L — ABNORMAL HIGH
TOTAL PROTEIN: 7.1 g/dL

## 2014-07-16 LAB — ACETAMINOPHEN LEVEL

## 2014-07-16 LAB — CBC
HCT: 42.9 % (ref 40.0–52.0)
HGB: 13.9 g/dL (ref 13.0–18.0)
MCH: 29.3 pg (ref 26.0–34.0)
MCHC: 32.5 g/dL (ref 32.0–36.0)
MCV: 90 fL (ref 80–100)
Platelet: 225 10*3/uL (ref 150–440)
RBC: 4.75 10*6/uL (ref 4.40–5.90)
RDW: 13 % (ref 11.5–14.5)
WBC: 4.4 10*3/uL (ref 3.8–10.6)

## 2014-07-16 LAB — ETHANOL: Ethanol: <5 mg/dL

## 2014-07-17 LAB — LITHIUM LEVEL: Lithium: <0.06 mmol/L (ref 0.60–1.20)

## 2014-07-17 LAB — DIFFERENTIAL
Basophil #: 0 10*3/uL (ref 0.0–0.1)
Basophil %: 0.2 %
EOS PCT: 2.1 %
Eosinophil #: 0.1 10*3/uL (ref 0.0–0.7)
LYMPHS PCT: 55.2 %
Lymphocyte #: 2.4 10*3/uL (ref 1.0–3.6)
Monocyte #: 0.4 x10 3/mm (ref 0.2–1.0)
Monocyte %: 10.2 %
NEUTROS PCT: 32.3 %
Neutrophil #: 1.4 10*3/uL (ref 1.4–6.5)

## 2014-07-20 ENCOUNTER — Emergency Department
Admit: 2014-07-20 | Disposition: A | Discharge status: Other Acute Inpatient Hospital | Payer: Self-pay | Admitting: Emergency Medicine

## 2014-07-21 LAB — URINALYSIS, COMPLETE
BACTERIA: NONE SEEN
BILIRUBIN, UR: NEGATIVE
BLOOD: NEGATIVE
GLUCOSE, UR: NEGATIVE mg/dL (ref 0–75)
Ketone: NEGATIVE
LEUKOCYTE ESTERASE: NEGATIVE
Nitrite: NEGATIVE
Ph: 8 (ref 4.5–8.0)
Protein: NEGATIVE
RBC,UR: 1 /HPF (ref 0–5)
SQUAMOUS EPITHELIAL: NONE SEEN
Specific Gravity: 1.015 (ref 1.003–1.030)
WBC UR: <1 /HPF (ref 0–5)

## 2014-07-21 LAB — DIFFERENTIAL
BASOS ABS: 0 10*3/uL (ref 0.0–0.1)
BASOS PCT: 0.3 %
Eosinophil #: 0.1 10*3/uL (ref 0.0–0.7)
Eosinophil %: 1.4 %
LYMPHS PCT: 27.1 %
Lymphocyte #: 2.3 10*3/uL (ref 1.0–3.6)
MONOS PCT: 14 %
Monocyte #: 1.2 x10 3/mm — ABNORMAL HIGH (ref 0.2–1.0)
NEUTROS ABS: 4.9 10*3/uL (ref 1.4–6.5)
Neutrophil %: 57.2 %

## 2014-07-21 LAB — CBC
HCT: 42.9 % (ref 40.0–52.0)
HGB: 13.5 g/dL (ref 13.0–18.0)
MCH: 28.5 pg (ref 26.0–34.0)
MCHC: 31.4 g/dL — ABNORMAL LOW (ref 32.0–36.0)
MCV: 91 fL (ref 80–100)
Platelet: 174 10*3/uL (ref 150–440)
RBC: 4.73 10*6/uL (ref 4.40–5.90)
RDW: 13 % (ref 11.5–14.5)
WBC: 8.6 10*3/uL (ref 3.8–10.6)

## 2014-07-21 LAB — COMPREHENSIVE METABOLIC PANEL
ALK PHOS: 52 U/L
ALT: 21 U/L
Albumin: 4.3 g/dL
Anion Gap: 5 — ABNORMAL LOW (ref 7–16)
BILIRUBIN TOTAL: 0.4 mg/dL
BUN: 10 mg/dL
CHLORIDE: 111 mmol/L
CREATININE: 0.88 mg/dL
Calcium, Total: 9.5 mg/dL
Co2: 29 mmol/L
EGFR (African American): >60
EGFR (Non-African Amer.): >60
GLUCOSE: 110 mg/dL — AB
POTASSIUM: 3.4 mmol/L — AB
SGOT(AST): 35 U/L
Sodium: 145 mmol/L
Total Protein: 7.4 g/dL

## 2014-07-21 LAB — ETHANOL

## 2014-07-21 LAB — DRUG SCREEN, URINE
Amphetamines, Ur Screen: NEGATIVE
Barbiturates, Ur Screen: NEGATIVE
Benzodiazepine, Ur Scrn: NEGATIVE
CANNABINOID 50 NG, UR ~~LOC~~: NEGATIVE
COCAINE METABOLITE, UR ~~LOC~~: NEGATIVE
MDMA (Ecstasy)Ur Screen: NEGATIVE
METHADONE, UR SCREEN: NEGATIVE
Opiate, Ur Screen: NEGATIVE
PHENCYCLIDINE (PCP) UR S: NEGATIVE
Tricyclic, Ur Screen: NEGATIVE

## 2014-07-21 LAB — SALICYLATE LEVEL: Salicylates, Serum: <4 mg/dL

## 2014-07-21 LAB — ACETAMINOPHEN LEVEL: Acetaminophen: <10 ug/mL

## 2014-07-30 ENCOUNTER — Emergency Department
Admit: 2014-07-30 | Disposition: A | Discharge status: Home / Self Care | Payer: Self-pay | Admitting: Emergency Medicine

## 2014-07-31 LAB — COMPREHENSIVE METABOLIC PANEL
ALBUMIN: 4.2 g/dL
ALK PHOS: 53 U/L
ANION GAP: 5 — AB (ref 7–16)
BUN: 9 mg/dL
Bilirubin,Total: 0.2 mg/dL — ABNORMAL LOW
CALCIUM: 9.4 mg/dL
CHLORIDE: 107 mmol/L
CREATININE: 0.92 mg/dL
Co2: 27 mmol/L
EGFR (African American): >60
EGFR (Non-African Amer.): >60
Glucose: 116 mg/dL — ABNORMAL HIGH
POTASSIUM: 3.1 mmol/L — AB
SGOT(AST): 24 U/L
SGPT (ALT): 18 U/L
Sodium: 139 mmol/L
TOTAL PROTEIN: 7.1 g/dL

## 2014-07-31 LAB — URINALYSIS, COMPLETE
Bacteria: NONE SEEN
Bilirubin,UR: NEGATIVE
Blood: NEGATIVE
Glucose,UR: NEGATIVE mg/dL (ref 0–75)
Leukocyte Esterase: NEGATIVE
Nitrite: NEGATIVE
PH: 6 (ref 4.5–8.0)
Specific Gravity: 1.026 (ref 1.003–1.030)

## 2014-07-31 LAB — CBC
HCT: 41.6 % (ref 40.0–52.0)
HGB: 13.3 g/dL (ref 13.0–18.0)
MCH: 29 pg (ref 26.0–34.0)
MCHC: 32.1 g/dL (ref 32.0–36.0)
MCV: 90 fL (ref 80–100)
Platelet: 331 10*3/uL (ref 150–440)
RBC: 4.61 10*6/uL (ref 4.40–5.90)
RDW: 12.5 % (ref 11.5–14.5)
WBC: 8.8 10*3/uL (ref 3.8–10.6)

## 2014-07-31 LAB — DRUG SCREEN, URINE
AMPHETAMINES, UR SCREEN: NEGATIVE
BARBITURATES, UR SCREEN: NEGATIVE
BENZODIAZEPINE, UR SCRN: POSITIVE
COCAINE METABOLITE, UR ~~LOC~~: NEGATIVE
Cannabinoid 50 Ng, Ur ~~LOC~~: NEGATIVE
MDMA (Ecstasy)Ur Screen: NEGATIVE
Methadone, Ur Screen: NEGATIVE
Opiate, Ur Screen: NEGATIVE
PHENCYCLIDINE (PCP) UR S: NEGATIVE
TRICYCLIC, UR SCREEN: NEGATIVE

## 2014-07-31 LAB — DIFFERENTIAL
BASOS ABS: 0 10*3/uL (ref 0.0–0.1)
BASOS PCT: 0.4 %
Eosinophil #: 0.2 10*3/uL (ref 0.0–0.7)
Eosinophil %: 1.9 %
LYMPHS ABS: 2.9 10*3/uL (ref 1.0–3.6)
LYMPHS PCT: 32.5 %
MONO ABS: 0.9 x10 3/mm (ref 0.2–1.0)
Monocyte %: 10.3 %
NEUTROS ABS: 4.8 10*3/uL (ref 1.4–6.5)
Neutrophil %: 54.9 %

## 2014-07-31 LAB — ETHANOL: Ethanol: <5 mg/dL

## 2014-07-31 LAB — TSH: Thyroid Stimulating Horm: 2.441 u[IU]/mL

## 2014-07-31 LAB — SALICYLATE LEVEL

## 2014-07-31 LAB — ACETAMINOPHEN LEVEL

## 2014-08-07 LAB — DIFFERENTIAL
BASOS ABS: 0 10*3/uL (ref 0.0–0.1)
Basophil %: 0.5 %
Eosinophil #: 0.4 10*3/uL (ref 0.0–0.7)
Eosinophil %: 5.2 %
Lymphocyte #: 1.7 10*3/uL (ref 1.0–3.6)
Lymphocyte %: 25 %
MONOS PCT: 6.4 %
Monocyte #: 0.4 x10 3/mm (ref 0.2–1.0)
Neutrophil #: 4.4 10*3/uL (ref 1.4–6.5)
Neutrophil %: 62.9 %

## 2014-08-07 LAB — WBC: WBC: 6.8 10*3/uL (ref 3.8–10.6)

## 2014-08-13 NOTE — Consult Note (Signed)
Details:   - Psychiatry: Patient seen. He is agitated and argumentative. Loud. Very difficult to talk with because he can't understand things very well due to chronic cognitive impairment. Patient is already on several mood stabilizers and antipsychotics with only slight benifit. He is awaiting [facility] transfer. He is untreatable on our unit. Tried to do some education with him. Continue meds and also gave one time geodon im shot due to his acute agitation.   Electronic Signatures: Laportia Carley, Jackquline DenmarkJohn T (MD)  (Signed 16-Apr-14 15:49)  Authored: Details   Last Updated: 16-Apr-14 15:49 by Audery Amellapacs, Petrita Blunck T (MD)

## 2014-08-13 NOTE — Consult Note (Signed)
PATIENT NAME:  [name], [name] MR#:  [id number] DATE OF BIRTH:  Sep 14, 1985  DATE OF CONSULTATION:  08/01/2012  CONSULTING PHYSICIAN:  Audery AmelJohn T. Rand Etchison, MD  IDENTIFYING INFORMATION AND REASON FOR CONSULT: A 29 year old man with a history of mental retardation and chronic behavioral disturbance, possibly schizoaffective disorder, brought into the hospital after becoming violent, aggressive and oppositional at his most recent group home. Consult for assistance with psychiatric treatment and evaluation.   HISTORY OF PRESENT ILLNESS: Information obtained from the patient, the chart and from my previous knowledge of the patient. The patient says that he feels like he was being treated badly, disrespected and abused at his group home. He is quite angry and disorganized about it. Referral paperwork states that he was aggressive, threatening, possibly threatening violence to other residence at his group home. The patient says that he has been taking his medicine. He denies that he has been abusing substances.   PAST PSYCHIATRIC HISTORY: The patient has a long history of behavioral disturbance. Borderline intellectual functioning, not clear if he has been fully diagnosed with mental retardation. He has been aggressive and violent in the past. Has multiple group homes in the area and gotten thrown out of all of them in short order because of his behavior problems. He was on our unit for a fairly extended stay recently under similar circumstances and despite aggressive treatment, remained unmanageable and had to be isolated for much of his stay. It is unclear whether he is having real psychotic symptoms. He is very immature and impulsive. His current medication includes Depakote, lithium and Zyprexa, all of which seems to just barely help.   PAST MEDICAL HISTORY: History of chronic allergies, otherwise no significant ongoing medical problems.   SOCIAL HISTORY: The patient does not evidently have a guardian, but is  clearly chronically disabled. Family are not involved in his care. His mother lives in a group home somewhere. The [facility] had [facility] after having lived in [place] previously. When he was discharged, he was [place] [facility] and in short order has been thrown out of multiple group homes the most recent one being typical.   SUBSTANCE ABUSE HISTORY: The patient denies that he abuses alcohol or drugs on a regular basis.   CURRENT MEDICATIONS: Depakote 500 mg 3 times a day, Cogentin 1 mg twice a day, lithium 300 mg twice a day, Zyprexa 10 mg twice a day, Klonopin 0.5 mg twice a day, hydroxyzine 50 mg every 6 hours as he needed for anxiety.   ALLERGIES: BENADRYL.   REVIEW OF SYSTEMS: Complains of being angry and feeling like hurting someone. Says he has been having auditory hallucinations. No specific physical symptoms.   MENTAL STATUS EXAMINATION:  An agitated young man who looks his stated age. Passively cooperative, but often argumentative. Eye contact intermittent. Psychomotor activity fidgety and pacing. Speech loud at times. Affect irritable and labile mood, stated as being upset. Thoughts disorganized, not grossly bizarre. No obvious psychotic statements, but frequently off topic. Endorses hallucinations. Denies current suicidal or homicidal ideation. Short-and long term memory impaired. Judgment and insight poor.   LABORATORY RESULTS: Acetaminophen undetectable. Chemistry shows an elevated sodium 146, potassium low at 3.4, no particular significance. Alcohol undetected. Hematocrit low at 39, hemoglobin low at 12.5. TSH elevated at 8.14, drug screen negative. Urinalysis unremarkable.   ASSESSMENT: A 29 year old man with a history of borderline intellectual functioning and schizoaffective disorder who once again has been thrown out of  the group home for his behavior problems and his back in the Emergency Room. Based on his previous behavior, he is  not appropriate for admission to our unit, needs referral to longer-term psychiatric treatment.   TREATMENT PLAN: I restarted all of medicines, but increase the lithium to 3 times a day and increase the Zyprexa to 10 mg 3 times a day. Referral is made to [facility]. The patient's needs will be taking care of in the Emergency Room while we await transfer.   DIAGNOSIS, PRINCIPAL AND PRIMARY:   AXIS I: Schizoaffective disorder, bipolar type.   SECONDARY DIAGNOSES: AXIS I: No further.   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   AXIS III: No diagnosis.   AXIS IV: Moderate to severe from lack of any real support.   AXIS V: Functioning at time of evaluation 25.    ____________________________ Audery Amel, MD jtc:cc D: 08/01/2012 16:42:27 ET T: 08/01/2012 17:34:35 ET JOB#: [id number]  cc: Audery Amel, MD, <Dictator> Audery Amel MD ELECTRONICALLY SIGNED 08/03/2012 11:28

## 2014-08-13 NOTE — Consult Note (Signed)
Psychiatry: Patient continues to be in the emergency room because of his agitated aggressive disruptive behavior which has made it impossible for him to be managed in the community. We continue to await a anticipated [facility] if we cannot find a group home that would be willing to take him.  Electronic Signatures: Ulonda Klosowski, Jackquline DenmarkJohn T (MD)  (Signed on 18-Apr-14 17:08)  Authored  Last Updated: 18-Apr-14 17:08 by Audery Amellapacs, Katharyn Schauer T (MD)

## 2014-08-13 NOTE — Consult Note (Signed)
Brief Consult Note: Diagnosis: Bipolar affective disorder, MR.   Patient was seen by consultant.   Consult note dictated.   Recommend further assessment or treatment.   Orders entered.   Discussed with Attending MD.   Comments: [name] has a h/o depression and MR. He was just [facility] stay [facility]. He was placed in a new group home. He became unrully there. He was brought to the ER and is not allowed to return to the same group home.   PLAN: 1. The patient is to continue all medications as prescribed at discharge from [facility].  2. We are trying to find him a new place. Tami LinLisa [facility] from [facility] is comming to see him tomorrow.   3. Psychiatry will follow up..  Electronic Signatures: Kristine LineaPucilowska, Jolanta (MD)  (Signed 07-Mar-14 17:30)  Authored: Brief Consult Note   Last Updated: 07-Mar-14 17:30 by Kristine LineaPucilowska, Jolanta (MD)

## 2014-08-13 NOTE — Consult Note (Signed)
Brief Consult Note: Diagnosis: Bipolar affective disorder, MR.   Patient was seen by consultant.   Recommend further assessment or treatment.   Orders entered.   Comments: Pt seen this am. He appeared very hyper and loud and was singing in the hallways. He has poor insight about the reasons for his readmission and is unable to control himself.Remains loud and difficult to redirect.   Has poor insight about his illnes.he denied having SI/HI or plans.   PLAN: Pt is IVC  Awaiting placement at Group home or [facility]  Continue medications as follows: Will titrate Zyprexa 15mg  po TID.  Titrate Klonopin 1mg  po TID Add Cogentin 1mg  po BID prn for EPS.  Titrate Depakote ER 500mg  po TID  Psychiatry will follow up.  Electronic Signatures: Rhunette CroftFaheem, Arria Naim S (MD)  (Signed 18-Mar-14 21:40)  Authored: Brief Consult Note   Last Updated: 18-Mar-14 21:40 by Rhunette CroftFaheem, Chimamanda Siegfried S (MD)

## 2014-08-13 NOTE — Consult Note (Signed)
Brief Consult Note: Diagnosis: bipolar disorder mixed.   Patient was seen by consultant.   Consult note dictated.   Recommend further assessment or treatment.   Orders entered.   Comments: Psychiatry: Patient seen. 29 year old with history of chronic mental illness and behavior problems. Failed multiple group home placements. Currently calm. Not threatening. Admit to psychiatry pending closer evaluation of pt needs.  Electronic Signatures: Audery Amellapacs, Amali Uhls T (MD)  (Signed 19-Mar-14 20:16)  Authored: Brief Consult Note   Last Updated: 19-Mar-14 20:16 by Audery Amellapacs, Zuhair Lariccia T (MD)

## 2014-08-13 NOTE — Consult Note (Signed)
Brief Consult Note: Diagnosis: Bipolar affective disorder, MR.   Patient was seen by consultant.   Recommend further assessment or treatment.   Orders entered.   Discussed with Attending MD.   Comments: Pt seen this am. He appeared very hyper and loud and was  agitated and was unable to control himsefl.He was noted to be dancing in the hallway in ED BHU. He reported that he is feeling happy as Mr Rudell CobbKent has told him about the probable discharge and he is looking forward to the same Has poor insight about his illnes.he denied having SI/HI or plans.   PLAN: 1. Will adjust the medications as follows: Will titrate Zyprexa 15mg  po TID.  Titrate Klonopin 1mg  po TID Add Cogentin 1mg  po BID prn for EPS.  Titrate Depakote ER 500mg  po TID   We are trying to find him a new place.  [facility].  Psychiatry will follow up.  Electronic Signatures: Rhunette CroftFaheem, Uzma S (MD)  (Signed 13-Mar-14 12:16)  Authored: Brief Consult Note   Last Updated: 13-Mar-14 12:16 by Rhunette CroftFaheem, Uzma S (MD)

## 2014-08-13 NOTE — Consult Note (Signed)
Presenting Symptoms:  Presenting Symptoms Anger/irritability  Mood Disturbance   History of Present Illness:  History of Present Illness Pt is a 29 yo AAM just discharged from [facility] on 06/24/12 and was send to the Group Home, presented again after having a conflict there as well. He was recently d/c from [facility] after spending 3 months in hospital, brought from his group home as he was becoming agressive over there. According to the report, he was threatening the staff, walking in the traffic as he trying to get hit by the car.  During my interview, pt was very hyper and had rambling speech. He  stated that he was upset as one of the other client charged him with stealing things as h did not do it, he was upset and trying to walk outside. he wants to go back to the old group home. He stated that he uses his medications as prescribed and he is not experiencing any side effects. he appeared very hyper, anxious with rambling speech. Has poor insight about his illness.he denied SI/HI or plans.   Target Symptoms:  Manic Pressured Speech  Aggression   Psychosis Disorganization   Behavior Aggression   Arousal/Cognitive Inattention  Decreased Concentration   Past Psychiatric Treatment: First Treatment: Long h/o mental illness with mulltiple hospitalizations in the past.   Previous Hospitalizations: recent discharge from [facility].   Current Outpatient Treatment: Zyprexa, Depakote, Vistaril, Lithium, Cogentin.  Substance Abuse- Alcohol: The patient denies any use of alcohol..  Substance Abuse- Cocaine: The patient denies any current use of cocaine or history of cocaine use..  Substance Abuse- Opiates: The patient denies any current abuse of opiates or history of opiate abuse..  Substance Abuse- Tobacco Use: Tobacco Use: No.  PAST MEDICAL & SURGICAL HX:  Significant Events:   Bipolar Disorder:    Denies surgical history.:   CURRENT OUTPATIENT MEDICATIONS:  Home Medications: Medication  Instructions Status  Depakote 500 mg oral delayed release tablet 1 tab(s) orally 2 times a day Active  lithium 600 mg oral capsule 1 cap(s) orally 2 times a day Active  Zyprexa 15 mg oral tablet 1 tab(s) orally 2 times a day Active  Vistaril pamoate 50 mg oral capsule 1 cap(s) orally every 6 hours, As Needed- for Agitation  Active  Cogentin 1 milligram(s) orally 2 times a day, As Needed for EPS Active  Zyprexa 5 mg oral tablet 1 tab(s) orally every 4 hours, As Needed- for Agitation  Active  magnesium hydroxide 8% oral suspension 30 milliliter(s) orally every 72 hours, As Needed- for Constipation  Active  Tylenol 325 mg oral tablet 2 tab(s) (650 mg) orally every 6 hours, As Needed- for Pain  Active  Deep Sea Nasal Spray 0.65% nasal spray 1 spray(s) nasal 2 times a day, As Needed for congestion Active   Social History: Lives in Group home after his d/c from [facility].  Mental Status Exam:  Mental Status Exam Large built male who appeared his stated age. He was hyper and has loud speech   Speech Fluent   Mood Anxious   Affect Irritable   Thought Processes Disorganized   Orientation Self  Place  Time   Attention Alert  Awake   Concentration Fair   Memory Intact   Fund of Knowledge Fair   Language Fair   Judgement Poor   Insight Poor   Reliabiity Fair   Suicide Risk Assessment: Suicide Risk Level No risk inidicated.  NURSING FLOWSHEETS:  Vital Signs/Nurse Notes-CM: ED Vital Sign Flow Sheet:     06-Mar-14 08:56  Temp Temperature 98.8  Temp Source oral  Pulse Pulse 75  Respirations Respirations 20  SBP SBP 165  DBP DBP 89  Pulse Ox % Pulse Ox % 100  Pulse Ox Source Source Room Air  Pain Scale (0-10) Pain Scale (0-10) Scale:0   LAB:  Laboratory Results: Thyroid:    05-Mar-14 19:49, Thyroid Stimulating Hormone  Thyroid Stimulating Hormone 7.21  0.45-4.50  (International Unit)   -----------------------  Pregnant patients have   different reference   ranges for  TSH:   - - - - - - - - - -   Pregnant, first trimetser:   0.36 - 2.50 uIU/mL  Hepatic:    05-Mar-14 19:49, Comprehensive Metabolic Panel  Bilirubin, Total 0.2  Alkaline Phosphatase 60  SGPT (ALT) 27  SGOT (AST) 27  Total Protein, Serum 7.3  Albumin, Serum 3.8  TDMs:    05-Mar-14 19:49, Lithium, Serum  Lithium, Serum 0.36  0.60-1.20  TOXIC >= 1.50 mmol/L    05-Mar-14 19:49, Valproic Acid, Serum  Valproic Acid, Serum 55  50-100  POTENTIALLY TOXIC:   > 200 mcg/mL  Routine Chem:    05-Mar-14 19:49, Comprehensive Metabolic Panel  Glucose, Serum 83  BUN 9  Creatinine (comp) 0.84  Sodium, Serum 148  Potassium, Serum 3.7  Chloride, Serum 113  CO2, Serum 30  Calcium (Total), Serum 8.5  Osmolality (calc) 292  eGFR (African American) >60  eGFR (Non-African American) >60  eGFR values <60mL/min/1.73 m2 may be an indication of chronic  kidney disease (CKD).  Calculated eGFR is useful in patients with stable renal function.  The eGFR calculation will not be reliable in acutely ill patients  when serum creatinine is changing rapidly. It is not useful in   patients on dialysis. The eGFR calculation may not be applicable  to patients at the low and high extremes of body sizes, pregnant  women, and vegetarians.  Anion Gap 5    05-Mar-14 19:49, [name], Serum  [name], S. < 3  [name] % (comp) < 0.003  Result(s) reported on 25 Jun 2012 at 08:28PM.  Urine Drugs:    05-Mar-14 20:01, Urine Drug Screen, Qual  Tricyclic Antidepressant, Ur Qual (comp) NEGATIVE  Result(s) reported on 25 Jun 2012 at 08:55PM.  Amphetamines, Urine Qual. NEGATIVE  MDMA, Urine Qual. NEGATIVE  Cocaine Metabolite, Urine Qual. NEGATIVE  Opiate, Urine qual NEGATIVE  Phencyclidine, Urine Qual. NEGATIVE  Cannabinoid, Urine Qual. NEGATIVE  Barbiturates, Urine Qual. NEGATIVE  Benzodiazepine, Urine Qual. NEGATIVE  -----------------  The URINE DRUG SCREEN provides only a preliminary, unconfirmed  analytical test  result and should not be used for non-medical   purposes.  Clinical consideration and professional judgment should be   applied to any positive drug screen result due to possible  interfering substances.  A more specific alternate chemical method  must be used in order to obtain a confirmed analytical result.  Gas  chromatography/mass spectrometry (GC/MS) is the preferred  confirmatory method.  Methadone, Urine Qual. NEGATIVE  Routine UA:    05-Mar-14 20:01, Urinalysis  Color (UA) Yellow  Clarity (UA) Hazy  Glucose (UA) Negative  Bilirubin (UA) Negative  Ketones (UA) Negative  Specific Gravity (UA) 1.020  Blood (UA) Negative  pH (UA) 6.0  Protein (UA) Negative  Nitrite (UA) Negative  Leukocyte Esterase (UA) Negative  Result(s) reported on 25 Jun 2012 at 08:31PM.  RBC (UA) <1 /HPF  WBC (UA) 1 /HPF  Bacteria (UA)   NONE SEEN  Epithelial Cells (UA) <1 /  HPF  Mucous (UA) PRESENT  Result(s) reported on 25 Jun 2012 at 08:31PM.  Routine Hem:    05-Mar-14 19:49, Hemogram, Platelet Count  WBC (CBC) 7.8  RBC (CBC) 4.52  Hemoglobin (CBC) 13.5  Hematocrit (CBC) 41.6  Platelet Count (CBC) 254  Result(s) reported on 25 Jun 2012 at 08:10PM.  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   Axis II: Borderline Intellectual Functioning.   Axis IV: Problems with primary support group  Problems related to social environment .  Treatment Plan: Patient is aware of and understands the risks and benefits of the proposed treatment or treatment changes..   Patient understands the risks and benefits of the alternative treatments..   Patient understands the risks and benefits of doing nothing..   Pt will be monitored closely for the worsening for his symptoms.He might be admitted to BH Unit for stabilizations and safety.  Discussed with pt about his placement and he is working [facility] about going to group home. No  change in meds at this time. Will restart his meds.  Follow up as  needed.  Electronic Signatures: Faheem, Uzma S (MD)  (Signed 06-Mar-14 12:52)  Authored: Presenting Symptoms, History of Present Illness, Target Symptoms, Past Psychiatric Treatment, Substance Abuse History, PAST MEDICAL & SURGICAL HX, CURRENT OUTPATIENT MEDICATIONS, Social History, Mental Status Exam, Suicide Risk Assessment, NURSING FLOWSHEETS, LAB, Assessment & Diagnosis, Treatment Plan   Last Updated: 06-Mar-14 12:52 by Faheem, Uzma S (MD) 

## 2014-08-13 NOTE — Consult Note (Signed)
Presenting Symptoms:  Presenting Symptoms Anger/irritability  Mood Disturbance   History of Present Illness:  History of Present Illness Pt is a 29 yo AAM just discharged from [facility] after spending 3 months in hospital, brought from his group home as he was becoming agressive over there. According to the report, he was threatening the staff, walking and talking to the walls.  During my interview, pt was calm. he stated that he was upset as one of the other client was constantly using the bathroom and he was not getting any chance to get in there. He was knocking on the door and they asked him about the same. he was getting upset so the staff asked him to calm dowm. However, his legs were shaking and he was lud. They called the police who brought him here. Now he does not want to go back to the same place. He feels that his current meds are helping him and he is not experiencing any side effects He denied SI/HI or plans.   Target Symptoms:  Manic Pressured Speech  Aggression   Psychosis Disorganization   Behavior Aggression   Past Psychiatric Treatment: [facility]: Long h/o mental illness with mulltiple hospitalizations in the past.   Previous Hospitalizations: recent discharge from [facility].  PAST MEDICAL & SURGICAL HX:  Significant Events:   Bipolar Disorder:    Denies surgical history.:   CURRENT OUTPATIENT MEDICATIONS:  Home Medications: Medication Instructions Status  Depakote 500 mg oral delayed release tablet 1 tab(s) orally 2 times a day Active  lithium 600 mg oral capsule 1 cap(s) orally 2 times a day Active  Zyprexa 15 mg oral tablet 1 tab(s) orally 2 times a day Active  Vistaril pamoate 50 mg oral capsule 1 cap(s) orally every 6 hours, As Needed- for Agitation  Active  Cogentin 1 milligram(s) orally 2 times a day, As Needed for EPS Active  Zyprexa 5 mg oral tablet 1 tab(s) orally every 4 hours, As Needed- for Agitation  Active  magnesium hydroxide 8% oral suspension 30  milliliter(s) orally every 72 hours, As Needed- for Constipation  Active  Tylenol 325 mg oral tablet 2 tab(s) (650 mg) orally every 6 hours, As Needed- for Pain  Active  Deep Sea Nasal Spray 0.65% nasal spray 1 spray(s) nasal 2 times a day, As Needed for congestion Active   Social History: Lives in Group home.  Mental Status Exam:  Mental Status Exam Large built male who appeared his stated age.   Speech Fluent   Mood Anxious   Affect Irritable   Thought Processes Disorganized   Orientation Self  Place  Time   Attention Alert  Awake   Concentration Fair   Memory Intact   Fund of Knowledge Fair   Language Fair   Judgement Fair   Insight Fair   Reliabiity Fair   Suicide Risk Assessment: Suicide Risk Level No risk inidicated.  NURSING FLOWSHEETS:  Vital Signs/Nurse Notes-CM: ED Vital Sign Flow Sheet:   04-Mar-14 07:41  Temp Temperature 97.7  Temp Source oral  Pulse Pulse 64  Respirations Respirations 20  SBP SBP 130  DBP DBP 67  Pulse Ox % Pulse Ox % 99  Pulse Ox Source Source Room Air   LAB:  Laboratory Results: Thyroid:    03-Mar-14 19:28, Thyroid Stimulating Hormone  Thyroid Stimulating Hormone 3.48  0.45-4.50  (International Unit)   -----------------------  Pregnant patients have   different reference   ranges for TSH:   - - - - - - - - - -  Pregnant, first trimetser:   0.36 - 2.50 uIU/mL  Hepatic:    03-Mar-14 19:28, Comprehensive Metabolic Panel  Bilirubin, Total 0.3  Alkaline Phosphatase 61  SGPT (ALT) 24  SGOT (AST) 30  Total Protein, Serum 7.3  Albumin, Serum 3.8  Routine Chem:  Glucose, Serum 88  BUN 9  Creatinine (comp) 0.87  Sodium, Serum 148  Potassium, Serum 3.5  Chloride, Serum 113  CO2, Serum 30  Calcium (Total), Serum 9.1  Osmolality (calc) 292  eGFR (African American) >60  eGFR (Non-African American) >60  eGFR values <62m/min/1.73 m2 may be an indication of chronic  kidney disease (CKD).  Calculated eGFR is  useful in patients with stable renal function.  The eGFR calculation will not be reliable in acutely ill patients  when serum creatinine is changing rapidly. It is not useful in   patients on dialysis. The eGFR calculation may not be applicable  to patients at the low and high extremes of body sizes, pregnant  women, and vegetarians.  Anion Gap 5    03-Mar-14 19:28, [name], Serum  [name], S. < 3  [name] % (comp) < 0.003  Result(s) reported on 23 Jun 2012 at 08:22PM.  Urine Drugs:    03-Mar-14 19:28, Urine Drug Screen, Qual  Tricyclic Antidepressant, Ur Qual (comp) NEGATIVE  Result(s) reported on 23 Jun 2012 at 08:04PM.  Amphetamines, Urine Qual. NEGATIVE  MDMA, Urine Qual. NEGATIVE  Cocaine Metabolite, Urine Qual. NEGATIVE  Opiate, Urine qual NEGATIVE  Phencyclidine, Urine Qual. NEGATIVE  Cannabinoid, Urine Qual. NEGATIVE  Barbiturates, Urine Qual. NEGATIVE  Benzodiazepine, Urine Qual. NEGATIVE  -----------------  The URINE DRUG SCREEN provides only a preliminary, unconfirmed  analytical test result and should not be used for non-medical   purposes.  Clinical consideration and professional judgment should be   applied to any positive drug screen result due to possible  interfering substances.  A more specific alternate chemical method  must be used in order to obtain a confirmed analytical result.  Gas  chromatography/mass spectrometry (GC/MS) is the preferred  confirmatory method.  Methadone, Urine Qual. NEGATIVE  Routine UA:    03-Mar-14 19:28, Urinalysis  Color (UA) Yellow  Clarity (UA) Clear  Glucose (UA) Negative  Bilirubin (UA) Negative  Ketones (UA) Negative  Specific Gravity (UA) 1.012  Blood (UA) Negative  pH (UA) 8.0  Protein (UA) Negative  Nitrite (UA) Negative  Leukocyte Esterase (UA) Negative  Result(s) reported on 23 Jun 2012 at 07:59PM.  RBC (UA) 1 /HPF  WBC (UA) 1 /HPF  Bacteria (UA) TRACE  Epithelial Cells (UA)   NONE SEEN  Mucous (UA) PRESENT   Result(s) reported on 23 Jun 2012 at 07:59PM.  Routine Hem:    03-Mar-14 19:28, Hemogram, Platelet Count  WBC (CBC) 7.6  RBC (CBC) 4.42  Hemoglobin (CBC) 12.8  Hematocrit (CBC) 40.5  Platelet Count (CBC) 242  Result(s) reported on 23 Jun 2012 at 07:50PM.  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   Axis II: Borderline Intellectual Functioning.   Axis IV: Problems with primary support group  Problems related to social environment .  Treatment Plan: Discussed with pt about his placement and he is working [place] going to another group home. he acknowledeged the same.  No change in meds as he appeared to be responding to them at this time.  Will restart his meds.  Follow up as scheduled.  Electronic Signatures: FJeronimo Norma(  MD)  (Signed 04-Mar-14 17:15)  Authored: Presenting Symptoms, History of Present Illness, Target Symptoms, Past Psychiatric Treatment, PAST MEDICAL & SURGICAL HX, CURRENT OUTPATIENT MEDICATIONS, Social History, Mental Status Exam, Suicide Risk Assessment, NURSING FLOWSHEETS, LAB, Assessment & Diagnosis, Treatment Plan   Last Updated: 04-Mar-14 17:15 by Jeronimo Norma (MD)

## 2014-08-13 NOTE — Consult Note (Signed)
Details:   - Psychiatry: Followup for this young man with schizoaffective disorder and mild mental retardation. Patient continues to be agitated with poor insight but has not been violent and is not attempting to hurt himself. Continues to have behavior inconsistent with being able to take care of himself safely in the community. Patient has failed multiple attempts at appropriate mental health group home placement. At this point requires longer-term hospital treatment and we are awaiting [facility] while continuing current medication.   Electronic Signatures: Audery Amellapacs, [name] (MD)  (Signed 12-Apr-14 16:47)  Authored: Details   Last Updated: 12-Apr-14 16:47 by Audery Amellapacs, [name] (MD)

## 2014-08-13 NOTE — H&P (Signed)
PATIENT NAME:  Riley LamyBROADIE, Kalil MR#:  [id number] DATE OF BIRTH:  November 02, 1985  DATE OF ADMISSION: 07/09/2012.   IDENTIFYING INFORMATION AND CHIEF COMPLAINT: A 29 year old man with a past history of psychiatric problems brought to the Emergency Room after becoming hostile at his group home.   CHIEF COMPLAINT: "They were disrespecting me."   HISTORY OF PRESENT ILLNESS: Information obtained from the patient and the chart. The patient is a limited historian at best. The patient is relatively new to our area and we have little in the way of outside information to go one. He apparently he just recently moved into the group home where he is staying and became dissatisfied and irritable over some sort of issue. He was hostile, loud and disruptive and they brought him here to the hospital. The patient cannot give a much clearer description of it and that except say that he thinks that they are taking him to group homes that are not appropriate for him. He denies that he is having hallucinations. He denies having any suicidal or homicidal ideation. He says that his mood is okay right now. He says that he has been compliant with his medication.   PAST PSYCHIATRIC HISTORY: Details are limited. He apparently has had psychiatric problems and treatment in the system for many years. He tells me that in the past, he has been told that he has "schizophrenia, bipolar, ADHD, MR." He cannot tell me anything specific about which medicines have been more helpful for him and we do not have much collateral information to go on right now. He denies any history of suicide attempts, admits that he has been aggressive in the past. He denies that he has a substance abuse problem. He cannot tell me, which medicines have been most helpful for him. Not sure whether he has had previous hospitalizations.   PAST MEDICAL HISTORY: As far as we know, no particular identified medical problems right now.   SOCIAL HISTORY: Sounds like the patient  has been raised by foster or adoptive parents and has been in group homes for some time. All the details are unclear. Right now he has managed to get himself thrown out of 3 group homes in a row with his behavior.   SUBSTANCE ABUSE HISTORY: Denies that he drinks or uses drugs or has any history of substance abuse problems.   REVIEW OF SYSTEMS: The patient denies suicidal or homicidal ideation. Denies that he is having hallucinations. Denies feeling paranoid. No specific physical complaints currently.   MEDICATIONS:  According to the records that we have, his most recent medicines have been lithium 600 mg twice a day, Vistaril p.r.n. 50 mg q. 6 as needed, Cogentin 1 mg twice a day as needed, Zyprexa 15 mg 3 times a day, Klonopin 1 mg 3 times a day, Depakote 500 mg 3 times a day, Zyprexa p.r.n., as well as magnesium as needed for constipation and Tylenol and nasal spray.   ALLERGIES: BENADRYL.   MENTAL STATUS EXAMINATION:  Adequately groomed for the circumstances, age, young man who looks his stated age. Passively cooperative. Does not give me much information. He spends most of the time eating and chewing. Poor eye contact. Slow psychomotor activity. Speech is dysarthric and slurred much of the time, difficult to understand. Affect is blunted. Mood is stated as being okay. Thoughts hard to assess. No obvious delusions. Denies auditory or visual hallucinations. Denies suicidal or homicidal ideation. Judgment and insight appear to be poor. Did not do full cognitive testing,  apparently has been diagnosed as developmentally disabled to some degree in the past.   PHYSICAL EXAMINATION: GENERAL: The patient appears to be in no acute physical distress.  SKIN: No skin lesions identified.  HEENT: Pupils equal and reactive. Face symmetric.  NECK AND BACK: Nontender.  MUSCULOSKELETAL: Full range of motion in all extremities. Gait within normal limits. NEUROLOGIC:  Cranial nerves symmetric. Strength and reflexes  normal and symmetric throughout.  LUNGS: Clear without wheezing.  HEART: Regular rate and rhythm.  ABDOMEN: Soft, nontender, normal bowel sounds.  VITAL SIGNS: Temperature 97.5, pulse 80, respirations 18, blood pressure 139/72.   LABORATORY RESULTS: Admission labs show a negative drug screen, valproic acid level 81. TSH elevated at 5.2, alcohol not detectable. Chemistry panel: No significant abnormalities. Lithium level was 0.99. Hematology normal.   ASSESSMENT: A 29 year old man with what appears to be chronic combination of disabilities, hard to determine right now whether it would be best categorized as a bipolar disorder, psychotic disorder, developmental disorder or some element of attention deficit/hyperactivity disorder. In any case, having repeated behavior problems with aggression and disruptive behavior making it impossible for him to stay placed right now.   TREATMENT PLAN: Admit to psychiatry. Continue medications roughly as prescribed, although I am going to cut down a little bit on the Zyprexa dose because he was sedated very badly all day today. Monitor behavior on the unit. Try and get as much as collateral information from past treatment as possible. Adjust medicines as needed. Work on discharge planning.   DIAGNOSIS, PRINCIPAL AND PRIMARY:   AXIS I: Bipolar disorder, type I, mixed.   SECONDARY DIAGNOSES: AXIS I: Rule out attention deficit/hyperactivity disorder.   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   AXIS III: No diagnosis.   AXIS IV: Severe from homelessness.   AXIS V: Functioning at time of evaluation 30.     ____________________________ Audery Amel, MD jtc:cc D: 07/09/2012 20:24:11 ET T: 07/09/2012 20:37:42 ET JOB#: [id number]  cc: Audery Amel, MD, <Dictator> Audery Amel MD ELECTRONICALLY SIGNED 07/11/2012 16:34

## 2014-08-13 NOTE — Consult Note (Signed)
Brief Consult Note: Diagnosis: schizoaffective disorder/MR.   Patient was seen by consultant.   Consult note dictated.   Orders entered.   Comments: Psychiatry: Patient seen. Known to me from prior treatment. Patient With MR and schizoaffective disorder/chronic behavior problems. Brought in to hosp after becoming agressive at group home. Can't return there. Too aggressive for our hospital. [facility]. Continue all meds.  Electronic Signatures: Audery Amellapacs, Anisia Leija T (MD)  (Signed 11-Apr-14 16:34)  Authored: Brief Consult Note   Last Updated: 11-Apr-14 16:34 by Audery Amellapacs, Ahsan Esterline T (MD)

## 2014-08-13 NOTE — Discharge Summary (Signed)
PATIENT NAME:  [name], [name] MR#:  [id number] DATE OF BIRTH:  06-08-1985  DATE OF ADMISSION:  07/09/2012 DATE OF DISCHARGE:  07/21/2012  HOSPITAL COURSE: See dictated history and physical for details of admission. This 29 year old man who has a history of essentially lifelong mental health problems, variously diagnosed as bipolar, schizoaffective, attention deficit/hyperactivity disorder and developmental disability was admitted through the Emergency Room after he presented having been put out of his most recent group home. The patient had a history of both recent and long-standing of getting himself thrown out of every residence that he is in because of his disruptive behavior. It does not appear that he actually is violent towards anyone, but he gets loud easily and can appear hostile at times and he is not able to calm down or control himself. Because of the symptoms and the recent history of multiple failures, he was admitted to psychiatry to attempt treatment  and stabilization. On the ward, he was treated with a combination of lithium and Depakote as he had been previously. The patient also is on standing dose of clonazepam as well as Zyprexa. Despite his medications, he continued to be verbally disruptive throughout his hospital stay. He was not actually violent or aggressive and it did not hurt himself, but he was frequently getting himself worked up verbally whether it was because he was angry about something or because he was happy about something at which point he would usually start shouting and making a lot of noise. Attempts to redirect him off and were hindered by the fact that any sort of confrontational style tended to make him worse. We worked with him on trying to get him to monitor his own behavior with only limited success. We did get some old records from [facility] where they had a more complete history and workup. It is indicated that attempts to treat him with stimulants in  the past had been counterproductive and those attempts to treat him (Dictation Anomaly) <<with guanfacine>> in the past seemed to have simply been ineffective. Therefore, we did not start any new medicine for attention deficit/hyperactivity disorder. Eventually we did find an appropriate group home that was willing to give him a try to. The patient was quite willing to go. At the time of discharge, he was not exhibiting any psychotic symptoms. He was not hostile, not threatening and showed improved insight.   MENTAL STATUS EXAM AT DISCHARGE: Large young man who looks his stated age or younger. Good eye contact. Psychomotor activity fidgety almost constantly. Speech is often loud not exactly pressured, but once he gets going he does talk quite a bit. Affect is overall euthymic. He is not agitated, not threatening, does not appear to be euphoric. Mood is stated as good. Thoughts are scattered, no obvious bizarre or delusional content, but he skips around from topic to topic. Denies hallucinations. Denied suicidal or homicidal ideation. Seems to frequently be cognitively impaired with difficulty holding on to ideas for very long. There is some evidence in the  old chart that he actually had more of a developmental disability, although it appears that he probably never qualified for full criteria of mental retardation.   DISCHARGE MEDICATIONS: Lithium 300 mg twice a day, Klonopin 0.5 mg 3 times a day, sodium chloride nasal spray p.r.n., Zyprexa 10 mg twice a day, Depakote 500 mg 3 times a day, olanzapine 5 mg q. 4-6 hours p.r.n. agitation,  Vistaril 50 mg every 6 hours p.r.n. agitation, Cogentin 1 mg  twice a day.   LABORATORY RESULTS: Admission labs showed a drug screen that was negative. Valproic acid level 8,1 which was repeated later and remained and remained constant at 80. Lithium level 0.99. Urinalysis unremarkable. TSH slightly elevated at 5.2. Alcohol undetected. Chemistry panel shows elevated chloride and  elevated glucose. CBC normal.   DISPOSITION: He is discharged to a local group home and will follow up with [facility].   DIAGNOSIS, PRINCIPAL AND PRIMARY:   AXIS I: Schizoaffective disorder, bipolar type.   SECONDARY DIAGNOSES: AXIS I: Attention deficit/hyperactivity disorder by history.   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   AXIS III: Obesity.   AXIS IV: Severe stress from frequent dislocation and lack of support from any family.   AXIS V: Functioning at time of discharge is 55.     ____________________________ Audery AmelJohn T. Eileen Croswell, MD jtc:cc D: 07/21/2012 17:32:29 ET T: 07/21/2012 23:19:09 ET JOB#: [id number]  cc: Audery AmelJohn T. Tiran Sauseda, MD, <Dictator> Audery AmelJOHN T Kevontae Burgoon MD ELECTRONICALLY SIGNED 07/22/2012 0:22

## 2014-08-13 NOTE — Consult Note (Signed)
Brief Consult Note: Diagnosis: Bipolar affective disorder, MR.   Patient was seen by consultant.   Recommend further assessment or treatment.   Orders entered.   Discussed with Attending MD.   Comments: [name] has a h/o mood symptoms and MR. [facility]. He continues to be very loud and hyper. He  was placed in a new group home but became unrully there. He was brought to the ER and is not allowed to return to the same group home. He stated that he has racing thoughts and he has to express his emotions. he denied having SI/HI or plans.   PLAN: 1. The patient is to continue all medications as prescribed at discharge [facility]. Will titrate Zyprexa 15mg  po TID.  Add Klonopin 0.5 mg po BID Add Cogentin 1mg  po BID prn for EPS.   We are trying to find him a new place. [facility] is comming to see him  Psychiatry will follow up.  Electronic Signatures: Rhunette CroftFaheem, Uzma S (MD)  (Signed 11-Mar-14 10:09)  Authored: Brief Consult Note   Last Updated: 11-Mar-14 10:09 by Rhunette CroftFaheem, Uzma S (MD)

## 2014-08-14 LAB — WBC: WBC: 7.3 10*3/uL (ref 3.8–10.6)

## 2014-08-14 LAB — DIFFERENTIAL
Basophil #: 0 10*3/uL (ref 0.0–0.1)
Basophil %: 0.5 %
EOS ABS: 0.4 10*3/uL (ref 0.0–0.7)
Eosinophil %: 6.1 %
LYMPHS PCT: 24.8 %
Lymphocyte #: 1.8 10*3/uL (ref 1.0–3.6)
Monocyte #: 1.1 x10 3/mm — ABNORMAL HIGH (ref 0.2–1.0)
Monocyte %: 14.7 %
NEUTROS ABS: 3.9 10*3/uL (ref 1.4–6.5)
Neutrophil %: 53.9 %

## 2014-08-22 NOTE — Consult Note (Signed)
PATIENT NAME:  Riley LamyBROADIE, Otho MR#:  [id number] DATE OF BIRTH:  1985-05-13  DATE OF CONSULTATION:  07/31/2014  CONSULTING PHYSICIAN:  Lori Popowski K. Guss Bundehalla, MD  PLACE [facility] DICTATION: [facility] - [facility].   AGE: 29 years.   SEX: Male.  RACE: African American.   SUBJECTIVE: The patient was seen in [facility] at [facility] Room [facility]. The patient is a 29 year old African American male who has a long history of mental illness, schizoaffective disorder, and is well known to this Emergency Room.  The patient was [facility] and he was sent to a group home called "[facility]" and he walked to the mall, and he was IVC'd and brought here by  police. This is a setback kind of behavior which has been going on for the past few months or years.    CHIEF COMPLAINT: "I have a job. I am employed at [employer] and I am a Conservation officer, naturecashier and a cook. I need to go to my job." Talking like this, the patient started giggling.   PAST PSYCHIATRIC HISTORY: History of inpatient hospitalization on psychiatry on many occasions for schizoaffective disorder. The patient had [facility] years on 1 occasion.     ALCOHOL AND DRUGS: Denied. According to information obtained, he was kicked out of the [facility].  MENTAL STATUS: The patient is dressed in hospital clothes, alert and oriented, calm and cooperative, giggling and laughing inappropriately. Denies feeling depressed. Denies feeling hopeless or helpless. Denies hearing voices or seeing things. However, thought processes are illogical and he has no goal direction. Denies suicidal or homicidal plan. Insight and judgment are guarded versus poor versus impaired. Impulse control poor.   IMPRESSION: Schizoaffective disorder, bipolar.   RECOMMENDATIONS: Start patient back on the medications that he was discharged from at [facility]. I  discussed with the staff and discussed with the ER physician that patient will be ready for discharge when a different group home placement is found which will be congenial for his baseline functioning.     ____________________________ Jannet MantisSurya K. Guss Bundehalla, MD skc:tr D: 07/31/2014 15:57:18 ET T: 07/31/2014 17:01:24 ET JOB#: [id number]  cc: Monika SalkSurya K. Guss Bundehalla, MD, <Dictator> Beau FannySURYA K Lorijean Husser MD ELECTRONICALLY SIGNED 08/01/2014 14:05

## 2014-08-22 NOTE — Consult Note (Signed)
Psychiatry: Follow-up for this 29 year old man with schizoaffective disorder.  No new complaints today.  He has been less delusional today although still hyperactive.  On review of systems he has no physical complaints at all.  Denies suicidal ideation denies hallucinations. mental status this young man continues to be hyperactive with a lot of pacing.  Speech is loud.  Behavior is often intrusive affect labile.  Denies suicidal or homicidal ideation denies hallucinations. has failed multiple attempts at sustaining outpatient treatment.  Multiple hospitalizations in a row.  [facility] and we are told that a bed will be available tomorrow.  [facility]. schizoaffective disorder rule out developmental disability  Electronic Signatures: Clapacs, Jackquline DenmarkJohn T (MD)  (Signed on 01-Apr-16 19:22)  Authored  Last Updated: 01-Apr-16 19:22 by Audery Amellapacs, [name] (MD)

## 2014-08-22 NOTE — Consult Note (Signed)
PATIENT NAME:  Riley LamyBROADIE, Breck MR#:  [id number] DATE OF BIRTH:  1985/11/27  DATE OF CONSULTATION:  07/17/2014  CONSULTING PHYSICIAN:  Charlotte Brafford K. Reichen Hutzler, MD  SUBJECTIVE: The patient was [facility] in [facility]. The patient is a 29 year old, African-American male, not employed and has a long history of mental illness. [facility] a different group home. The patient had been at this new group home for a few days and he shares a small room with another client. The patient reports that he got into a disagreement with the other client, who is his roommate, and he did not want to get into an altercation, so he walked away and went to the mall. The group home got concerned and he was brought here for help. According to the information obtained from the staff, the patient got agitated and he almost tore the downstairs behavioral health unit right down, was difficult to control and be helped because of the same. He has a lot of behavioral disturbances, which need to be addressed in a highly structured environment.   OBJECTIVE. The patient was seen lying in bed, dressed in hospital clothes. Alert and oriented. Giggling and smiling while talking about his problems. Absolutely no insight into his problems. Probably responding to internal stimuli, though he denies auditory or visual hallucinations. Denies feeling depressed. Does admit that he gets into altercations with people around and gets upset for every small reason. Cognition is below average. Insight and judgment impaired. Behavior is unpredictable and could be dangerous to himself or others.   IMPRESSION: Schizoaffective disorder with psychosis, in exacerbation.   PLAN: Continue current medications. We will [facility] a [facility] for more structure and help as needed by him at this time.      ____________________________ Jannet MantisSurya K. Guss Bundehalla,  MD skc:TT D: 07/17/2014 10:09:59 ET T: 07/17/2014 13:00:33 ET JOB#: [id number]  cc: Monika SalkSurya K. Guss Bundehalla, MD, <Dictator> Beau FannySURYA K Elchanan Bob MD ELECTRONICALLY SIGNED 07/18/2014 17:34

## 2014-08-22 NOTE — Consult Note (Signed)
PAtient with schizoaffective disorder and developmental disorder continues to be minimal behavior problem. Has no complaints himself ROS denies any suicidal ideation and denies any hallucinations.No physical complaints. groomed and calm. Still has delusions but has not been threatening. medication with no evident side effects. continued monitering in ER pending placement  Electronic Signatures: Jomar Denz, Jackquline DenmarkJohn T (MD)  (Signed on 21-Apr-16 21:26)  Authored  Last Updated: 21-Apr-16 21:26 by Audery Amellapacs, Khaidyn Staebell T (MD)

## 2014-08-22 NOTE — Consult Note (Signed)
Brief Consult Note: Diagnosis: Schizoaffective Disorder, MR.   Patient was seen by consultant.   Recommend further assessment or treatment.   Orders entered.   Comments: Pt seen in [facility] B[facility]. He stated that he had some conflict at group home which led to his admission. He was happy and jovial and was noted to be dancing in the ED. His initial intake reported that he was aggressive and disorganized, but he did not show any signs of the same in the ED. he was pleasant and cooperative with the staff. he was complaint with meds. He denied perceptual disturbances and is looking forward to be discharged.   Plan;  I will d/c IVC as he does not meet the criteria.  Add Klonopin 0.5 mg po BID for mood stabilization.  Continue Depakote and Zyprexa as prescribed.  he will followup with his psychiatrist after d/c and will return to [facility].  [facility] and they agreed with plan.  Electronic Signatures: Rhunette CroftFaheem, Idabell Picking S (MD)  (Signed 25-Jan-16 11:14)  Authored: Brief Consult Note   Last Updated: 25-Jan-16 11:14 by Rhunette CroftFaheem, Jansel Vonstein S (MD)

## 2014-08-22 NOTE — Consult Note (Signed)
PATIENT NAME:  [name], Riley MR#:  [id number] DATE OF BIRTH:  07-21-1985  DATE OF CONSULTATION:  05/21/2014  REFERRING PHYSICIAN:   CONSULTING PHYSICIAN:  Audery AmelJohn T. Clapacs, MD  IDENTIFYING INFORMATION AND REASON FOR CONSULTATION: A 29 year old man with a history of schizophrenia and borderline intellectual functioning, who was brought to the hospital because of aggression at his group home for threatening behavior.   CHIEF COMPLAINT: "I don't know."   HISTORY OF PRESENT ILLNESS: Information obtained from the patient and the chart. According to chart, the patient lost his temper yesterday when he found that other people were getting some kind of privilege that he was not getting. Became verbally aggressive, made multiple statements about killing people, talked about being in a gang. It does not appear that he was actually physically violent or hurt anyone, but he may have damaged some property. The patient tells essentially the same story saying that he was fussing and cussing at people because of his money. He cannot be any more clear than that. He denies that he is having any thoughts of hurting anyone or hurting himself. Says that his mood is feeling better than yesterday. He claims that he is fully compliant with his medicine. Denies that he is abusing any alcohol or drugs. He is unable to report any specific stressor recently. Says that he sleeps okay. He has no physical problems.   PAST PSYCHIATRIC HISTORY: [name] has a long history of mental health problems going back into childhood. Has had multiple hospitalizations including [facility] facility. Multiple medications have been tried including medicines for ADHD, mood stabilizers, antipsychotics and nothing seems to have completely changed his behavior. He has a tendency to lose his temper and become verbally aggressive, making crazy statements at times, but does not appear to have a major history of actually being  violent to others. He is not really capable of being cooperative with treatment other than medication management and supportive therapy in the community. He is currently taking mood stabilizers and antipsychotic medications. No known past history of suicide attempts. Has made threatening statements in the past. No history that is clearly identified of actually being physically violent.   PAST MEDICAL HISTORY: No known significant ongoing medical problems.   SOCIAL HISTORY: Currently living in a group home. [facility] which [facility], but then more recently has been moved to a group home here in our area again. I believe he has a guardian.   SUBSTANCE ABUSE HISTORY: Denies any alcohol or drug use, not clear that that has ever been a part of his problem.   REVIEW OF SYSTEMS: Currently denies any physical symptoms at all. Full review of systems negative. Mood is okay. Denies suicidal or homicidal ideation. Denies hallucinations.   MENTAL STATUS EXAMINATION: Neatly groomed man, looks his stated age, cooperative with the interview. Eye contact poor. Psychomotor activity sluggish. He mostly stays still in bed with blankets up. Affect flat. Mood all right. Thoughts are slow but not psychotic. Does not make any paranoid or bizarre statements. Not evidently delusional. Denies auditory or visual hallucinations. Denies suicidal or homicidal ideation. He can repeat 3 words immediately, remembers only 1 at 3 minutes. Alert to being in the hospital. Does not know the year. Judgment and insight chronically some degree of impairment. Intelligence low.   CURRENT MEDICATIONS: Depakote 1500 mg at night and 1000 mg in the morning, Zyprexa 20  mg once a day at night and 5 mg twice a day as needed, melatonin 3 mg once a day.   ALLERGIES: BENADRYL.   VITAL SIGNS: Blood pressure is 132/69, respirations 20, pulse 69, temperature  97.6.   ASSESSMENT: A 29 year old man with a history of schizophrenia and chronic cognitive disorder who has a history of becoming agitated. He was verbally agitated yesterday, but did not commit any actual violence. Currently, he is completely calm and lucid. Denies suicidal or homicidal ideation and appears to be at his baseline. No longer meets commitment criteria.   TREATMENT PLAN: The patient will be discharged back to his group home on his current medication. He has followup arranged already in the community. I believe he has an ACT team. The patient is encouraged to try to work further on controlling his temper.   DIAGNOSIS, PRINCIPAL AND PRIMARY:  AXIS I: Schizoaffective disorder.   SECONDARY DIAGNOSES: AXIS I: No further.  AXIS II: Borderline mental retardation.  AXIS III: No diagnosis.    ____________________________ Audery Amel, MD jtc:at D: 05/21/2014 15:22:07 ET T: 05/21/2014 15:33:11 ET JOB#: [id number]  cc: Audery Amel, MD, <Dictator> Audery Amel MD ELECTRONICALLY SIGNED 06/02/2014 17:23

## 2014-08-22 NOTE — Consult Note (Signed)
PATIENT NAME:  [name], [name] MR#:  [id number] DATE OF BIRTH:  [birth year]  DATE OF CONSULTATION:  06/14/2014  REFERRING PHYSICIAN:   CONSULTING PHYSICIAN:  Audery AmelJohn T. Brendyn Mclaren, MD  HOSPITAL COURSE: This is a followup note for this 29 year old man with a history of schizoaffective disorder who has been in our Emergency Room more often than not for the last week or two. He has been in the Emergency Room currently since at least last Friday. The patient has a history of agitation and verbally hostile behavior and has not been able to get along at his group home. On 3 occasions last week, his group home sent him to the Emergency Room saying that he was violent and threatening. On every one of those occasions, he was at his baseline mental state without any obvious imminent threat. He was sent back to the group home and was returned to us usually within less than a day. Since being here in the Emergency Room, [name] has been at times a little bit labile and noisy, but he has not been violent or threatened violent to anyone. Today, he has no chief complaint. Says that he is feeling all right. No obvious delusions or psychosis. He has been compliant with medications. We are unable to find any living situation for him at this time.   MENTAL STATUS EXAMINATION: Disheveled gentleman, poorly groomed. Speech is labile in amount, sometimes loud and hyperverbal, other times minimally responsive. Mood is stated as all right. Affect can be pretty labile as well. No obvious delusions or bizarre thinking. Denies suicidal or homicidal ideation.   ASSESSMENT: [name] has been returned to the Emergency Room several times over the past week and now his group home is refusing to have him come back. Unclear how long it will be before we can get him out of here and also unclear whether there might be some way to improve his behavior so that he will last longer next time he gets out of the hospital. He would be admitted  downstairs for attempts to continue stabilizing his mood.   TREATMENT PLAN: Admit to psychiatry. Continue current medications, which are combination of Klonopin, Depakote, and Zyprexa.   DIAGNOSIS, PRINCIPAL AND PRIMARY:  AXIS I: Schizoaffective disorder, bipolar type.   SECONDARY DIAGNOSES: AXIS I: No further.  AXIS II: Mild mental retardation.  AXIS III: No diagnosis.   ____________________________ Audery AmelJohn T. Marquise Wicke, MD jtc:sw D: 06/14/2014 14:54:40 ET T: 06/14/2014 15:12:18 ET JOB#: [id number]  cc: Audery AmelJohn T. Raihan Kimmel, MD, <Dictator> Audery AmelJOHN T Lejuan Botto MD ELECTRONICALLY SIGNED 06/29/2014 10:41

## 2014-08-22 NOTE — Consult Note (Signed)
PATIENT NAME:  [name], [name], [name]  DATE OF CONSULTATION:  07/19/2014  REFERRING PHYSICIAN:   CONSULTING PHYSICIAN:  Audery AmelJohn T. Clapacs, MD  HOSPITAL COURSE: This is a 29 year old man who has been in the Emergency Room for about 70 hours now. Currently, the patient denies any active symptoms. He is requesting discharge home.   PAST PSYCHIATRIC HISTORY: Multiple hospitalizations for chronic problems with hyperactive and occasionally threatening behavior. Referred here once again from his group home because of problem behavior. The patient has not been aggressive or dangerous since being in the Emergency Room. He has stayed on his current medication.   REVIEW OF SYSTEMS: Complete 12 point review of systems is entirely negative, including being negative for all psychiatric symptoms.   MENTAL STATUS EXAMINATION: A somewhat slovenly young man. Eye contact good. Psychomotor activity calm. Speech is normal in rate, tone, and volume. Affect is blunted. Mood stated as fine. Thoughts are slow but lucid. No obvious delusions. Denies hallucinations. Denies suicidal or homicidal ideation. He is alert and oriented x4. Short and long-term memory intact to testing. Judgment and insight are adequate.   CURRENT MEDICATIONS: Clozapine 200 mg at night and 150 mg in the morning, Colace 200 mg twice a day, Glucophage extended release 500 mg twice a day, metoprolol 25 mg twice a day, senna 2 tablets at night.   ASSESSMENT: A 29 year old man no longer expressing any suicidal or homicidal ideation. Returned to his baseline or better mental state. Group home is willing to have him return home. The patient can be released from the Emergency Room and will be discharged back to his group home to follow up with his local provider as previously.   DIAGNOSIS, PRINCIPAL AND PRIMARY:  AXIS I: Schizoaffective disorder.   SECONDARY DIAGNOSES: AXIS I: No further.  AXIS II: Rule out chronic  developmental disability.  AXIS III: Overweight, metabolic syndrome, chronic constipation.  ____________________________ Audery AmelJohn T. Clapacs, MD jtc:sb D: 07/19/2014 16:20:51 ET T: 07/19/2014 16:39:55 ET JOB#: [id number]  cc: Audery AmelJohn T. Clapacs, MD, <Dictator> Audery AmelJOHN T CLAPACS MD ELECTRONICALLY SIGNED 07/26/2014 9:59

## 2014-08-22 NOTE — Consult Note (Signed)
Psychiatry: PAtient seen. Stil grandiose but calmer and less bizzare and less agitated. Cooperative with medication. Denies any SI or HI. disorder. Reevaluate tomorrow for possible discharge.  Electronic Signatures: Ashiyah Pavlak, Jackquline DenmarkJohn T (MD)  (Signed on 31-Mar-16 23:32)  Authored  Last Updated: 31-Mar-16 23:32 by Audery Amellapacs, Ladana Chavero T (MD)

## 2014-08-22 NOTE — Consult Note (Signed)
Psychiatry: This is a follow-up note for this 29 year old man well known to us with schizoaffective disorder and possible developmental disorder.  He is brought back to the emergency room shortly after having [facility].  Once again he went to a group home and immediately acted out by walking away.  On interview today the patient has no complaints.  He requests discharge.  States that he will "behave" and stop getting in the way of his treatment. review of systems his complete physical review of systems is negative.  He denies hallucinations and suicidal and homicidal ideation. mental status exam this is an adequately groomed man looks his stated age.  Cooperative with interview but passive.  Eye contact good.  Psychomotor activity during the interview was calm although he has been a little agitated at other times.  Speech is decreased in total amount.  He can get loud when he is emotionally engaged.  Affect speaking to me was calm and euthymic although at other times he gets pretty worked up.  Denies suicidal or homicidal ideation.  Denies hallucinations. discussed with emergency room doctor, psychiatry nurse, patient's caseworker.  Very challenging case of young man who refuses to comply with treatment or living situations and inevitably gets himself brought back to the hospital by his behavior.  We are starting to run short of places for him to live.  I am told that the current living situation is refusing to have him back.  Patient is frustrated about this. all current medicines.  I discussed when necessary's with the emergency room nurse and of put in orders for when necessary Ativan and one time Zyprexa as well.  Will follow-up tomorrow. Schizoaffective disorder, rule out developmental disability  Electronic Signatures: Muath Hallam, Jackquline DenmarkJohn T (MD)  (Signed on 11-Apr-16 17:17)  Authored  Last Updated: 11-Apr-16 17:17 by Audery Amellapacs, Rylie Knierim T (MD)

## 2014-08-22 NOTE — Consult Note (Signed)
Psychiatry: Riley RoanPAtient een. He is agitated and angry today. Wants to leave. Unable to articulate a reasonable plan for self care. Delusional and paranoid. Hostile and threatening. Patient placed under IVC. We can seek another group home if possible but if his behavior continues we will [facility]. Added zyprexa 10mg  q4 prn agitation. Dx schizoaffective disorder  Electronic Signatures: Clapacs, Jackquline DenmarkJohn T (MD)  (Signed on 12-Apr-16 17:26)  Authored  Last Updated: 12-Apr-16 17:26 by Audery Amellapacs, [name] (MD)

## 2014-08-22 NOTE — Consult Note (Signed)
PATIENT NAME:  [name], [name] MR#:  [id number] DATE OF BIRTH:  [birth year]  DATE OF CONSULTATION:  07/18/2014  REFERRING PHYSICIAN:   CONSULTING PHYSICIAN:  Burlon Centrella K. Guss Bundehalla, MD  [facility]  AGE: 29 years.  SEX: Male.  RACE: African American.  SUBJECTIVE: The patient was seen again in reconsultation on 07/18/2014. Per staff report, the patient has been cooperative, no agitation, and he is easily redirectable. Group home was contacted and they stated that they will be glad to take the patient home because he is stabilized.   OBJECTIVE:  MENTAL STATUS EXAMINATION: Dressed in hospital scrubs. Alert and oriented. Calm and cooperative. No agitation. Affect is neutral. Mood stable. Does not appear to be actively responding to internal stimuli. Insight and judgment fair. Impulse control is fair. The patient is eager to go home and realizes that he should tell the staff whenever he is going outside.   IMPRESSION: Schizoaffective disorder, currently stable.   PLAN: Continue current medications. The patient is discontinued of involuntary commitment and tomorrow the patient will go to the group home as they are eager to pick him up.          ____________________________ Jannet MantisSurya K. Guss Bundehalla, MD skc:TT D: 07/18/2014 18:22:00 ET T: 07/18/2014 23:20:08 ET JOB#: [id number]  cc: Monika SalkSurya K. Guss Bundehalla, MD, <Dictator> Beau FannySURYA K Jilda Kress MD ELECTRONICALLY SIGNED 07/24/2014 18:49
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
## 2014-08-22 NOTE — Consult Note (Signed)
PATIENT NAME:  Riley LamyBROADIE, Christphor MR#:  [id number] DATE OF BIRTH:  05-06-1985  DATE OF CONSULTATION:  05/16/2014  CONSULTING PHYSICIAN:  Fonda Rochon K. Guss Bundehalla, MD  [facility]  SUBJECTIVE:  [facility].  The patient is a 29 year old African American male who has been living at a structured living facility. The patient got agitated and got into conflicts with another person. According to information obtained, he did not want to "look at   the dude" and so he tried to walk away. The patient reports that these arguments were not "major," but he just walked away, but, however, they got concerned about him.  According to the information obtained by the staff, the patient has not been taking medication, has been agitated and he was not resting and getting into arguments and is difficult to be managed.   MENTAL STATUS:  The patient was seen lying calm in bed, alert and oriented, calm, pleasant and cooperative. No agitation.  Denies feeling depressed, denies being hopeless or helpless. Denies responding to internal stimuli.  Admits that he wants to go back to the group home and he realizes that he has to walk away, not into conflicts or arguments with the staff there.  However, insight and judgment appears to be guarded, impulse control poor.  IMPRESSION:  Schizoaffective disorder, bipolar, current episode depressed.  PLAN:  Can start patient back on his medications and just the same will continue Depakote 1500 mg twice a day, increase his Zyprexa to 20 mg twice a day, trazodone 100 mg p.o. at bedtime to help him rest at night.  Hopefully, he shows improvement with the help of medications and calms down so that he can be sent back to the same facility as desired by him.   ____________________________ Jannet MantisSurya K. Guss Bundehalla, MD skc:LT D: 05/16/2014 16:08:23 ET T: 05/16/2014 20:12:23 ET JOB#: [id number]  cc: Monika SalkSurya K. Guss Bundehalla,  MD, <Dictator> Beau FannySURYA K Bartlett Enke MD ELECTRONICALLY SIGNED 05/22/2014 11:56
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
## 2014-08-22 NOTE — Consult Note (Signed)
Psychiatry: PAtient seen and reviewed. No new complaints. Remains in high spirits. Loud but not threatening. Does not voice bizzare ideation. had thought we had placement this weekend but it fell through. Continue current medication and continue search for placement. Schizoaffective disorder  Electronic Signatures: Conda Wannamaker, Jackquline DenmarkJohn T (MD)  (Signed on 19-Apr-16 20:50)  Authored  Last Updated: 19-Apr-16 20:50 by Audery Amellapacs, Daianna Vasques T (MD)

## 2014-08-22 NOTE — Consult Note (Signed)
PATIENT NAME:  [name], [name] MR#:  [id number] DATE OF BIRTH:  [birth year]  DATE OF CONSULTATION:  06/12/2014  CONSULTING PHYSICIAN:  Roddy Bellamy K. Tykera Skates, MD  SUBJECTIVE: The patient was seen in consultation in [facility] emergency [facility], [facility]. The patient is a 29 year old, African-American male with a long history of mental illness, schizoaffective disorder, bipolar and has been living at current group home. He was recently discharged and came back to the hospital emergency room twice so far. He was evaluated by Dr. Toni Amendlapacs who recommended inpatient hospitalization but there was no bed available at that time. The patient reports that he had conflicts with other colleagues or other clients or other patients at the group home, for no fault of his and he constantly tries to explain the same.   OBJECTIVE: Dressed in hospital scrubs, alert and oriented, calm and cooperative with the undersigned and the staff. No agitation. Denies feeling depressed. Denies feeling hopeless or helpless. Does not appear to be responding to internal stimuli. Does not have good insight into his problems, which makes him get into conflicts constantly with other people around and they bring him here. Insight and judgment impaired. Impulse control is poor.   IMPRESSION: Schizoaffective disorder, bipolar, in partial remission.   PLAN: Continue and adjust medications as follows: We will increase his Depakote to 2000 mg twice a day, increase his Klonopin to 1 mg 3 times a day, continue trazodone 100 mg at bedtime and Zyprexa Zydis 30 mg twice a day. I will recommend inpatient hospital psychiatry when bed is available.     ____________________________ Jannet MantisSurya K. Guss Bundehalla, MD skc:TT D: 06/12/2014 17:43:53 ET T: 06/12/2014 17:54:41 ET JOB#: [id number]  cc: Monika SalkSurya K. Guss Bundehalla, MD, <Dictator> Beau FannySURYA K Hudson Majkowski MD ELECTRONICALLY SIGNED 06/12/2014 18:36
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
## 2014-08-31 NOTE — H&P (Signed)
PATIENT NAME:  [name], [name] [id number] OF BIRTH:  03/06/1986 OF ADMISSION:  05/21/2014 information: Patient is a 29 year old African American male that resides in at [facility], [phone].     Chief Complaint: "I?m doing better"  HISTORY OF PRESENT ILLNESS:patient presented to the ER on Feb 18. This patient has been in ER multiple times: Jan 24, Jan 28 and Feb 11 with same complaints.  info: Patient reports that he has been compliant with taking his medication.  The group home manager Jenny Reichmann [phone]) reports that the patient's medication is not working because he has extreme mood swings that causes him to act out verbally and physically aggressive when he is not allowed to have his own way.  The manager reports that the patient was asked to leave a [facility] due to his inability to control his anger outbursts.   The group home manager reports that the other residents in the group home are afraid of him.   The group home manager reports that he has been exhibiting this type of behaviors for the past week.  Patient reports that the patient?s behaviors have increased in aggression and he is afraid that the patient may harm one of the other staff members or one of the other residents in the group home.  the patient was interviewed but he is sedated and was falling asleep after each question.  He denied having SI, HI or A/VH. He denied having any side effects or physical complaints.  Patient reported no longer feeling angry at everybody. assessment in the ER: "Patient reports that he was upset at the group home.  Patient reports that he became upset because the staff called the police and told them that he was, "communicating threats to harm the staff and other peers in the group home"  During the assessment the patient was irritated and very loud as he why the group home was, "wrong for calling the police".   Patient had to be redirected several times and asked to lower his voice." Per MD in  ER: "the patient lost his temper yesterday when he found that other people were getting some kind of privilege that he was not getting. Became verbally aggressive, made multiple statements about killing people, talked about being in a gang. It does not appear that he was actually physically violent or hurt anyone, but he may have damaged some property." reports a past history of psychiatric hospitalizations 3 years ago at [facility].  Patient denies SI/HI/Psychosis.  Patient denies substance abuse.  Patient denies physical, sexual or emotional abuse.    MEDICAL HISTORY: Patients states he f/u with [facility] a diagnosis of schizoaffective d/o and mild intellectual disability. Has been hospitalized before at [facility] about 3 years ago. MEDICAL HISTORY: No known significant ongoing medical problems.   HISTORY: unable to obtain as pt is sedated. HISTORY: Currently living in a group home. He had had an extended hospitalization at [facility] after which he was discharged possibly back to [facility], but then more recently has been moved to a group home here in our area again. I believe he has a guardian.   Home Medications: ZyPREXA Zydis 20 mg oral tablet, disintegrating: 1 tab(s) orally 2 times a day Depakote 500 mg oral delayed release tablet: 3 tab(s) orally 2 times a day KlonoPIN 0.5 mg oral tablet: 1 tab(s) orally 2 times a day traZODone 100 mg oral tablet: 1 tab(s) orally once a day (at bedtime)  ALLERGIES: BENADRYL.   REVIEW  OF SYSTEMS: Currently denies any physical symptoms at all. Full review of systems negative. Mood is okay. Denies suicidal or homicidal ideation. Denies hallucinations.  STATUS EXAMINATION: young AAM who appears his stated age.lying in bed, sedated, falling asleep during assessmentactivity: retardedcontact: poor eyes closeddecreased production, volume and tone.process: vaguecontent: neg for SI, HI, or A/V H. "better"constrictedfairfairexam:Alert and oriented times 4of  knowledge is: unable to fully assessand concentration: limited     Nursing and Ancillary Notes:  Nursing and Ancillary Notes: **Vital Signs.:   23-Feb-16 15:43   Vital Signs Type: Admission   Temperature Temperature (F): 98.5   Celsius: 36.9   Pulse Pulse: 65   Respirations Respirations: 18   Systolic BP Systolic BP: 540   Diastolic BP (mmHg) Diastolic BP (mmHg): 65    Labs:  Lab Results: Thyroid:  18-Feb-16 21:12   Thyroid Stimulating Hormone 2.71 (0.45-4.50= International Unit) -----------------------patients have reference for TSH: - - - - - - - - - - first trimetser: 0.36 - 2.50 uIU/mL)  Hepatic:  18-Feb-16 21:12   Bilirubin, Total  0.1  Alkaline Phosphatase 56  SGPT (ALT) 24  SGOT (AST) 31  Total Protein, Serum 7.1  Albumin, Serum 3.4  General Ref:  18-Feb-16 21:12   Acetaminophen, Serum < 2 (10-30TOXIC: > 200 mcg/mL > 50 mcg/mL at 12 hr after ingestion > 300 mcg/mL at 4 hr after ingestion)  Salicylates, Serum < 1.7 (0.0-2.82.8-20.0 mg/dL>30.0 mg/dL)  Routine Chem:  18-Feb-16 21:12   Glucose, Serum 93  BUN 14  Creatinine (comp) 0.94  Sodium, Serum  147  Potassium, Serum 4.4  Chloride, Serum  111  CO2, Serum 32  Calcium (Total), Serum 8.6  Osmolality (calc) 293  eGFR (African American) >60  eGFR (Non-African American) >60 (eGFR values <15m/min/1.73 m2 may be an indication of chronicdisease (CKD).eGFR, using the MRDR Study equation, is useful in with stable renal function.eGFR calculation will not be reliable in acutely ill patientsserum creatinine is changing rapidly. It is not useful inon dialysis. The eGFR calculation may not be applicablepatients at the low and high extremes of body sizes, pregnantand vegetarians.)  Anion Gap  4  [name], S. < 3 (Result(s) reported on 10 Jun 2014 at 10:05PM.)  Urine Drugs:  [id number]  Tricyclic Antidepressant, Ur Qual (comp) NEGATIVE (Result(s) reported on 10 Jun 2014 at 10:15PM.)  Amphetamines, Urine  Qual. NEGATIVE  MDMA, Urine Qual. NEGATIVE  Cocaine Metabolite, Urine Qual. NEGATIVE  Opiate, Urine qual NEGATIVE  Phencyclidine, Urine Qual. NEGATIVE  Cannabinoid, Urine Qual. NEGATIVE  Barbiturates, Urine Qual. NEGATIVE  Benzodiazepine, Urine Qual. NEGATIVE (-----------------URINE DRUG SCREEN provides only a preliminary, unconfirmedtest result and should not be used for non-medical  Clinical consideration and professional judgment should be to any positive drug screen result due to possiblesubstances.  A more specific alternate chemical methodbe used in order to obtain a confirmed analytical result.  Gasspectrometry (GC/MS) is the preferredmethod.)  Methadone, Urine Qual. NEGATIVE  Routine UA:  18-Feb-16 21:12   Color (UA) Yellow  Clarity (UA) Clear  Glucose (UA) Negative  Bilirubin (UA) Negative  Ketones (UA) Trace  Specific Gravity (UA) 1.028  Blood (UA) Negative  pH (UA) 8.0  Protein (UA) Negative  Nitrite (UA) Negative  Leukocyte Esterase (UA) Negative (Result(s) reported on 10 Jun 2014 at 10:10PM.)  RBC (UA) NONE SEEN  WBC (UA) NONE SEEN  Bacteria (UA) NONE SEEN  Epithelial Cells (UA) <1 /HPF  Mucous (UA) PRESENT  Triple Phosphate Crystal (UA) PRESENT (Result(s) reported on 10 Jun 2014  at 10:10PM.)  Routine Hem:  18-Feb-16 21:12   WBC (CBC) 5.8  RBC (CBC) 4.47  Hemoglobin (CBC) 13.1  Hematocrit (CBC) 41.6  Platelet Count (CBC) 190 (Result(s) reported on 10 Jun 2014 at 09:33PM.)  MCV 93  MCH 29.4  MCHC  31.6  RDW 13.8   I: Schizoaffective disorder. II: mild intellectual disability  AXIS III: No diagnosis.  A 29 year old man with a history of schizophrenia and chronic cognitive disorder who has a history of becoming agitated. He was verbally agitated and displayed threatening behaviors at the [facility].    d/o :the weekend in the ER his olanzapine was increased from 40 mg to 60 mg.  Today I will decreased back to 20 mg po bid but will hold the medication until tomorrow as  pt is sedatedthe weekend also his Depakote was increased to 2000 mg po bid.  As pt is sedated today I will decreased back to 1500 mg bid but will hold dose until tomorrow will decrease klonopin back to his home dose of 0.5 mg po bid.  Dose will also be hold until tomorrow will change trazodone to prn only will start fall precautions will check Depakote level, ammonia, LFT?s, HbA1c and lipid panel in am   Electronic Signatures: Hildred Priest (MD)  (Signed on 23-Feb-16 16:46)  Authored  Last Updated: 23-Feb-16 16:46 by Hildred Priest (MD)

## 2014-09-01 ENCOUNTER — Encounter (HOSPITAL_COMMUNITY): Payer: Self-pay | Admitting: Emergency Medicine

## 2014-09-01 ENCOUNTER — Emergency Department (HOSPITAL_COMMUNITY)
Admission: EM | Admit: 2014-09-01 | Discharge: 2014-09-02 | Disposition: A | Discharge status: Home / Self Care | Payer: Medicaid Other | Attending: Emergency Medicine | Admitting: Emergency Medicine

## 2014-09-01 DIAGNOSIS — Z8719 Personal history of other diseases of the digestive system: Secondary | ICD-10-CM | POA: Insufficient documentation

## 2014-09-01 DIAGNOSIS — F3170 Bipolar disorder, currently in remission, most recent episode unspecified: Secondary | ICD-10-CM | POA: Insufficient documentation

## 2014-09-01 DIAGNOSIS — F419 Anxiety disorder, unspecified: Secondary | ICD-10-CM | POA: Insufficient documentation

## 2014-09-01 DIAGNOSIS — Z79899 Other long term (current) drug therapy: Secondary | ICD-10-CM | POA: Insufficient documentation

## 2014-09-01 DIAGNOSIS — Z720 Tobacco use: Secondary | ICD-10-CM | POA: Insufficient documentation

## 2014-09-01 DIAGNOSIS — F250 Schizoaffective disorder, bipolar type: Secondary | ICD-10-CM | POA: Diagnosis present

## 2014-09-01 DIAGNOSIS — F209 Schizophrenia, unspecified: Secondary | ICD-10-CM | POA: Insufficient documentation

## 2014-09-01 DIAGNOSIS — Z8669 Personal history of other diseases of the nervous system and sense organs: Secondary | ICD-10-CM | POA: Insufficient documentation

## 2014-09-01 DIAGNOSIS — R443 Hallucinations, unspecified: Secondary | ICD-10-CM | POA: Diagnosis present

## 2014-09-01 DIAGNOSIS — F319 Bipolar disorder, unspecified: Secondary | ICD-10-CM

## 2014-09-01 LAB — CBC WITH DIFFERENTIAL/PLATELET
Basophils Absolute: 0 10*3/uL (ref 0.0–0.1)
Basophils Relative: 0 % (ref 0–1)
EOS ABS: 0.2 10*3/uL (ref 0.0–0.7)
Eosinophils Relative: 2 % (ref 0–5)
HCT: 37.3 % — ABNORMAL LOW (ref 39.0–52.0)
Hemoglobin: 11.6 g/dL — ABNORMAL LOW (ref 13.0–17.0)
LYMPHS PCT: 32 % (ref 12–46)
Lymphs Abs: 2.7 10*3/uL (ref 0.7–4.0)
MCH: 28.2 pg (ref 26.0–34.0)
MCHC: 31.1 g/dL (ref 30.0–36.0)
MCV: 90.5 fL (ref 78.0–100.0)
MONOS PCT: 7 % (ref 3–12)
Monocytes Absolute: 0.6 10*3/uL (ref 0.1–1.0)
NEUTROS PCT: 59 % (ref 43–77)
Neutro Abs: 4.9 10*3/uL (ref 1.7–7.7)
Platelets: 320 10*3/uL (ref 150–400)
RBC: 4.12 MIL/uL — ABNORMAL LOW (ref 4.22–5.81)
RDW: 12.7 % (ref 11.5–15.5)
WBC: 8.3 10*3/uL (ref 4.0–10.5)

## 2014-09-01 LAB — COMPREHENSIVE METABOLIC PANEL
ALT: 17 U/L (ref 17–63)
AST: 22 U/L (ref 15–41)
Albumin: 3.9 g/dL (ref 3.5–5.0)
Alkaline Phosphatase: 54 U/L (ref 38–126)
Anion gap: 7 (ref 5–15)
BILIRUBIN TOTAL: 0.6 mg/dL (ref 0.3–1.2)
BUN: 10 mg/dL (ref 6–20)
CO2: 26 mmol/L (ref 22–32)
CREATININE: 0.82 mg/dL (ref 0.61–1.24)
Calcium: 9 mg/dL (ref 8.9–10.3)
Chloride: 112 mmol/L — ABNORMAL HIGH (ref 101–111)
GLUCOSE: 108 mg/dL — AB (ref 70–99)
Potassium: 3.3 mmol/L — ABNORMAL LOW (ref 3.5–5.1)
Sodium: 145 mmol/L (ref 135–145)
Total Protein: 6.9 g/dL (ref 6.5–8.1)

## 2014-09-01 LAB — ETHANOL: Alcohol, Ethyl (B): <5 mg/dL (ref <5)

## 2014-09-01 LAB — LITHIUM LEVEL: Lithium Lvl: 0.65 mmol/L (ref 0.60–1.20)

## 2014-09-01 LAB — RAPID URINE DRUG SCREEN, HOSP PERFORMED
Amphetamines: NOT DETECTED
BARBITURATES: NOT DETECTED
BENZODIAZEPINES: POSITIVE — AB
COCAINE: NOT DETECTED
Opiates: NOT DETECTED
Tetrahydrocannabinol: NOT DETECTED

## 2014-09-01 LAB — SALICYLATE LEVEL

## 2014-09-01 LAB — ACETAMINOPHEN LEVEL: Acetaminophen (Tylenol), Serum: <10 ug/mL — ABNORMAL LOW (ref 10–30)

## 2014-09-01 NOTE — ED Provider Notes (Signed)
CSN: [id number]     Arrival date & time 09/01/14  2139 History  This chart was scribed for Riley PorterMark Willamae Demby, MD by Modena JanskyAlbert Thayil, ED Scribe. This patient was seen in room APA17/[facility]17 and the patient's care was started at 11:13 PM.   Chief Complaint  Patient presents with  . V70.1   The history is provided by the patient. No language interpreter was used.   HPI Comments: Riley LamyJoseph Torres is a 29 y.o. male who presents to the Emergency Department complaining of hallucinations that started about 3 days ago. He reports that he has not been feeling himself and having intermittent hallucinations the past 3 days ago. He states that he has been seeing things, such as ghosts, demons, and leprechauns, especially today. He reports that his medication usually provides relief, but did not today. He states that he has a hx of [place].   Past Medical History  Diagnosis Date  . Bipolar 1 disorder   . Schizophrenia, acute   . Seizures   . Acid reflux   . Anxiety    Past Surgical History  Procedure Laterality Date  . Abdominal surgery     History reviewed. No pertinent family history. History  Substance Use Topics  . Smoking status: Current Every Day Smoker -- 0.50 packs/day    Types: Cigarettes  . Smokeless tobacco: Not on file  . Alcohol Use: Yes    Review of Systems  Constitutional: Negative for fever, chills, diaphoresis, appetite change and fatigue.  HENT: Negative for mouth sores, sore throat and trouble swallowing.   Eyes: Negative for visual disturbance.  Respiratory: Negative for cough, chest tightness, shortness of breath and wheezing.   Cardiovascular: Negative for chest pain.  Gastrointestinal: Negative for nausea, vomiting, abdominal pain, diarrhea and abdominal distention.  Endocrine: Negative for polydipsia, polyphagia and polyuria.  Genitourinary: Negative for dysuria, frequency and hematuria.  Musculoskeletal: Negative for gait problem.  Skin: Negative for  color change, pallor and rash.  Neurological: Negative for dizziness, syncope, light-headedness and headaches.  Hematological: Does not bruise/bleed easily.  Psychiatric/Behavioral: Positive for hallucinations. Negative for behavioral problems and confusion.    Allergies  Peanut-containing drug products and Benadryl  Home Medications   Prior to Admission medications   Medication Sig Start Date End Date Taking? Authorizing Provider  acetaminophen (TYLENOL) 325 MG tablet Take 650 mg by mouth every 4 (four) hours as needed for mild pain or moderate pain.   Yes Historical Provider, MD  cloZAPine (CLOZARIL) 100 MG tablet Take 150-200 mg by mouth 2 (two) times daily. 1.5 tablets in the morning and 2 tablets at bedtime   Yes Historical Provider, MD  docusate sodium (COLACE) 100 MG capsule Take 200 mg by mouth 2 (two) times daily.   Yes Historical Provider, MD  lithium carbonate 300 MG capsule Take 600 mg by mouth 2 (two) times daily.   Yes Historical Provider, MD  LORazepam (ATIVAN) 2 MG tablet Take 2 mg by mouth every 4 (four) hours as needed for anxiety.   Yes Historical Provider, MD  metFORMIN (GLUCOPHAGE) 500 MG tablet Take 500 mg by mouth 2 (two) times daily with a meal.   Yes Historical Provider, MD  metoprolol tartrate (LOPRESSOR) 25 MG tablet Take 25 mg by mouth 2 (two) times daily.   Yes Historical Provider, MD  OLANZapine (ZYPREXA) 10 MG tablet Take 10 mg by mouth every 4 (four) hours as needed.   Yes Historical Provider, MD  Sennosides (SENNA-LAX PO) Take 2 tablets  by mouth daily.   Yes Historical Provider, MD   BP 128/69 mmHg  Pulse 86  Temp(Src) 99 F (37.2 C) (Oral)  Resp 20  Ht 6\' 5"  (1.956 m)  Wt 315 lb (142.883 kg)  BMI 37.35 kg/m2  SpO2 99% Physical Exam  Constitutional: He is oriented to person, place, and time. He appears well-developed and well-nourished. No distress.  HENT:  Head: Normocephalic.  Eyes: Conjunctivae are normal. Pupils are equal, round, and reactive  to light. No scleral icterus.  Neck: Normal range of motion. Neck supple. No thyromegaly present.  Cardiovascular: Normal rate and regular rhythm.  Exam reveals no gallop and no friction rub.   No murmur heard. Pulmonary/Chest: Effort normal and breath sounds normal. No respiratory distress. He has no wheezes. He has no rales.  Abdominal: Soft. Bowel sounds are normal. He exhibits no distension. There is no tenderness. There is no rebound.  Musculoskeletal: Normal range of motion.  Neurological: He is alert and oriented to person, place, and time.  Skin: Skin is warm and dry. No rash noted.  Psychiatric: He has a normal mood and affect. His behavior is normal.    ED Course  Procedures (including critical care time) DIAGNOSTIC STUDIES: Oxygen Saturation is 99% on RA, normal by my interpretation.    COORDINATION OF CARE: 11:17 PM- Pt advised of plan for treatment which includes labs and pt agrees.  Labs Review Labs Reviewed  CBC WITH DIFFERENTIAL/PLATELET - Abnormal; Notable for the following:    RBC 4.12 (*)    Hemoglobin 11.6 (*)    HCT 37.3 (*)    All other components within normal limits  COMPREHENSIVE METABOLIC PANEL - Abnormal; Notable for the following:    Potassium 3.3 (*)    Chloride 112 (*)    Glucose, Bld 108 (*)    All other components within normal limits  URINE RAPID DRUG SCREEN (HOSP PERFORMED) - Abnormal; Notable for the following:    Benzodiazepines POSITIVE (*)    All other components within normal limits  ACETAMINOPHEN LEVEL - Abnormal; Notable for the following:    Acetaminophen (Tylenol), Serum <10 (*)    All other components within normal limits  LITHIUM LEVEL  [name]  SALICYLATE LEVEL    Imaging Review No results found.   EKG Interpretation None      MDM   Final diagnoses:  Bipolar affective disorder, most recent episode unspecified type, remission status unspecified    I personally performed the services described in this documentation,  which was scribed in my presence. The recorded information has been reviewed and is accurate.     Riley PorterMark [name], MD 09/06/14 [phone]

## 2014-09-01 NOTE — ED Notes (Signed)
Pt asking for chocolate milk & cereal. Pt informed we do not have any chocolate milk in department.

## 2014-09-01 NOTE — ED Notes (Signed)
Pt states he is having auditory or visual hallucinations. Pt denies si/hi ideations.

## 2014-09-01 NOTE — ED Notes (Signed)
Pt states he is seeing ghost, demons & leprechauns, pt says they are telling him to hurt himself & other people but he keeps telling them no. Pt thinks his meds need to be increased.

## 2014-09-02 DIAGNOSIS — F250 Schizoaffective disorder, bipolar type: Secondary | ICD-10-CM

## 2014-09-02 NOTE — Progress Notes (Signed)
Spoke with Nanine MeansJamison Lord, NP, who has evaluated pt and states he is recommended for discharge to return to group home and follow up with outpatient psychiatric care.  Spoke with [facility] RN regarding pt's disposition.  Ilean SkillMeghan Willson Lipa, MSW, LCSWA Clinical Social Work, Disposition  09/02/2014 [phone]

## 2014-09-02 NOTE — Consult Note (Signed)
Telepsych Consultation   Reason for Consult:  Hallucinations Referring Physician:  EDP Patient Identification: [name] MRN:  [id number] Principal Diagnosis: Schizoaffective disorder, bipolar type Diagnosis:   Patient Active Problem List   Diagnosis Date Noted  . 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 Schizoaffective disorder, bipolar type [F25.0] 11/20/2012    Priority: High  . Dehydration [E86.0] 03/25/2014  . AKI (acute kidney injury) [N17.9] 03/25/2014  . Nausea with vomiting [R11.2] 03/25/2014  . Hypokalemia [E87.6] 03/25/2014    Total Time spent with patient: 45 minutes  Subjective:   [name] is a 29 y.o. male patient has stabilized.  HPI:  The patient came to the ED for hallucinations from his group home.  He has stabilized in the ED on medications for the past three days.  [name] denies suicidal/homicidal ideations, hallucinations, and alcohol/drug abuse.  He wants to return to his group home and stable to discharge. HPI Elements:   Location:  generalized. Quality:  acute. Severity:  severe. Timing:  constant. Duration:  few days. Context:  stressors.  Past Medical History:  Past Medical History  Diagnosis Date  . Bipolar 1 disorder   . Schizophrenia, acute   . Seizures   . Acid reflux   . Anxiety     Past Surgical History  Procedure Laterality Date  . Abdominal surgery     Family History: History reviewed. No pertinent family history. Social History:  History  Alcohol Use  . Yes     History  Drug Use No    History   Social History  . Marital Status: Single    Spouse Name: N/A  . Number of Children: N/A  . Years of Education: N/A   Social History Main Topics  . Smoking status: Current Every Day Smoker -- 0.50 packs/day    Types: Cigarettes  . Smokeless tobacco: Not on file  . Alcohol Use: Yes  . Drug Use: No  . Sexual Activity: No   Other Topics Concern  . None   Social History  Narrative   Additional Social History:    Pain Medications: See MAR  Prescriptions: See MAR  Over the Counter: See MAR  History of alcohol / drug use?: No history of alcohol / drug abuse Longest period of sobriety (when/how long): None                      Allergies:   Allergies  Allergen Reactions  . Peanut-Containing Drug Products   . Benadryl [Diphenhydramine Hcl] Other (See Comments)    Labs:  Results for orders placed or performed during the hospital encounter of 09/01/14 (from the past 48 hour(s))  Drug screen panel, emergency     Status: Abnormal   Collection Time: 09/01/14 10:10 PM  Result Value Ref Range   Opiates NONE DETECTED NONE DETECTED   Cocaine NONE DETECTED NONE DETECTED   Benzodiazepines POSITIVE (A) NONE DETECTED   Amphetamines NONE DETECTED NONE DETECTED   Tetrahydrocannabinol NONE DETECTED NONE DETECTED   Barbiturates NONE DETECTED NONE DETECTED    Comment:        DRUG SCREEN FOR MEDICAL PURPOSES ONLY.  IF CONFIRMATION IS NEEDED FOR ANY PURPOSE, NOTIFY LAB WITHIN 5 DAYS.        LOWEST DETECTABLE LIMITS FOR URINE DRUG SCREEN Drug Class       Cutoff (ng/mL) Amphetamine      1000 Barbiturate      200 Benzodiazepine   200  Tricyclics       211 Opiates          300 Cocaine          300 THC              50   CBC WITH DIFFERENTIAL     Status: Abnormal   Collection Time: 09/01/14 10:47 PM  Result Value Ref Range   WBC 8.3 4.0 - 10.5 K/uL   RBC 4.12 (L) 4.22 - 5.81 MIL/uL   Hemoglobin 11.6 (L) 13.0 - 17.0 g/dL   HCT 37.3 (L) 39.0 - 52.0 %   MCV 90.5 78.0 - 100.0 fL   MCH 28.2 26.0 - 34.0 pg   MCHC 31.1 30.0 - 36.0 g/dL   RDW 12.7 11.5 - 15.5 %   Platelets 320 150 - 400 K/uL   Neutrophils Relative % 59 43 - 77 %   Neutro Abs 4.9 1.7 - 7.7 K/uL   Lymphocytes Relative 32 12 - 46 %   Lymphs Abs 2.7 0.7 - 4.0 K/uL   Monocytes Relative 7 3 - 12 %   Monocytes Absolute 0.6 0.1 - 1.0 K/uL   Eosinophils Relative 2 0 - 5 %   Eosinophils  Absolute 0.2 0.0 - 0.7 K/uL   Basophils Relative 0 0 - 1 %   Basophils Absolute 0.0 0.0 - 0.1 K/uL  Comprehensive metabolic panel     Status: Abnormal   Collection Time: 09/01/14 10:47 PM  Result Value Ref Range   Sodium 145 135 - 145 mmol/L   Potassium 3.3 (L) 3.5 - 5.1 mmol/L   Chloride 112 (H) 101 - 111 mmol/L   CO2 26 22 - 32 mmol/L   Glucose, Bld 108 (H) 70 - 99 mg/dL   BUN 10 6 - 20 mg/dL   Creatinine, Ser 0.82 0.61 - 1.24 mg/dL   Calcium 9.0 8.9 - 10.3 mg/dL   Total Protein 6.9 6.5 - 8.1 g/dL   Albumin 3.9 3.5 - 5.0 g/dL   AST 22 15 - 41 U/L   ALT 17 17 - 63 U/L   Alkaline Phosphatase 54 38 - 126 U/L   Total Bilirubin 0.6 0.3 - 1.2 mg/dL   GFR calc non Af Amer >60 >60 mL/min   GFR calc Af Amer >60 >60 mL/min    Comment: (NOTE) The eGFR has been calculated using the CKD EPI equation. This calculation has not been validated in all clinical situations. eGFR's persistently <60 mL/min signify possible Chronic Kidney Disease.    Anion gap 7 5 - 15  Lithium level     Status: None   Collection Time: 09/01/14 10:54 PM  Result Value Ref Range   Lithium Lvl 0.65 0.60 - 1.20 mmol/L  [name]     Status: None   Collection Time: 09/01/14 10:54 PM  Result Value Ref Range   Alcohol, Ethyl (B) <5 <5 mg/dL    Comment:        LOWEST DETECTABLE LIMIT FOR SERUM ALCOHOL IS 11 mg/dL FOR MEDICAL PURPOSES ONLY   Acetaminophen level     Status: Abnormal   Collection Time: 09/01/14 10:54 PM  Result Value Ref Range   Acetaminophen (Tylenol), Serum <10 (L) 10 - 30 ug/mL    Comment:        THERAPEUTIC CONCENTRATIONS VARY SIGNIFICANTLY. A RANGE OF 10-30 ug/mL MAY BE AN EFFECTIVE CONCENTRATION FOR MANY PATIENTS. HOWEVER, SOME ARE BEST TREATED AT CONCENTRATIONS OUTSIDE THIS RANGE. ACETAMINOPHEN CONCENTRATIONS >150 ug/mL AT 4 HOURS AFTER  INGESTION AND >50 ug/mL AT 12 HOURS AFTER INGESTION ARE OFTEN ASSOCIATED WITH TOXIC REACTIONS.   Salicylate level     Status: None   Collection  Time: 09/01/14 10:54 PM  Result Value Ref Range   Salicylate Lvl <1.6 2.8 - 30.0 mg/dL    Vitals: Blood pressure 136/76, pulse 73, temperature 97.8 F (36.6 C), temperature source Oral, resp. rate 20, height _0  (1.956 m), weight 142.883 kg (315 lb), SpO2 100 %.  Risk to Self: Suicidal Ideation: No Suicidal Intent: No Is patient at risk for suicide?: No Suicidal Plan?: No Access to Means: No What has been your use of drugs/alcohol within the last 12 months?: Denies  How many times?: 0 Other Self Harm Risks: None  Triggers for Past Attempts: None known Intentional Self Injurious Behavior: None Risk to Others: Homicidal Ideation: No Thoughts of Harm to Others: No Current Homicidal Intent: No Current Homicidal Plan: No Access to Homicidal Means: No Identified Victim: None  History of harm to others?: No Assessment of Violence: None Noted Violent Behavior Description: None  Does patient have access to weapons?: No Criminal Charges Pending?: No Does patient have a court date: No Prior Inpatient Therapy: Prior Inpatient Therapy: No Prior Therapy Dates: None  Prior Therapy Facilty/Provider(s): None  Reason for Treatment: None  Prior Outpatient Therapy: Prior Outpatient Therapy: Yes Prior Therapy Dates: Current  Prior Therapy Facilty/Provider(s): Reports an ACTT Team  Reason for Treatment: None  Does patient have an ACCT team?: Yes (Pt unable to provide the name of the company ) Does patient have Intensive In-House Services?  : No Does patient have [facility] services? : No Does patient have P4CC services?: No  No current facility-administered medications for this encounter.   Current Outpatient Prescriptions  Medication Sig Dispense Refill  . acetaminophen (TYLENOL) 325 MG tablet Take 650 mg by mouth every 4 (four) hours as needed for mild pain or moderate pain.    . cloZAPine (CLOZARIL) 100 MG tablet Take 150-200 mg by mouth 2 (two) times daily. 1.5 tablets in the morning and  2 tablets at bedtime    . docusate sodium (COLACE) 100 MG capsule Take 200 mg by mouth 2 (two) times daily.    [name] lithium carbonate 300 MG capsule Take 600 mg by mouth 2 (two) times daily.    [name] LORazepam (ATIVAN) 2 MG tablet Take 2 mg by mouth every 4 (four) hours as needed for anxiety.    . metFORMIN (GLUCOPHAGE) 500 MG tablet Take 500 mg by mouth 2 (two) times daily with a meal.    . metoprolol tartrate (LOPRESSOR) 25 MG tablet Take 25 mg by mouth 2 (two) times daily.    [name] OLANZapine (ZYPREXA) 10 MG tablet Take 10 mg by mouth every 4 (four) hours as needed.    . Sennosides (SENNA-LAX PO) Take 2 tablets by mouth daily.      Musculoskeletal: Strength & Muscle Tone: within normal limits Gait & Station: normal Patient leans: N/A  Psychiatric Specialty Exam:     Blood pressure 136/76, pulse 73, temperature 97.8 F (36.6 C), temperature source Oral, resp. rate 20, height _1  (1.956 m), weight 142.883 kg (315 lb), SpO2 100 %.Body mass index is 37.35 kg/(m^2).  General Appearance: Casual  Eye Contact::  Good  Speech:  Normal Rate  Volume:  Normal  Mood:  Anxious  Affect:  Congruent  Thought Process:  Coherent  Orientation:  Full (Time, Place, and Person)  Thought Content:  WDL  Suicidal Thoughts:  No  Homicidal Thoughts:  No  Memory:  Immediate;   Good Recent;   Good Remote;   Good  Judgement:  Fair  Insight:  Good  Psychomotor Activity:  Normal  Concentration:  Good  Recall:  Good  Fund of Knowledge:Good  Language: Good  Akathisia:  No  Handed:  Right  AIMS (if indicated):     Assets:  Leisure Time Physical Health Resilience Social Support  ADL's:  Intact  Cognition: WNL  Sleep:      Medical Decision Making: Review of Psycho-Social Stressors (1), Review or order clinical lab tests (1) and Review of Medication Regimen & Side Effects (2)   Treatment Plan Summary: Daily contact with patient to assess and evaluate symptoms and progress in treatment, Medication  management and Plan discharge to his group home with follow-up with his regular providers  Plan:  No evidence of imminent risk to self or others at present.   Disposition: discharge to his group home with follow-up with his regular providers  Waylan Boga, [place] 09/02/2014 2:46 PM

## 2014-09-02 NOTE — [facility] Suicide Risk Assessment (Signed)
Suicide Risk Assessment  Discharge Assessment   [facility]   Demographic Factors:  Male and Adolescent or young adult  Total Time spent with patient: 45 minutes  Musculoskeletal: Strength & Muscle Tone: within normal limits Gait & Station: normal Patient leans: N/A  Psychiatric Specialty Exam:     Blood pressure 136/76, pulse 73, temperature 97.8 F (36.6 C), temperature source Oral, resp. rate 20, height 6\' 5"  (1.956 m), weight 142.883 kg (315 lb), SpO2 100 %.Body mass index is 37.35 kg/(m^2).  General Appearance: Casual  Eye Contact::  Good  Speech:  Normal Rate  Volume:  Normal  Mood:  Anxious  Affect:  Congruent  Thought Process:  Coherent  Orientation:  Full (Time, Place, and Person)  Thought Content:  WDL  Suicidal Thoughts:  No  Homicidal Thoughts:  No  Memory:  Immediate;   Good Recent;   Good Remote;   Good  Judgement:  Fair  Insight:  Good  Psychomotor Activity:  Normal  Concentration:  Good  Recall:  Good  Fund of Knowledge:Good  Language: Good  Akathisia:  No  Handed:  Right  AIMS (if indicated):     Assets:  Leisure Time Physical Health Resilience Social Support  ADL's:  Intact  Cognition: WNL  Sleep:          Has this patient used any form of tobacco in the last 30 days? (Cigarettes, Smokeless Tobacco, Cigars, and/or Pipes) No  Mental Status Per Nursing Assessment::   On Admission:     Current Mental Status by Physician: NA  Loss Factors: NA  Historical Factors: Impulsivity  Risk Reduction Factors:   Sense of responsibility to family, Living with another person, especially a relative, Positive social support and Positive therapeutic relationship  Continued Clinical Symptoms:  Mild anxiety  Cognitive Features That Contribute To Risk:  None    Suicide Risk:  Minimal: No identifiable suicidal ideation.  Patients presenting with no risk factors but with morbid ruminations; may be classified as minimal  risk based on the severity of the depressive symptoms  Principal Problem: Schizoaffective disorder, bipolar type Discharge Diagnoses:  Patient Active Problem List   Diagnosis Date Noted  . 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 Schizoaffective disorder, bipolar type [F25.0] 11/20/2012    Priority: High  . Dehydration [E86.0] 03/25/2014  . AKI (acute kidney injury) [N17.9] 03/25/2014  . Nausea with vomiting [R11.2] 03/25/2014  . Hypokalemia [E87.6] 03/25/2014      Plan Of Care/Follow-up recommendations:  Activity:  as tolerated Diet:  heart healthy diet  Is patient on multiple antipsychotic therapies at discharge:  No   Has Patient had three or more failed trials of antipsychotic monotherapy by history:  No  Recommended Plan for Multiple Antipsychotic Therapies: NA    Joselin Crandell, PMH-NP 09/02/2014, 2:49 PM

## 2014-09-02 NOTE — BH Assessment (Addendum)
Tele Assessment Note   [name] is a 29 y.o. male who voluntarily presents to [facility] with auditory and visual hallucinations with command to harm himself and others.  Pt denies SI/HI and states he has no plan or intent to harm himself or others. Pt says he is seeing the demons--"the hand of the devil", ghosts and leprechauns but keeps telling them "no". Pt says his medications no longer help to control the voices and thinks his medications need to be increased.  Pt has an ACTT team but was unable to verify with this Clinical research associate the name of his ACTT team, he also lives in a group home but he is also unable to provide the name of group home, only that he has been living there for 3 mos. Pt says he is feeling depressed because his mother died yesterday.  This Clinical research associate discussed interview with Donell Sievert, PA who requests AM psych eval for final disposition, since he has an ACTT team and outpatient services with psychiatry and has no lethality; pt may be able to continue with his outpatient services and have medications monitored and changed without inpt admission.  Axis I: Schizoaffective Disorder Axis II: Deferred Axis III:  Past Medical History  Diagnosis Date  . Bipolar 1 disorder   . Schizophrenia, acute   . Seizures   . Acid reflux   . Anxiety    Axis IV: other psychosocial or environmental problems, problems related to social environment and problems with primary support group Axis V: 31-40 impairment in reality testing  Past Medical History:  Past Medical History  Diagnosis Date  . Bipolar 1 disorder   . Schizophrenia, acute   . Seizures   . Acid reflux   . Anxiety     Past Surgical History  Procedure Laterality Date  . Abdominal surgery      Family History: History reviewed. No pertinent family history.  Social History:  reports that he has been smoking Cigarettes.  He has been smoking about 0.50 packs per day. He does not have any smokeless tobacco history on file. He reports  that he drinks alcohol. He reports that he does not use illicit drugs.  Additional Social History:  Alcohol / Drug Use Pain Medications: See MAR  Prescriptions: See MAR  Over the Counter: See MAR  History of alcohol / drug use?: No history of alcohol / drug abuse Longest period of sobriety (when/how long): None   CIWA: CIWA-Ar BP: 128/69 mmHg Pulse Rate: 86 COWS:    PATIENT STRENGTHS: (choose at least two) Communication skills Motivation for treatment/growth  Allergies:  Allergies  Allergen Reactions  . Peanut-Containing Drug Products   . Benadryl [Diphenhydramine Hcl] Other (See Comments)    Home Medications:  (Not in a hospital admission)  OB/GYN Status:  No LMP for male patient.  General Assessment Data Location of Assessment: [facility] TTS Assessment: In system Is this a Tele or Face-to-Face Assessment?: Tele Assessment Is this an Initial Assessment or a Re-assessment for this encounter?: Initial Assessment Marital status: Single Maiden name: None  Is patient pregnant?: No Pregnancy Status: No Living Arrangements: Group Home (Unable to verify group home name ) Can pt return to current living arrangement?: Yes Admission Status: Voluntary Is patient capable of signing voluntary admission?: Yes Referral Source: MD Insurance type: MCD  Medical Screening Exam [facility] ONLY) Medical Exam completed: No Reason for MSE not completed: Other: (None )  Crisis Care Plan Living Arrangements: Group Home (Unable to verify group home name )  Name of Psychiatrist: None  Name of Therapist: None   Education Status Is patient currently in school?: No Current Grade: None  Highest grade of school patient has completed: None  Name of school: None  Contact person: None   Risk to self with the past 6 months Suicidal Ideation: No Has patient been a risk to self within the past 6 months prior to admission? : No Suicidal Intent: No Has patient had any suicidal intent within  the past 6 months prior to admission? : No Is patient at risk for suicide?: No Suicidal Plan?: No Has patient had any suicidal plan within the past 6 months prior to admission? : No Access to Means: No What has been your use of drugs/alcohol within the last 12 months?: Denies  Previous Attempts/Gestures: No How many times?: 0 Other Self Harm Risks: None  Triggers for Past Attempts: None known Intentional Self Injurious Behavior: None Family Suicide History: No Recent stressful life event(s): Other (Comment), Loss (Comment) (Feels like medications no longer work to control the halluc ) Persecutory voices/beliefs?: No Depression: Yes Depression Symptoms: Loss of interest in usual pleasures, Feeling worthless/self pity Substance abuse history and/or treatment for substance abuse?: No Suicide prevention information given to non-admitted patients: Not applicable  Risk to Others within the past 6 months Homicidal Ideation: No Does patient have any lifetime risk of violence toward others beyond the six months prior to admission? : No Thoughts of Harm to Others: No Current Homicidal Intent: No Current Homicidal Plan: No Access to Homicidal Means: No Identified Victim: None  History of harm to others?: No Assessment of Violence: None Noted Violent Behavior Description: None  Does patient have access to weapons?: No Criminal Charges Pending?: No Does patient have a court date: No Is patient on probation?: No  Psychosis Hallucinations: Auditory, Visual, With command Delusions: None noted  Mental Status Report Appearance/Hygiene: Disheveled, In scrubs Eye Contact: Good Motor Activity: Unremarkable Speech: Logical/coherent, Slow Level of Consciousness: Alert Mood: Depressed Affect: Depressed, Appropriate to circumstance Anxiety Level: None Thought Processes: Coherent, Relevant Judgement: Impaired Orientation: Person, Place, Time, Situation Obsessive Compulsive  Thoughts/Behaviors: None  Cognitive Functioning Concentration: Normal Memory: Recent Intact, Remote Intact IQ: Average Insight: Fair Impulse Control: Fair Appetite: Good Weight Loss: 0 Weight Gain: 0 Sleep: No Change Total Hours of Sleep: 6 Vegetative Symptoms: None  ADLScreening [facility] Assessment Services) Patient's cognitive ability adequate to safely complete daily activities?: Yes Patient able to express need for assistance with ADLs?: Yes Independently performs ADLs?: Yes (appropriate for developmental age)  Prior Inpatient Therapy Prior Inpatient Therapy: No Prior Therapy Dates: None  Prior Therapy Facilty/Provider(s): None  Reason for Treatment: None   Prior Outpatient Therapy Prior Outpatient Therapy: Yes Prior Therapy Dates: Current  Prior Therapy Facilty/Provider(s): Reports an ACTT Team  Reason for Treatment: None  Does patient have an ACCT team?: Yes (Pt unable to provide the name of the company ) Does patient have Intensive In-House Services?  : No Does patient have [facility] services? : No Does patient have P4CC services?: No  ADL Screening (condition at time of admission) Patient's cognitive ability adequate to safely complete daily activities?: Yes Is the patient deaf or have difficulty hearing?: No Does the patient have difficulty seeing, even when wearing glasses/contacts?: No Does the patient have difficulty concentrating, remembering, or making decisions?: Yes Patient able to express need for assistance with ADLs?: Yes Does the patient have difficulty dressing or bathing?: No Independently performs ADLs?: Yes (appropriate for developmental age) Does the  patient have difficulty walking or climbing stairs?: No Weakness of Legs: None Weakness of Arms/Hands: None  Home Assistive Devices/Equipment Home Assistive Devices/Equipment: None  Therapy Consults (therapy consults require a physician order) PT Evaluation Needed: No OT Evalulation Needed: No SLP  Evaluation Needed: No Abuse/Neglect Assessment (Assessment to be complete while patient is alone) Physical Abuse: Denies Verbal Abuse: Denies Sexual Abuse: Denies Exploitation of patient/patient's resources: Denies Self-Neglect: Denies Values / Beliefs Cultural Requests During Hospitalization: None Spiritual Requests During Hospitalization: None Consults Spiritual Care Consult Needed: No Social Work Consult Needed: No Merchant navy officerAdvance Directives (For Healthcare) Does patient have an advance directive?: No Would patient like information on creating an advanced directive?: No - patient declined information    Additional Information 1:1 In Past 12 Months?: No CIRT Risk: No Elopement Risk: No Does patient have medical clearance?: Yes     Disposition:  Disposition Initial Assessment Completed for this Encounter: Yes Disposition of Patient: Referred to (Per Donell SievertSpencer Simon, PA req AM psych eval for final dispositio) Patient referred to:  (Per Donell SievertSpencer Simon, PA AM psych eval for final dispsition )  Murrell ReddenSimmons, Jazelle Achey C 09/02/2014 12:30 AM

## 2014-09-02 NOTE — ED Notes (Signed)
Tele psych in progress at this time. 

## 2014-09-02 NOTE — ED Notes (Signed)
Attempting to call [name] to set up pt transport home.

## 2014-09-02 NOTE — ED Notes (Signed)
[facility] contacted and informed pt needed ride back home and that pt was discharged.

## 2014-09-02 NOTE — ED Notes (Signed)
PT denies any SI/HI or hallucinations at this time.

## 2014-09-02 NOTE — ED Notes (Signed)
Pt provided juice, crackers & peanut butter.

## 2014-09-24 ENCOUNTER — Other Ambulatory Visit: Payer: Self-pay | Admitting: Psychiatry

## 2015-08-12 ENCOUNTER — Encounter: Payer: Self-pay | Admitting: Emergency Medicine

## 2015-08-12 ENCOUNTER — Emergency Department
Admission: EM | Admit: 2015-08-12 | Discharge: 2015-08-13 | Disposition: A | Discharge status: Home / Self Care | Payer: Medicaid Other | Attending: Emergency Medicine | Admitting: Emergency Medicine

## 2015-08-12 ENCOUNTER — Inpatient Hospital Stay: Admission: RE | Admit: 2015-08-12 | Payer: Medicaid Other | Source: Intra-hospital | Admitting: Psychiatry

## 2015-08-12 DIAGNOSIS — F3112 Bipolar disorder, current episode manic without psychotic features, moderate: Secondary | ICD-10-CM

## 2015-08-12 DIAGNOSIS — R000 Tachycardia, unspecified: Secondary | ICD-10-CM

## 2015-08-12 DIAGNOSIS — F3132 Bipolar disorder, current episode depressed, moderate: Secondary | ICD-10-CM | POA: Diagnosis not present

## 2015-08-12 DIAGNOSIS — I10 Essential (primary) hypertension: Secondary | ICD-10-CM | POA: Insufficient documentation

## 2015-08-12 DIAGNOSIS — N179 Acute kidney failure, unspecified: Secondary | ICD-10-CM | POA: Diagnosis not present

## 2015-08-12 DIAGNOSIS — F17200 Nicotine dependence, unspecified, uncomplicated: Secondary | ICD-10-CM

## 2015-08-12 DIAGNOSIS — E119 Type 2 diabetes mellitus without complications: Secondary | ICD-10-CM | POA: Insufficient documentation

## 2015-08-12 DIAGNOSIS — Z8673 Personal history of transient ischemic attack (TIA), and cerebral infarction without residual deficits: Secondary | ICD-10-CM | POA: Insufficient documentation

## 2015-08-12 DIAGNOSIS — F209 Schizophrenia, unspecified: Secondary | ICD-10-CM | POA: Diagnosis not present

## 2015-08-12 DIAGNOSIS — F250 Schizoaffective disorder, bipolar type: Secondary | ICD-10-CM | POA: Diagnosis not present

## 2015-08-12 DIAGNOSIS — F17210 Nicotine dependence, cigarettes, uncomplicated: Secondary | ICD-10-CM | POA: Insufficient documentation

## 2015-08-12 DIAGNOSIS — F918 Other conduct disorders: Secondary | ICD-10-CM | POA: Diagnosis present

## 2015-08-12 LAB — URINE DRUG SCREEN, QUALITATIVE (ARMC ONLY)
AMPHETAMINES, UR SCREEN: NOT DETECTED
BENZODIAZEPINE, UR SCRN: NOT DETECTED
Barbiturates, Ur Screen: NOT DETECTED
Cannabinoid 50 Ng, Ur ~~LOC~~: NOT DETECTED
Cocaine Metabolite,Ur ~~LOC~~: NOT DETECTED
MDMA (ECSTASY) UR SCREEN: NOT DETECTED
Methadone Scn, Ur: NOT DETECTED
Opiate, Ur Screen: NOT DETECTED
PHENCYCLIDINE (PCP) UR S: NOT DETECTED
Tricyclic, Ur Screen: NOT DETECTED

## 2015-08-12 LAB — COMPREHENSIVE METABOLIC PANEL
ALT: 29 U/L (ref 17–63)
ANION GAP: 6 (ref 5–15)
AST: 50 U/L — AB (ref 15–41)
Albumin: 4.8 g/dL (ref 3.5–5.0)
Alkaline Phosphatase: 62 U/L (ref 38–126)
BILIRUBIN TOTAL: 0.6 mg/dL (ref 0.3–1.2)
CHLORIDE: 108 mmol/L (ref 101–111)
CO2: 29 mmol/L (ref 22–32)
Calcium: 9.8 mg/dL (ref 8.9–10.3)
Creatinine, Ser: 0.93 mg/dL (ref 0.61–1.24)
GFR calc Af Amer: >60 mL/min (ref >60)
Glucose, Bld: 121 mg/dL — ABNORMAL HIGH (ref 65–99)
POTASSIUM: 3 mmol/L — AB (ref 3.5–5.1)
Sodium: 143 mmol/L (ref 135–145)
TOTAL PROTEIN: 7.6 g/dL (ref 6.5–8.1)

## 2015-08-12 LAB — CBC
HCT: 39.8 % — ABNORMAL LOW (ref 40.0–52.0)
Hemoglobin: 12.7 g/dL — ABNORMAL LOW (ref 13.0–18.0)
MCH: 27.2 pg (ref 26.0–34.0)
MCHC: 32 g/dL (ref 32.0–36.0)
MCV: 84.9 fL (ref 80.0–100.0)
PLATELETS: 255 10*3/uL (ref 150–440)
RBC: 4.69 MIL/uL (ref 4.40–5.90)
RDW: 13.6 % (ref 11.5–14.5)
WBC: 7.2 10*3/uL (ref 3.8–10.6)

## 2015-08-12 LAB — LITHIUM LEVEL: Lithium Lvl: 0.71 mmol/L (ref 0.60–1.20)

## 2015-08-12 LAB — SALICYLATE LEVEL: Salicylate Lvl: <4 mg/dL (ref 2.8–30.0)

## 2015-08-12 LAB — ETHANOL: Alcohol, Ethyl (B): <5 mg/dL (ref <5)

## 2015-08-12 LAB — ACETAMINOPHEN LEVEL

## 2015-08-12 LAB — GLUCOSE, CAPILLARY: GLUCOSE-CAPILLARY: 118 mg/dL — AB (ref 65–99)

## 2015-08-12 MED ORDER — CLOZAPINE 100 MG PO TABS
100.0000 mg | ORAL_TABLET | Freq: Two times a day (BID) | ORAL | Status: DC
  Administered 2015-08-12 – 2015-08-13 (×4): 100 mg via ORAL
  Filled 2015-08-12 (×5): qty 1
  Filled 2015-08-12: qty 4

## 2015-08-12 MED ORDER — POTASSIUM CHLORIDE CRYS ER 20 MEQ PO TBCR
EXTENDED_RELEASE_TABLET | ORAL | Status: AC
  Administered 2015-08-12: 20 meq
  Filled 2015-08-12: qty 1

## 2015-08-12 MED ORDER — METFORMIN HCL 500 MG PO TABS
500.0000 mg | ORAL_TABLET | Freq: Two times a day (BID) | ORAL | Status: DC
  Administered 2015-08-12 – 2015-08-13 (×3): 500 mg via ORAL
  Filled 2015-08-12 (×3): qty 1

## 2015-08-12 MED ORDER — LITHIUM CARBONATE 300 MG PO CAPS
ORAL_CAPSULE | ORAL | Status: AC
  Filled 2015-08-12: qty 2

## 2015-08-12 MED ORDER — DOCUSATE SODIUM 100 MG PO CAPS
100.0000 mg | ORAL_CAPSULE | Freq: Two times a day (BID) | ORAL | Status: DC
  Administered 2015-08-12 – 2015-08-13 (×2): 100 mg via ORAL
  Filled 2015-08-12 (×4): qty 1

## 2015-08-12 MED ORDER — LORAZEPAM 2 MG PO TABS
2.0000 mg | ORAL_TABLET | ORAL | Status: DC | PRN
  Administered 2015-08-13: 2 mg via ORAL
  Filled 2015-08-12: qty 1

## 2015-08-12 MED ORDER — METOPROLOL TARTRATE 25 MG PO TABS
25.0000 mg | ORAL_TABLET | Freq: Two times a day (BID) | ORAL | Status: DC
  Administered 2015-08-12: 25 mg via ORAL
  Filled 2015-08-12 (×2): qty 1

## 2015-08-12 MED ORDER — POTASSIUM CHLORIDE CRYS ER 20 MEQ PO TBCR
40.0000 meq | EXTENDED_RELEASE_TABLET | Freq: Once | ORAL | Status: AC
  Administered 2015-08-12: 40 meq via ORAL
  Filled 2015-08-12: qty 2

## 2015-08-12 MED ORDER — LITHIUM CARBONATE ER 300 MG PO TBCR
600.0000 mg | EXTENDED_RELEASE_TABLET | Freq: Two times a day (BID) | ORAL | Status: DC
  Administered 2015-08-12 – 2015-08-13 (×3): 600 mg via ORAL
  Filled 2015-08-12 (×5): qty 2

## 2015-08-12 NOTE — ED Notes (Addendum)
Patient states he feels like a pill is stuck in his throat, patient is speaking in full sentences and eating a cracker and drinking a water without difficulty.  Patient would like to hold off on taking Lithium until the pill goes down his throat.
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
## 2015-08-12 NOTE — BH Assessment (Signed)
Assessment Note  [name] is an 30 y.o. male who presents to the ER due to aggression at his [facility].  According to the Group Home Administrator Ortencia Kick [phone]), the patient been with them for approximately 3 weeks. Prior to that, he was inpatient with [facility] since 10/2014 until 06/2015. Upon discharged from the hospital, patient's medication was tapered down to three medications. Patient asked Group Home staff and his current outpatient provider for adjustments with his medications, "cause they wasn't working." He also told the staff he didn't want to go back to the hospital because he was there for a "long time but he needs his medicines. "  On today (08/12/2015), the patient was at his [facility] and became agitated. He yelled at some of the staff and walked away. After law enforcement and group home was called, the patient shared he was hearing voices and they were saying the other clients were talking about him. He was trying to ignore the voices but they were getting louder. When he started yelling and acting out, he felt it was better to walk away. Staff initially thought he was trying to run away but patient was trying to avoid any altercation and "problems, cause I don't want to go back to the hospital."  Patient currently receives outpatient treatment with [facility]. He is receives medication management and in their PSR program.  Patient currently denies SI/HI. His biggest fear is getting in trouble and having to return back to the hospital.   Group Home Administrator states, he is able to return. Also requesting he be put back on the same medications he was taking while inpatient with [facility]. "Just call me when he's ready and we will come pick him up."  Diagnosis: Schizoaffective, Bipolar Type    Past Medical History:  Past Medical History  Diagnosis Date  . Bipolar 1 disorder ([facility])   . Schizophrenia, acute  ([facility])   . Seizures ([facility])   . Acid reflux   . Anxiety     Past Surgical History  Procedure Laterality Date  . Abdominal surgery      Family History: No family history on file.  Social History:  reports that he has been smoking Cigarettes.  He has been smoking about 0.50 packs per day. He does not have any smokeless tobacco history on file. He reports that he drinks alcohol. He reports that he does not use illicit drugs.  Additional Social History:  Alcohol / Drug Use Pain Medications: See PTA Prescriptions: See PTA Over the Counter: See PTA History of alcohol / drug use?: No history of alcohol / drug abuse Longest period of sobriety (when/how long): Reports of no use Negative Consequences of Use:  (Reports of no use) Withdrawal Symptoms:  (Reports of no use)  CIWA: CIWA-Ar BP: 121/65 mmHg Pulse Rate: 81 COWS:    Allergies:  Allergies  Allergen Reactions  . Peanut-Containing Drug Products   . Benadryl [Diphenhydramine Hcl] Other (See Comments)    Home Medications:  (Not in a hospital admission)  OB/GYN Status:  No LMP for male patient.  General Assessment Data Location of Assessment: [facility] ED TTS Assessment: In system Is this a Tele or Face-to-Face Assessment?: Face-to-Face Is this an Initial Assessment or a Re-assessment for this encounter?: Initial Assessment Marital status: Single Maiden name: n/a Is patient pregnant?: No Pregnancy Status: No Living Arrangements: Group Home ([facility]) Can pt return to current living arrangement?: Yes Admission Status: Voluntary  Is patient capable of signing voluntary admission?: Yes Referral Source: Self/Family/Friend Insurance type: Medicaid  Medical Screening Exam [facility] Walk-in ONLY) Medical Exam completed: Yes  Crisis Care Plan Living Arrangements: Group Home ([facility]) Legal Guardian: Other: ([employer]) Name of Psychiatrist: Reports of none Name of Therapist: Reports of  none  Education Status Is patient currently in school?: No Current Grade: n/a Highest grade of school patient has completed: Unknown Name of school: n/a Contact person: n/a  Risk to self with the past 6 months Suicidal Ideation: No Has patient been a risk to self within the past 6 months prior to admission? : No Suicidal Intent: No Has patient had any suicidal intent within the past 6 months prior to admission? : No Is patient at risk for suicide?: No Suicidal Plan?: No Has patient had any suicidal plan within the past 6 months prior to admission? : No Access to Means: No What has been your use of drugs/alcohol within the last 12 months?: Reports of none Previous Attempts/Gestures: No How many times?: 0 Other Self Harm Risks: Reports of none Triggers for Past Attempts: None known Intentional Self Injurious Behavior: None Family Suicide History: Unknown Recent stressful life event(s): Other (Comment) (Recent change in his medication) Persecutory voices/beliefs?: No Depression: Yes Depression Symptoms: Feeling angry/irritable, Feeling worthless/self pity, Fatigue, Guilt Substance abuse history and/or treatment for substance abuse?: No Suicide prevention information given to non-admitted patients: Not applicable  Risk to Others within the past 6 months Homicidal Ideation: No Does patient have any lifetime risk of violence toward others beyond the six months prior to admission? : Yes (comment) Thoughts of Harm to Others: No-Not Currently Present/Within Last 6 Months Current Homicidal Intent: No Current Homicidal Plan: No Access to Homicidal Means: No Identified Victim: Reports of none History of harm to others?: Yes Assessment of Violence: In past 6-12 months Violent Behavior Description: Became upset while at the [facility] Does patient have access to weapons?: No Criminal Charges Pending?: No Does patient have a court date: No Is patient on probation?:  No  Psychosis Hallucinations: Auditory Delusions: Persecutory  Mental Status Report Appearance/Hygiene: Unremarkable, In scrubs, In hospital gown Eye Contact: Good Motor Activity: Freedom of movement, Unremarkable Speech: Logical/coherent, Unremarkable Level of Consciousness: Alert Mood: Anxious Affect: Appropriate to circumstance, Sad Anxiety Level: Minimal Thought Processes: Coherent, Relevant, Irrelevant Judgement: Partial Orientation: Person, Place, Time, Situation, Appropriate for developmental age Obsessive Compulsive Thoughts/Behaviors: Minimal  Cognitive Functioning Concentration: Decreased Memory: Recent Intact, Remote Intact IQ: Average Insight: Fair Impulse Control: Poor Appetite: Good Weight Loss: 0 Weight Gain: 0 Sleep: Decreased Total Hours of Sleep: 6 Vegetative Symptoms: None  ADLScreening [facility] Assessment Services) Patient's cognitive ability adequate to safely complete daily activities?: Yes Patient able to express need for assistance with ADLs?: Yes Independently performs ADLs?: Yes (appropriate for developmental age)  Prior Inpatient Therapy Prior Inpatient Therapy: Yes Prior Therapy Dates: 10/2014 till 06/2015, 05/2014 & 06/2012 Prior Therapy Facilty/Provider(s): [facility], [facility] Reason for Treatment: Schizoaffective, Bipolar  Prior Outpatient Therapy Prior Outpatient Therapy: Yes Prior Therapy Dates: Currently Prior Therapy Facilty/Provider(s): [place] Reason for Treatment: PSR Program Does patient have an ACCT team?: No Does patient have Intensive In-House Services?  : No Does patient have [facility] services? : No Does patient have P4CC services?: No  ADL Screening (condition at time of admission) Patient's cognitive ability adequate to safely complete daily activities?: Yes Is the patient deaf or have difficulty hearing?: No Does the patient have difficulty seeing,  even when wearing glasses/contacts?:  No Does the patient have difficulty concentrating, remembering, or making decisions?: No Patient able to express need for assistance with ADLs?: Yes Does the patient have difficulty dressing or bathing?: No Independently performs ADLs?: Yes (appropriate for developmental age) Does the patient have difficulty walking or climbing stairs?: No Weakness of Legs: None Weakness of Arms/Hands: None  Home Assistive Devices/Equipment Home Assistive Devices/Equipment: None  Therapy Consults (therapy consults require a physician order) PT Evaluation Needed: No OT Evalulation Needed: No SLP Evaluation Needed: No Abuse/Neglect Assessment (Assessment to be complete while patient is alone) Physical Abuse: Denies Verbal Abuse: Denies Sexual Abuse: Denies Exploitation of patient/patient's resources: Denies Self-Neglect: Denies Values / Beliefs Cultural Requests During Hospitalization: None Spiritual Requests During Hospitalization: None Consults Spiritual Care Consult Needed: No Social Work Consult Needed: No Merchant navy officerAdvance Directives (For Healthcare) Does patient have an advance directive?: No    Additional Information 1:1 In Past 12 Months?: Yes CIRT Risk: Yes Elopement Risk: No Does patient have medical clearance?: Yes  Child/Adolescent Assessment Running Away Risk: Denies (Patient is an adult)  Disposition:  Disposition Initial Assessment Completed for this Encounter: Yes Disposition of Patient: Other dispositions (ER MD ordered Psych Consult)  On Site Evaluation by:   Reviewed with Physician:     Lilyan Gilfordalvin J. Yi Falletta MS, LCAS, LPC, NCC, CCSI Therapeutic Triage Specialist 08/12/2015 6:14 PM

## 2015-08-12 NOTE — Consult Note (Signed)
  Psychiatry: Follow-up note for this patient evaluated earlier this afternoon. Reviewed the case with the inpatient team. Based on his prior behavior it is felt that he would be inappropriate and too dangerous for admission to the psychiatry ward. I have restarted him on his medication as previously documented with the exception that I have started him on a somewhat lower dose of clozapine than what he was on previously as I don't want to jump right into the highest dose. Nevertheless he should be able to tolerate restarting at a reasonable level of medicine. Patient is agreeable to the plan. We have reviewed this with the emergency room team. His group home is willing to take him back as soon as he is calm. If we can get him back on his medicine and get arrangements for an outpatient psychiatrist hopefully we can move him out of here and back into an outpatient setting as soon as possible.

## 2015-08-12 NOTE — ED Notes (Signed)
Patient took scheduled medication and His one time dose of potassium for k+ 3.0 Patient likes to be called [name], He is cooperative, Patient with q 15 min. Checks and camera monitoring in progress.

## 2015-08-12 NOTE — ED Notes (Signed)
Pt is asleep in bed this evening. Pt mood is sad and his affect is flat. Pt is somewhat lethargic, but alert and cooperative with staff. Writer encouraged fluids and provided food and drink. Pt is taking medications as prescribed and states that he is eager to return to his group home.15 minute checks are ongoing for safety.

## 2015-08-12 NOTE — ED Notes (Signed)
Patient is sitting in dayroom sleeping in recliner. No signs of any issues.

## 2015-08-12 NOTE — ED Notes (Signed)
Patient has been treated as an inpatient in a facility in [place]  From July 2016-July 23, 2015, when he was returned to [facility] for group home placement.

## 2015-08-12 NOTE — ED Notes (Signed)
Patient's supper tray given to him, Patient has been sleeping in the recliner in the day room, he has been cooperative, no behavior issues, He denies Si/hi or avh at this time. Patient with q 15 min. Checks and camera monitoring in progress.

## 2015-08-12 NOTE — ED Notes (Signed)
Pt to ed with c/o aggressive behavior today.  Pt states "I went off today"  Pt brought by caregiver from group home.

## 2015-08-12 NOTE — ED Provider Notes (Signed)
[facility] Emergency Department Provider Note   ____________________________________________  Time seen: Approximately 1:20 PM I have reviewed the triage vital signs and the triage nursing note.  HISTORY  Chief Complaint Aggressive Behavior   Historian Patient  HPI [name] is a 30 y.o. male with a history of bipolar, schizophrenia, who has recently been moved down to [place] after a long term placement in [place]. Patient states that he moved down here 3 weeks ago to a care home, and his medications did not transfer. He has therefore not had any medications in 3 weeks.  He states that he did get agitated this afternoon and aggressive.  Currently patient says that he is extremely happy and is giggling and laughing out loud. Denies suicidal ideation. He did reportedly state that he was hearing voices this morning. Symptoms are moderate.    Past Medical History  Diagnosis Date  . Bipolar 1 disorder (HCC)   . Schizophrenia, acute (HCC)   . Seizures (HCC)   . Acid reflux   . Anxiety     Patient Active Problem List   Diagnosis Date Noted  . Agitation 08/12/2015  . Hypertension 08/12/2015  . Diabetes (HCC) 08/12/2015  . Dehydration 03/25/2014  . AKI (acute kidney injury) (HCC) 03/25/2014  . Nausea with vomiting 03/25/2014  . Hypokalemia 03/25/2014  . 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 Schizoaffective disorder, bipolar type (HCC) 11/20/2012    Past Surgical History  Procedure Laterality Date  . Abdominal surgery      Current Outpatient Rx  Name  Route  Sig  Dispense  Refill  . cloZAPine (CLOZARIL) 100 MG tablet   Oral   Take 200 mg by mouth at bedtime. 1.5 tablets in the morning and 2 tablets at bedtime         . clozapine (CLOZARIL) 50 MG tablet   Oral   Take 50 mg by mouth 3 (three) times daily.         . fluticasone (FLONASE) 50 MCG/ACT nasal spray   Each Nare   Place 2 sprays into both  nostrils 2 (two) times daily.         [name] lithium carbonate 300 MG capsule      TAKE (2) CAPSULES BY MOUTH TWICE DAILY   120 capsule   0   . acetaminophen (TYLENOL) 325 MG tablet   Oral   Take 650 mg by mouth every 4 (four) hours as needed for mild pain or moderate pain.         [name] docusate sodium (COLACE) 100 MG capsule   Oral   Take 200 mg by mouth 2 (two) times daily.         [name] LORazepam (ATIVAN) 2 MG tablet   Oral   Take 2 mg by mouth every 4 (four) hours as needed for anxiety.         . metFORMIN (GLUCOPHAGE) 500 MG tablet   Oral   Take 500 mg by mouth 2 (two) times daily with a meal.         . metoprolol tartrate (LOPRESSOR) 25 MG tablet      TAKE 1 TABLET BY MOUTH TWICE DAILY   60 tablet   0   . OLANZapine (ZYPREXA) 10 MG tablet   Oral   Take 10 mg by mouth every 4 (four) hours as needed.         . Sennosides (SENNA-LAX PO)   Oral   Take 2 tablets by  mouth daily.           Allergies Peanut-containing drug products and Benadryl  No family history on file.  Social History Social History  Substance Use Topics  . Smoking status: Current Every Day Smoker -- 0.50 packs/day    Types: Cigarettes  . Smokeless tobacco: None  . Alcohol Use: Yes    Review of Systems  Constitutional: Negative for fever. Eyes: Negative for visual changes. ENT: Negative for sore throat. Cardiovascular: Negative for chest pain. Respiratory: Negative for shortness of breath. Gastrointestinal: Negative for abdominal pain, vomiting and diarrhea. Genitourinary: Negative for dysuria. Musculoskeletal: Negative for back pain. Skin: Negative for rash. Neurological: Negative for headache. 10 point Review of Systems otherwise negative ____________________________________________   PHYSICAL EXAM:  VITAL SIGNS: ED Triage Vitals  Enc Vitals Group     BP 08/12/15 1146 121/65 mmHg     Pulse Rate 08/12/15 1146 81     Resp 08/12/15 1146 18     Temp 08/12/15 1146 98.7 F  (37.1 C)     Temp Source 08/12/15 1146 Oral     SpO2 08/12/15 1146 100 %     Weight 08/12/15 1146 225 lb (102.059 kg)     Height 08/12/15 1146 6\' 5"  (1.956 m)     Head Cir --      Peak Flow --      Pain Score 08/12/15 1148 0     Pain Loc --      Pain Edu? --      Excl. in GC? --      Constitutional: Alert, Cooperative. Well appearing and in no distress. HEENT   Head: Normocephalic and atraumatic.      Eyes: Conjunctivae are normal. PERRL. Normal extraocular movements.      Ears:         Nose: No congestion/rhinnorhea.   Mouth/Throat: Mucous membranes are moist.   Neck: No stridor. Cardiovascular/Chest: Normal rate, regular rhythm.  No murmurs, rubs, or gallops. Respiratory: Normal respiratory effort without tachypnea nor retractions. Breath sounds are clear and equal bilaterally. No wheezes/rales/rhonchi. Gastrointestinal: Soft. No distention, no guarding, no rebound. Nontender.    Genitourinary/rectal:Deferred Musculoskeletal: Nontender with normal range of motion in all extremities. No joint effusions.  No lower extremity tenderness.  No edema. Neurologic:  Normal speech and language. No gross or focal neurologic deficits are appreciated. Skin:  Skin is warm, dry and intact. No rash noted. Psychiatric: Elevated mood. Slightly pressured speech. Cooperative. Redirectable for now. Reports auditory hallucinations. Denies suicidal or homicidal ideation.  ____________________________________________   EKG I, Governor Rooksebecca Sarinity Dicicco, MD, the attending physician have personally viewed and interpreted all ECGs.  None ____________________________________________  LABS (pertinent positives/negatives)  Comprehensive metabolic panel significant for potassium 3.0 is without significant abnormalities Alcohol less than 5 Tylenol and salicylate undetectable White blood count 7.2, hemoglobin 12.7, platelet count 255 Urine drug screen  negative  ____________________________________________  RADIOLOGY All Xrays were viewed by me. Imaging interpreted by Radiologist.  None __________________________________________  PROCEDURES  Procedure(s) performed: None  Critical Care performed: None  ____________________________________________   ED COURSE / ASSESSMENT AND PLAN  Pertinent labs & imaging results that were available during my care of the patient were reviewed by me and considered in my medical decision making (see chart for details).   I will have the psychiatry intake evaluate patient's current living situation and history regarding his medication use over the past 3 weeks.  Dr. Toni Amendlapacs with psychiatry was consulted.  Patient is having elevated mood, but he  is redirectable for now. Patient would move to the behavioral health holding unit for consultations and disposition.    CONSULTATIONS:   TTS, and psychiatrist.   Patient / Family / Caregiver informed of clinical course, medical decision-making process, and agree with plan.     ___________________________________________   FINAL CLINICAL IMPRESSION(S) / ED DIAGNOSES   Final diagnoses:  Bipolar 1 disorder with moderate mania (HCC)  Schizophrenia, unspecified type (HCC)              Note: This dictation was prepared with Dragon dictation. Any transcriptional errors that result from this process are unintentional   Governor Rooks, MD 08/12/15 1357

## 2015-08-13 ENCOUNTER — Encounter: Payer: Self-pay | Admitting: Psychiatry

## 2015-08-13 DIAGNOSIS — R000 Tachycardia, unspecified: Secondary | ICD-10-CM

## 2015-08-13 DIAGNOSIS — F250 Schizoaffective disorder, bipolar type: Secondary | ICD-10-CM | POA: Diagnosis not present

## 2015-08-13 DIAGNOSIS — F17200 Nicotine dependence, unspecified, uncomplicated: Secondary | ICD-10-CM

## 2015-08-13 LAB — CBC WITH DIFFERENTIAL/PLATELET
BASOS ABS: 0 10*3/uL (ref 0–0.1)
BASOS PCT: 1 %
EOS ABS: 0.2 10*3/uL (ref 0–0.7)
EOS PCT: 3 %
HEMATOCRIT: 39.3 % — AB (ref 40.0–52.0)
Hemoglobin: 12.5 g/dL — ABNORMAL LOW (ref 13.0–18.0)
Lymphocytes Relative: 28 %
Lymphs Abs: 2 10*3/uL (ref 1.0–3.6)
MCH: 26.9 pg (ref 26.0–34.0)
MCHC: 31.8 g/dL — AB (ref 32.0–36.0)
MCV: 84.4 fL (ref 80.0–100.0)
MONO ABS: 1 10*3/uL (ref 0.2–1.0)
MONOS PCT: 14 %
Neutro Abs: 3.7 10*3/uL (ref 1.4–6.5)
Neutrophils Relative %: 54 %
PLATELETS: 231 10*3/uL (ref 150–440)
RBC: 4.65 MIL/uL (ref 4.40–5.90)
RDW: 14.1 % (ref 11.5–14.5)
WBC: 6.9 10*3/uL (ref 3.8–10.6)

## 2015-08-13 LAB — GLUCOSE, CAPILLARY: GLUCOSE-CAPILLARY: 97 mg/dL (ref 65–99)

## 2015-08-13 MED ORDER — CLOZAPINE 100 MG PO TABS: 100.0000 mg | ORAL_TABLET | Freq: Two times a day (BID) | ORAL | Status: AC

## 2015-08-13 MED ORDER — DOCUSATE SODIUM 100 MG PO CAPS: 200.0000 mg | ORAL_CAPSULE | Freq: Two times a day (BID) | ORAL | Status: AC

## 2015-08-13 MED ORDER — LITHIUM CARBONATE ER 300 MG PO TBCR: 600.0000 mg | EXTENDED_RELEASE_TABLET | Freq: Two times a day (BID) | ORAL | Status: AC

## 2015-08-13 MED ORDER — METFORMIN HCL 500 MG PO TABS: 500.0000 mg | ORAL_TABLET | Freq: Two times a day (BID) | ORAL | Status: AC

## 2015-08-13 MED ORDER — FLUTICASONE PROPIONATE 50 MCG/ACT NA SUSP: 2.0000 | Freq: Two times a day (BID) | NASAL | Status: AC | PRN

## 2015-08-13 MED ORDER — METOPROLOL TARTRATE 25 MG PO TABS: 25.0000 mg | ORAL_TABLET | Freq: Two times a day (BID) | ORAL | Status: AC

## 2015-08-13 NOTE — ED Provider Notes (Signed)
Patient has been cleared for discharge by Dr. Ronelle NighHernandez  Jonathan E Williams, MD 08/13/15 1455

## 2015-08-13 NOTE — Progress Notes (Signed)
Psychiatry MD Progress Note  08/13/2015 2:58 PM [name]  MRN:  [id number] Subjective:  Patient with history of schizoaffective disorder and ADHD. He has a history of multiple psychiatric hospitalizations due to agitation and aggressive behavior.  Patient has been living at [facility] group home phone number [phone] for about a month.  Staff from the group home was contacted today by me and I tell me that the patient has been taking lithium but he has run out of all his other medications including Clozaril. He has not yet been [facility] at [facility] to have the psychiatrist to feel his medications. He tells me the patient was taking 150 mg) the morning and 200 mg of Clozaril at that time. He ran out of Clozaril about a week ago.  Patient was seen today. He denies having any issues or concerns. He denies having suicidality, homicidality or auditory visual hallucinations. He denies any side effects from medications. Denies any physical complaints. Per nursing staff he has been calm and cooperative here in the emergency department. He has been compliant with all his medications. He has not displayed any disruptive or agitated behavior here in the ER.  We plan to discharge the patient again today to his group home.  I have ordered weekly cbc and send an electronic order to [employer].  Staff from [facility] has been instructed to take pt for psychiatric evaluation at [facility] behavioral on Monday for a walk in.  Principal Problem: Schizoaffective disorder, bipolar type ([place]) Diagnosis:   Patient Active Problem List   Diagnosis Date Noted  . Tachycardia [R00.0] 08/13/2015  . Tobacco use disorder [F17.200] 08/13/2015  . Schizoaffective disorder, bipolar type ([place]) [F25.0] 11/20/2012   Total Time spent with patient: 1 hour  Past Psychiatric History:   Past Medical History:  Past Medical History  Diagnosis Date  . Bipolar 1 disorder ([place])   . Schizophrenia, acute ([place])   . Seizures  ([place])   . Acid reflux   . Anxiety     Past Surgical History  Procedure Laterality Date  . Abdominal surgery     Family History: History reviewed. No pertinent family history. Family Psychiatric  History:  Social History:  History  Alcohol Use  . Yes     History  Drug Use No    Social History   Social History  . Marital Status: Single    Spouse Name: N/A  . Number of Children: N/A  . Years of Education: N/A   Social History Main Topics  . Smoking status: Current Every Day Smoker -- 0.50 packs/day    Types: Cigarettes  . Smokeless tobacco: None  . Alcohol Use: Yes  . Drug Use: No  . Sexual Activity: No   Other Topics Concern  . None   Social History Narrative   Additional Social History:    Pain Medications: See PTA Prescriptions: See PTA Over the Counter: See PTA History of alcohol / drug use?: No history of alcohol / drug abuse Longest period of sobriety (when/how long): Reports of no use Negative Consequences of Use:  (Reports of no use) Withdrawal Symptoms:  (Reports of no use)         Current Medications: Current Facility-Administered Medications  Medication Dose Route Frequency Provider Last Rate Last Dose  . cloZAPine (CLOZARIL) tablet 100 mg  100 mg Oral BID Gonzella Lex, MD   100 mg at 08/13/15 0908  . docusate sodium (COLACE) capsule 100 mg  100 mg Oral BID Jenny Reichmann  T Clapacs, MD   100 mg at 08/13/15 0910  . lithium carbonate (LITHOBID) CR tablet 600 mg  600 mg Oral Q12H Gonzella Lex, MD   600 mg at 08/13/15 0910  . LORazepam (ATIVAN) tablet 2 mg  2 mg Oral Q4H PRN Gonzella Lex, MD   2 mg at 08/13/15 0910  . metFORMIN (GLUCOPHAGE) tablet 500 mg  500 mg Oral BID WC Gonzella Lex, MD   500 mg at 08/13/15 0911  . metoprolol tartrate (LOPRESSOR) tablet 25 mg  25 mg Oral BID Gonzella Lex, MD   25 mg at 08/12/15 1409   Current Outpatient Prescriptions  Medication Sig Dispense Refill  . cloZAPine (CLOZARIL) 100 MG tablet Take 200 mg by mouth at  bedtime.     . clozapine (CLOZARIL) 50 MG tablet Take 150 mg by mouth daily.     [name] lithium carbonate 300 MG capsule TAKE (2) CAPSULES BY MOUTH TWICE DAILY 120 capsule 0  . cloZAPine (CLOZARIL) 100 MG tablet Take 1 tablet (100 mg total) by mouth 2 (two) times daily. 60 tablet 0  . docusate sodium (COLACE) 100 MG capsule Take 2 capsules (200 mg total) by mouth 2 (two) times daily. 10 capsule 0  . fluticasone (FLONASE) 50 MCG/ACT nasal spray Place 2 sprays into both nostrils 2 (two) times daily as needed for allergies or rhinitis. 1 g 0  . lithium carbonate (LITHOBID) 300 MG CR tablet Take 2 tablets (600 mg total) by mouth every 12 (twelve) hours. 120 tablet 0  . metFORMIN (GLUCOPHAGE) 500 MG tablet Take 1 tablet (500 mg total) by mouth 2 (two) times daily with a meal. 60 tablet 0  . metoprolol tartrate (LOPRESSOR) 25 MG tablet Take 1 tablet (25 mg total) by mouth 2 (two) times daily. 30 tablet 0    Lab Results:  Results for orders placed or performed during the hospital encounter of 08/12/15 (from the past 48 hour(s))  Comprehensive metabolic panel     Status: Abnormal   Collection Time: 08/12/15 11:52 AM  Result Value Ref Range   Sodium 143 135 - 145 mmol/L   Potassium 3.0 (L) 3.5 - 5.1 mmol/L   Chloride 108 101 - 111 mmol/L   CO2 29 22 - 32 mmol/L   Glucose, Bld 121 (H) 65 - 99 mg/dL   BUN <5 (L) 6 - 20 mg/dL   Creatinine, Ser 0.93 0.61 - 1.24 mg/dL   Calcium 9.8 8.9 - 10.3 mg/dL   Total Protein 7.6 6.5 - 8.1 g/dL   Albumin 4.8 3.5 - 5.0 g/dL   AST 50 (H) 15 - 41 U/L   ALT 29 17 - 63 U/L   Alkaline Phosphatase 62 38 - 126 U/L   Total Bilirubin 0.6 0.3 - 1.2 mg/dL   GFR calc non Af Amer >60 >60 mL/min   GFR calc Af Amer >60 >60 mL/min    Comment: (NOTE) The eGFR has been calculated using the CKD EPI equation. This calculation has not been validated in all clinical situations. eGFR's persistently <60 mL/min signify possible Chronic Kidney Disease.    Anion gap 6 5 - 15  [name]  (ETOH)     Status: None   Collection Time: 08/12/15 11:52 AM  Result Value Ref Range   Alcohol, Ethyl (B) <5 <5 mg/dL    Comment:        LOWEST DETECTABLE LIMIT FOR SERUM ALCOHOL IS 5 mg/dL FOR MEDICAL PURPOSES ONLY   Salicylate level  Status: None   Collection Time: 08/12/15 11:52 AM  Result Value Ref Range   Salicylate Lvl <3.2 2.8 - 30.0 mg/dL  Acetaminophen level     Status: Abnormal   Collection Time: 08/12/15 11:52 AM  Result Value Ref Range   Acetaminophen (Tylenol), Serum <10 (L) 10 - 30 ug/mL    Comment:        THERAPEUTIC CONCENTRATIONS VARY SIGNIFICANTLY. A RANGE OF 10-30 ug/mL MAY BE AN EFFECTIVE CONCENTRATION FOR MANY PATIENTS. HOWEVER, SOME ARE BEST TREATED AT CONCENTRATIONS OUTSIDE THIS RANGE. ACETAMINOPHEN CONCENTRATIONS >150 ug/mL AT 4 HOURS AFTER INGESTION AND >50 ug/mL AT 12 HOURS AFTER INGESTION ARE OFTEN ASSOCIATED WITH TOXIC REACTIONS.   CBC     Status: Abnormal   Collection Time: 08/12/15 11:52 AM  Result Value Ref Range   WBC 7.2 3.8 - 10.6 K/uL   RBC 4.69 4.40 - 5.90 MIL/uL   Hemoglobin 12.7 (L) 13.0 - 18.0 g/dL   HCT 39.8 (L) 40.0 - 52.0 %   MCV 84.9 80.0 - 100.0 fL   MCH 27.2 26.0 - 34.0 pg   MCHC 32.0 32.0 - 36.0 g/dL   RDW 13.6 11.5 - 14.5 %   Platelets 255 150 - 440 K/uL  Urine Drug Screen, Qualitative ([facility] only)     Status: None   Collection Time: 08/12/15 11:52 AM  Result Value Ref Range   Tricyclic, Ur Screen NONE DETECTED NONE DETECTED   Amphetamines, Ur Screen NONE DETECTED NONE DETECTED   MDMA (Ecstasy)Ur Screen NONE DETECTED NONE DETECTED   Cocaine Metabolite,Ur [place] NONE DETECTED NONE DETECTED   Opiate, Ur Screen NONE DETECTED NONE DETECTED   Phencyclidine (PCP) Ur S NONE DETECTED NONE DETECTED   Cannabinoid 50 Ng, Ur [place] NONE DETECTED NONE DETECTED   Barbiturates, Ur Screen NONE DETECTED NONE DETECTED   Benzodiazepine, Ur Scrn NONE DETECTED NONE DETECTED   Methadone Scn, Ur NONE DETECTED NONE DETECTED    Comment:  (NOTE) 355  Tricyclics, urine               Cutoff 1000 ng/mL 200  Amphetamines, urine             Cutoff 1000 ng/mL 300  MDMA (Ecstasy), urine           Cutoff 500 ng/mL 400  Cocaine Metabolite, urine       Cutoff 300 ng/mL 500  Opiate, urine                   Cutoff 300 ng/mL 600  Phencyclidine (PCP), urine      Cutoff 25 ng/mL 700  Cannabinoid, urine              Cutoff 50 ng/mL 800  Barbiturates, urine             Cutoff 200 ng/mL 900  Benzodiazepine, urine           Cutoff 200 ng/mL 1000 Methadone, urine                Cutoff 300 ng/mL 1100 1200 The urine drug screen provides only a preliminary, unconfirmed 1300 analytical test result and should not be used for non-medical 1400 purposes. Clinical consideration and professional judgment should 1500 be applied to any positive drug screen result due to possible 1600 interfering substances. A more specific alternate chemical method 1700 must be used in order to obtain a confirmed analytical result.  1800 Gas chromato graphy / mass spectrometry (GC/MS) is the preferred 1900 confirmatory method.  Lithium level     Status: None   Collection Time: 08/12/15 11:52 AM  Result Value Ref Range   Lithium Lvl 0.71 0.60 - 1.20 mmol/L  Glucose, capillary     Status: Abnormal   Collection Time: 08/12/15  8:07 PM  Result Value Ref Range   Glucose-Capillary 118 (H) 65 - 99 mg/dL  Glucose, capillary     Status: None   Collection Time: 08/13/15  8:54 AM  Result Value Ref Range   Glucose-Capillary 97 65 - 99 mg/dL  CBC with Differential/Platelet     Status: Abnormal   Collection Time: 08/13/15 12:36 PM  Result Value Ref Range   WBC 6.9 3.8 - 10.6 K/uL   RBC 4.65 4.40 - 5.90 MIL/uL   Hemoglobin 12.5 (L) 13.0 - 18.0 g/dL   HCT 39.3 (L) 40.0 - 52.0 %   MCV 84.4 80.0 - 100.0 fL   MCH 26.9 26.0 - 34.0 pg   MCHC 31.8 (L) 32.0 - 36.0 g/dL   RDW 14.1 11.5 - 14.5 %   Platelets 231 150 - 440 K/uL   Neutrophils Relative % 54 %   Neutro Abs 3.7 1.4  - 6.5 K/uL   Lymphocytes Relative 28 %   Lymphs Abs 2.0 1.0 - 3.6 K/uL   Monocytes Relative 14 %   Monocytes Absolute 1.0 0.2 - 1.0 K/uL   Eosinophils Relative 3 %   Eosinophils Absolute 0.2 0 - 0.7 K/uL   Basophils Relative 1 %   Basophils Absolute 0.0 0 - 0.1 K/uL    Blood Alcohol level:  Lab Results  Component Value Date   ETH <5 08/12/2015   ETH <5 09/01/2014    Physical Findings: AIMS:  , ,  ,  ,    CIWA:    COWS:     Musculoskeletal: Strength & Muscle Tone: within normal limits Gait & Station: normal Patient leans: N/A  Psychiatric Specialty Exam: Review of Systems  Constitutional: Negative.   HENT: Negative.   Eyes: Negative.   Respiratory: Negative.   Cardiovascular: Negative.   Gastrointestinal: Negative.   Genitourinary: Negative.   Musculoskeletal: Negative.   Skin: Negative.   Neurological: Negative.   Endo/Heme/Allergies: Negative.   Psychiatric/Behavioral: Negative.     Blood pressure 100/56, pulse 69, temperature 98.1 F (36.7 C), temperature source Oral, resp. rate 18, height 6' 5"  (1.956 m), weight 102.059 kg (225 lb), SpO2 100 %.Body mass index is 26.68 kg/(m^2).  General Appearance: Fairly Groomed  Engineer, water::  Good  Speech:  Clear and Coherent  Volume:  Normal  Mood:  Euthymic  Affect:  Appropriate  Thought Process:  concrete  Orientation:  Full (Time, Place, and Person)  Thought Content:  Hallucinations: None  Suicidal Thoughts:  No  Homicidal Thoughts:  No  Memory:  Immediate;   Fair Recent;   Fair Remote;   Fair  Judgement:  Poor  Insight:  Shallow  Psychomotor Activity:  Normal  Concentration:  Fair  Recall:  [place]: Good  Akathisia:  No  Handed:    AIMS (if indicated):     Assets:  Agricultural consultant Housing Physical Health  ADL's:  Intact  Cognition: WNL  Sleep:      Treatment Plan Summary:  Schizoaffective disorder the patient will be discharged  on Clozaril 100 mg by mouth twice a day. The dose of Clozaril will need to be titrated up to his prior dose. Patient will be due for [place] on  April 28. Patient is to continue lithium carbonate CR 600 mg by mouth twice a day.  Metabolic syndrome prevention the patient is to continue metformin 500 mg by mouth twice a day  Tachycardia secondary to Clozaril the patient is to continue Lopressor 25 mg by mouth daily  Orders for CBC with differential were sent to [facility] patient will be due for [place] on April 28.  The staff have been provided with prescriptions for Clozaril, lithium, Lopressor and Glucophage for 30 days. They also have been provided with a copy of [place] drawn today.  Case has been discussed with ER physician Dr. Jimmye Norman and ER social worker Mrs Brennan Bailey.  This patient does not meet criteria for involuntary commitment or psychiatric inpatient hospitalization.  Most of the issues with this patient appeared to be behavioral in nature.     Hildred Priest, MD 08/13/2015, 2:58 PM

## 2015-08-13 NOTE — ED Notes (Signed)
Seen by Dr Hernandez 

## 2015-08-13 NOTE — Discharge Instructions (Signed)
Bipolar Disorder °Bipolar disorder is a mental illness. The term bipolar disorder actually is used to describe a group of disorders that all share varying degrees of emotional highs and lows that can interfere with daily functioning, such as work, school, or relationships. Bipolar disorder also can lead to drug abuse, hospitalization, and suicide. °The emotional highs of bipolar disorder are periods of elation or irritability and high energy. These highs can range from a mild form (hypomania) to a severe form (mania). People experiencing episodes of hypomania may appear energetic, excitable, and highly productive. People experiencing mania may behave impulsively or erratically. They often make poor decisions. They may have difficulty sleeping. The most severe episodes of mania can involve having very distorted beliefs or perceptions about the world and seeing or hearing things that are not real (psychotic delusions and hallucinations).  °The emotional lows of bipolar disorder (depression) also can range from mild to severe. Severe episodes of bipolar depression can involve psychotic delusions and hallucinations. °Sometimes people with bipolar disorder experience a state of mixed mood. Symptoms of hypomania or mania and depression are both present during this mixed-mood episode. °SIGNS AND SYMPTOMS °There are signs and symptoms of the episodes of hypomania and mania as well as the episodes of depression. The signs and symptoms of hypomania and mania are similar but vary in severity. They include: °· Inflated self-esteem or feeling of increased self-confidence. °· Decreased need for sleep. °· Unusual talkativeness (rapid or pressured speech) or the feeling of a need to keep talking. °· Sensation of racing thoughts or constant talking, with quick shifts between topics that may or may not be related (flight of ideas). °· Decreased ability to focus or concentrate. °· Increased purposeful activity, such as work, studies,  or social activity, or nonproductive activity, such as pacing, squirming and fidgeting, or finger and toe tapping. °· Impulsive behavior and use of poor judgment, resulting in high-risk activities, such as having unprotected sex or spending excessive amounts of money. °Signs and symptoms of depression include the following:  °· Feelings of sadness, hopelessness, or helplessness. °· Frequent or uncontrollable episodes of crying. °· Lack of feeling anything or caring about anything. °· Difficulty sleeping or sleeping too much.  °· Inability to enjoy the things you used to enjoy.   °· Desire to be alone all the time.   °· Feelings of guilt or worthlessness.  °· Lack of energy or motivation.   °· Difficulty concentrating, remembering, or making decisions.  °· Change in appetite or weight beyond normal fluctuations. °· Thoughts of death or the desire to harm yourself. °DIAGNOSIS  °Bipolar disorder is diagnosed through an assessment by your caregiver. Your caregiver will ask questions about your emotional episodes. There are two main types of bipolar disorder. People with type I bipolar disorder have manic episodes with or without depressive episodes. People with type II bipolar disorder have hypomanic episodes and major depressive episodes, which are more serious than mild depression. The type of bipolar disorder you have can make an important difference in how your illness is monitored and treated. °Your caregiver may ask questions about your medical history and use of alcohol or drugs, including prescription medication. Certain medical conditions and substances also can cause emotional highs and lows that resemble bipolar disorder (secondary bipolar disorder).  °TREATMENT  °Bipolar disorder is a long-term illness. It is best controlled with continuous treatment rather than treatment only when symptoms occur. The following treatments can be prescribed for bipolar disorders: °· Medication--Medication can be prescribed by  a doctor that   is an expert in treating mental disorders (psychiatrists). Medications called mood stabilizers are usually prescribed to help control the illness. Other medications are sometimes added if symptoms of mania, depression, or psychotic delusions and hallucinations occur despite the use of a mood stabilizer.  Talk therapy--Some forms of talk therapy are helpful in providing support, education, and guidance. A combination of medication and talk therapy is best for managing the disorder over time. A procedure in which electricity is applied to your brain through your scalp (electroconvulsive therapy) is used in cases of severe mania when medication and talk therapy do not work or work too slowly.   This information is not intended to replace advice given to you by your health care provider. Make sure you discuss any questions you have with your health care provider.   Document Released: 07/16/2000 Document Revised: 04/30/2014 Document Reviewed: 05/05/2012 Elsevier Interactive Patient Education 2016 [employer]  Schizophrenia Schizophrenia is a mental illness. It may cause disturbed or disorganized thinking, speech, or behavior. People with schizophrenia have problems functioning in one or more areas of life: work, school, home, or relationships. People with schizophrenia are at increased risk for suicide, certain chronic physical illnesses, and unhealthy behaviors, such as smoking and drug use. People who have family members with schizophrenia are at higher risk of developing the illness. Schizophrenia affects men and women equally but usually appears at an earlier age (teenage or early adult years) in men.  SYMPTOMS The earliest symptoms are often subtle (prodrome) and may go unnoticed until the illness becomes more severe (first-break psychosis). Symptoms of schizophrenia may be continuous or may come and go in severity. Episodes often are triggered by major life events, such as family  stress, college, [employer] service, marriage, pregnancy or child birth, divorce, or loss of a loved one. People with schizophrenia may see, hear, or feel things that do not exist (hallucinations). They may have false beliefs in spite of obvious proof to the contrary (delusions). Sometimes speech is incoherent or behavior is odd or withdrawn.  DIAGNOSIS Schizophrenia is diagnosed through an assessment by your caregiver. Your caregiver will ask questions about your thoughts, behavior, mood, and ability to function in daily life. Your caregiver may ask questions about your medical history and use of alcohol or drugs, including prescription medication. Your caregiver may also order blood tests and imaging exams. Certain medical conditions and substances can cause symptoms that resemble schizophrenia. Your caregiver may refer you to a mental health specialist for evaluation. There are three major criterion for a diagnosis of schizophrenia:  Two or more of the following five symptoms are present for a month or longer:  Delusions. Often the delusions are that you are being attacked, harassed, cheated, persecuted or conspired against (persecutory delusions).  Hallucinations.   Disorganized speech that does not make sense to others.  Grossly disorganized (confused or unfocused) behavior or extremely overactive or underactive motor activity (catatonia).  Negative symptoms such as bland or blunted emotions (flat affect), loss of will power (avolition), and withdrawal from social contacts (social isolation).  Level of functioning in one or more major areas of life (work, school, relationships, or self-care) is markedly below the level of functioning before the onset of illness.   There are continuous signs of illness (either mild symptoms or decreased level of functioning) for at least 6 months or longer. TREATMENT  Schizophrenia is a long-term illness. It is best controlled with continuous treatment rather  than treatment only when symptoms occur. The following treatments  are used to manage schizophrenia:  Medication--Medication is the most effective and important form of treatment for schizophrenia. Antipsychotic medications are usually prescribed to help manage schizophrenia. Other types of medication may be added to relieve any symptoms that may occur despite the use of antipsychotic medications.  Counseling or talk therapy--Individual, group, or family counseling may be helpful in providing education, support, and guidance. Many people with schizophrenia also benefit from social skills and job skills (vocational) training. A combination of medication and counseling is best for managing the disorder over time. A procedure in which electricity is applied to the brain through the scalp (electroconvulsive therapy) may be used to treat catatonic schizophrenia or schizophrenia in people who cannot take or do not respond to medication and counseling.   This information is not intended to replace advice given to you by your health care provider. Make sure you discuss any questions you have with your health care provider.   Document Released: 04/06/2000 Document Revised: 04/30/2014 Document Reviewed: 07/02/2012 Elsevier Interactive Patient Education [employer]

## 2015-08-13 NOTE — ED Notes (Signed)
Pt dc back to group home [facility]

## 2015-08-13 NOTE — ED Notes (Signed)
Called [facility]  Spoke to [place] for consult 1202

## 2015-11-26 IMAGING — US US ABDOMEN COMPLETE
1 series · 14 of 25 positions shown · non-contrast
Comparison: None.

CLINICAL DATA: 28-year-old male with abdominal pain, nausea and
vomiting for 1 day.

EXAM:
ULTRASOUND ABDOMEN COMPLETE

[Series 1: us abdomen complete · 0.24mm/px · 14 of 80 slices shown]
[im 1/80]
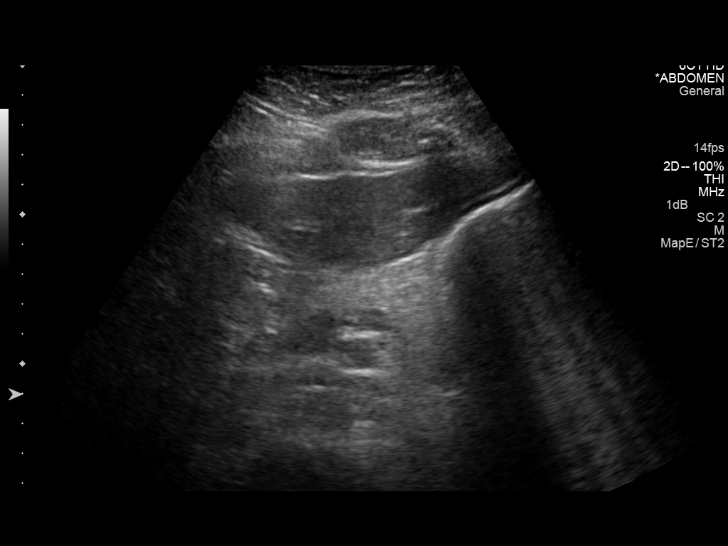
[im 7/80]
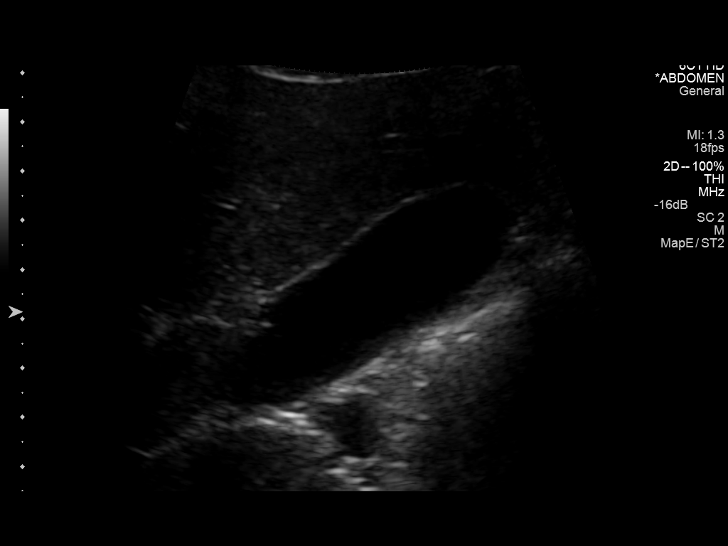
[im 14/80]
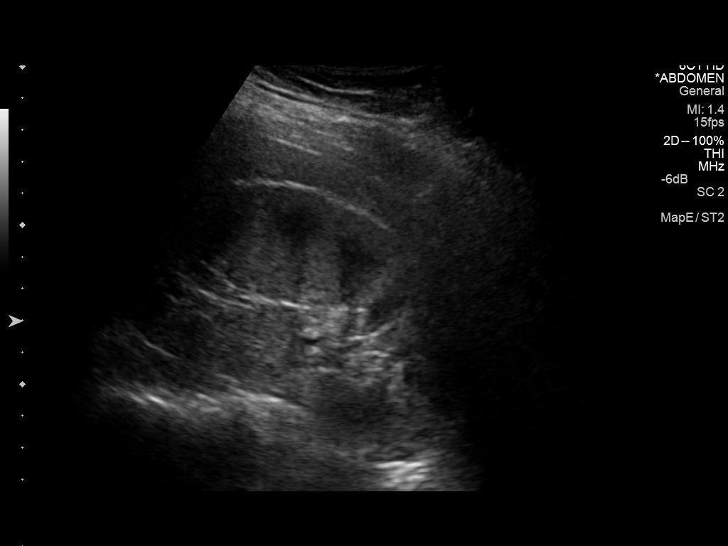
[im 20/80]
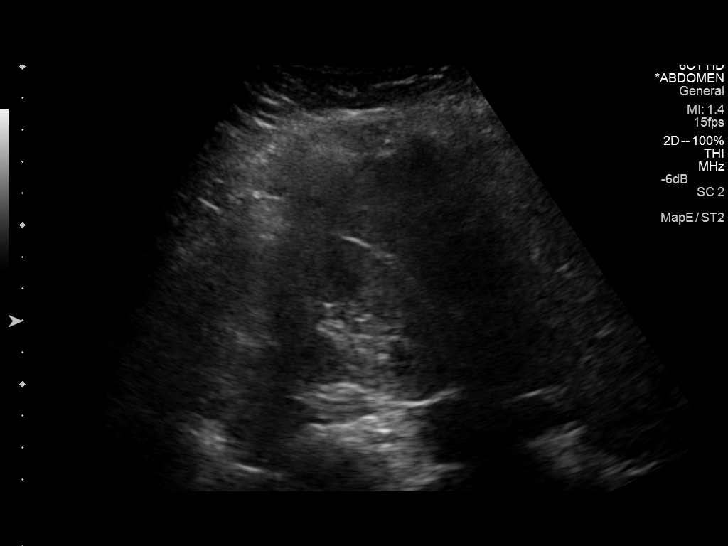
[im 27/80]
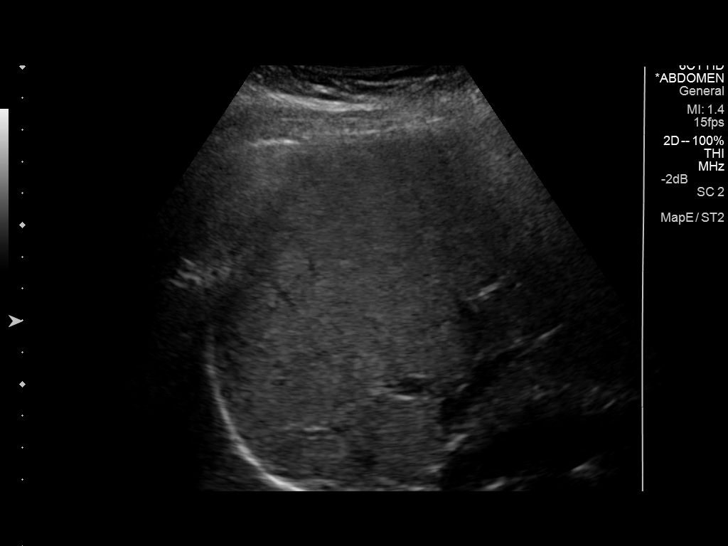
[im 30/80]
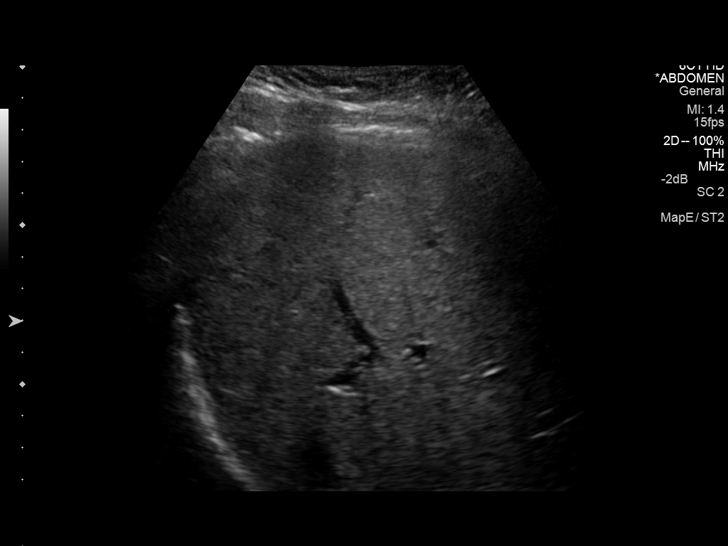
[im 37/80]
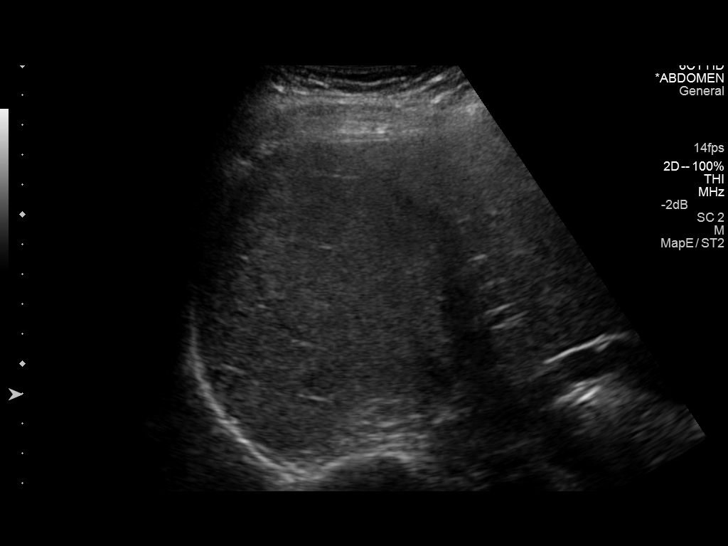
[im 43/80]
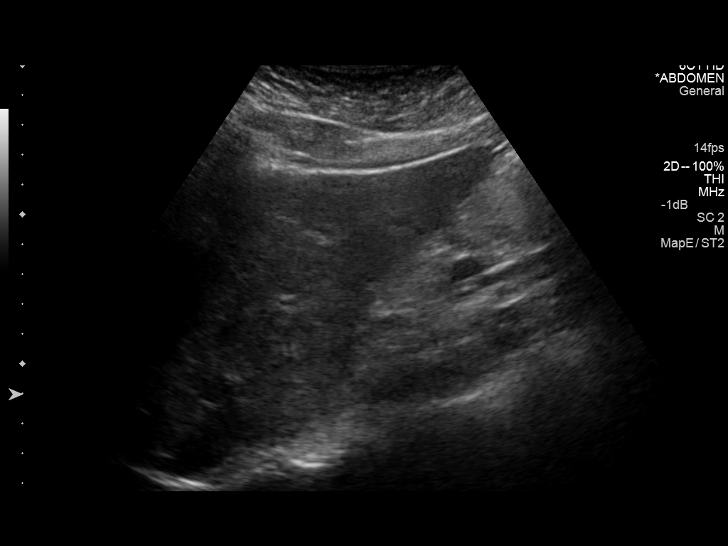
[im 50/80]
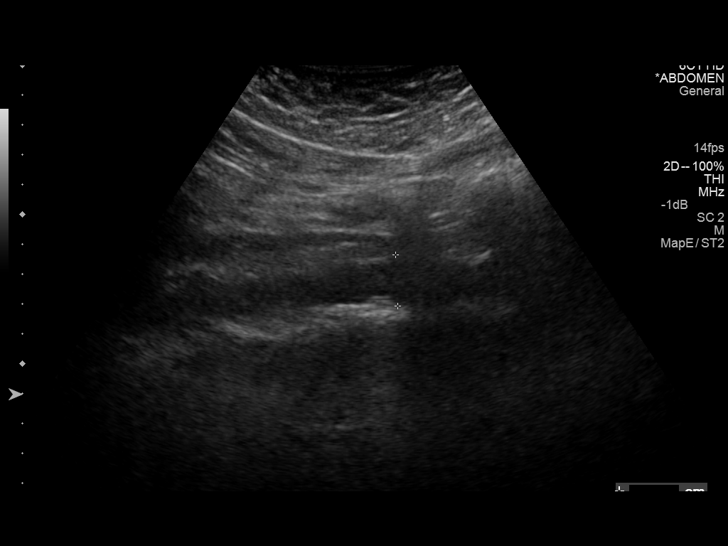
[im 53/80]
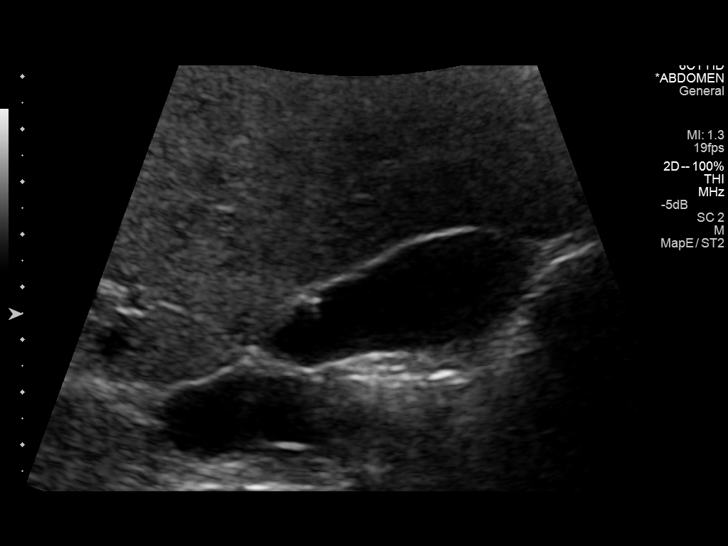
[im 60/80]
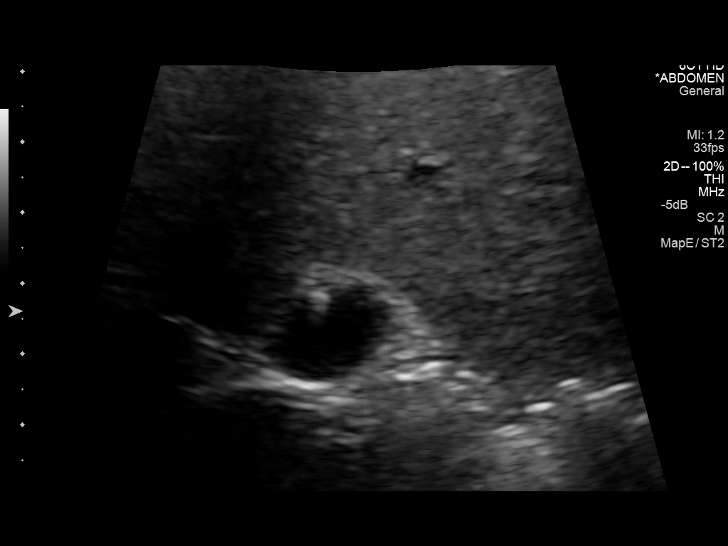
[im 66/80]
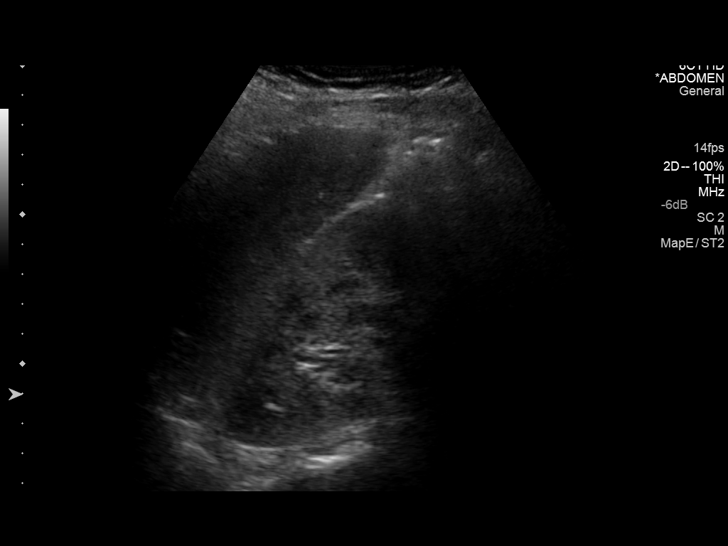
[im 73/80]
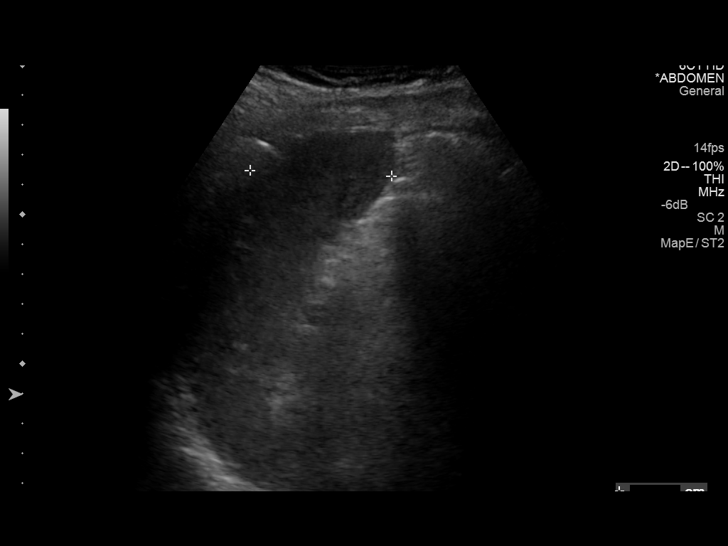
[im 80/80]
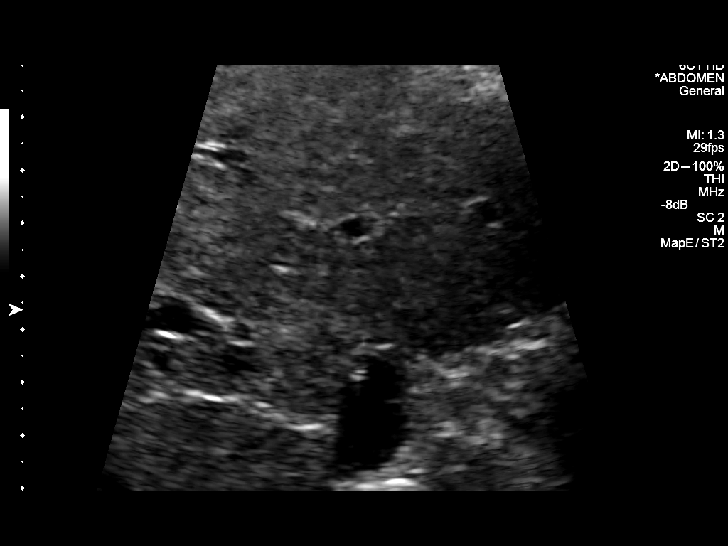

[14 of 25 positions shown; findings below may reference images not displayed]

FINDINGS: Gallbladder: A 4 mm gallbladder polyp is noted. There is no evidence
of cholelithiasis or acute cholecystitis.

Common bile duct: Diameter: 3.6 mm. There is no evidence of
intrahepatic or extrahepatic biliary dilatation.

Liver: Slightly heterogeneous echotexture without other abnormality.

IVC: No abnormality visualized.

Pancreas: Visualized portion unremarkable.

Spleen: Size and appearance within normal limits.

Right Kidney: Length: 12.5 cm. Echogenicity within normal limits. No
mass or hydronephrosis visualized.

Left Kidney: Length: 12.7 cm. Echogenicity within normal limits. No
mass or hydronephrosis visualized.

Abdominal aorta: No aneurysm visualized.

Other findings: None.
IMPRESSION: No evidence of acute abnormality.

4 mm gallbladder polyp without cholelithiasis or acute
cholecystitis.

Slightly heterogeneous hepatic echotexture without other hepatic
abnormality.

## 2016-03-07 ENCOUNTER — Emergency Department
Admission: EM | Admit: 2016-03-07 | Discharge: 2016-03-07 | Disposition: A | Discharge status: Home / Self Care | Payer: Medicaid Other | Attending: Emergency Medicine | Admitting: Emergency Medicine

## 2016-03-07 DIAGNOSIS — Z91010 Allergy to peanuts: Secondary | ICD-10-CM | POA: Diagnosis not present

## 2016-03-07 DIAGNOSIS — F17210 Nicotine dependence, cigarettes, uncomplicated: Secondary | ICD-10-CM | POA: Diagnosis not present

## 2016-03-07 DIAGNOSIS — J3489 Other specified disorders of nose and nasal sinuses: Secondary | ICD-10-CM | POA: Insufficient documentation

## 2016-03-07 DIAGNOSIS — R4689 Other symptoms and signs involving appearance and behavior: Secondary | ICD-10-CM

## 2016-03-07 DIAGNOSIS — F250 Schizoaffective disorder, bipolar type: Secondary | ICD-10-CM

## 2016-03-07 DIAGNOSIS — F918 Other conduct disorders: Secondary | ICD-10-CM | POA: Insufficient documentation

## 2016-03-07 DIAGNOSIS — F70 Mild intellectual disabilities: Secondary | ICD-10-CM

## 2016-03-07 DIAGNOSIS — Z7984 Long term (current) use of oral hypoglycemic drugs: Secondary | ICD-10-CM | POA: Insufficient documentation

## 2016-03-07 DIAGNOSIS — R441 Visual hallucinations: Secondary | ICD-10-CM | POA: Diagnosis not present

## 2016-03-07 DIAGNOSIS — R440 Auditory hallucinations: Secondary | ICD-10-CM | POA: Insufficient documentation

## 2016-03-07 DIAGNOSIS — R1110 Vomiting, unspecified: Secondary | ICD-10-CM

## 2016-03-07 DIAGNOSIS — Z046 Encounter for general psychiatric examination, requested by authority: Secondary | ICD-10-CM | POA: Diagnosis present

## 2016-03-07 DIAGNOSIS — Z79899 Other long term (current) drug therapy: Secondary | ICD-10-CM | POA: Diagnosis not present

## 2016-03-07 DIAGNOSIS — R443 Hallucinations, unspecified: Secondary | ICD-10-CM

## 2016-03-07 LAB — COMPREHENSIVE METABOLIC PANEL
ALBUMIN: 4.5 g/dL (ref 3.5–5.0)
ALK PHOS: 60 U/L (ref 38–126)
ALT: 15 U/L — AB (ref 17–63)
AST: 25 U/L (ref 15–41)
Anion gap: 8 (ref 5–15)
BILIRUBIN TOTAL: 0.8 mg/dL (ref 0.3–1.2)
BUN: 6 mg/dL (ref 6–20)
CALCIUM: 9.5 mg/dL (ref 8.9–10.3)
CO2: 29 mmol/L (ref 22–32)
CREATININE: 0.84 mg/dL (ref 0.61–1.24)
Chloride: 105 mmol/L (ref 101–111)
GFR calc Af Amer: >60 mL/min (ref >60)
GFR calc non Af Amer: >60 mL/min (ref >60)
GLUCOSE: 109 mg/dL — AB (ref 65–99)
Potassium: 3.4 mmol/L — ABNORMAL LOW (ref 3.5–5.1)
SODIUM: 142 mmol/L (ref 135–145)
TOTAL PROTEIN: 7.8 g/dL (ref 6.5–8.1)

## 2016-03-07 LAB — CBC
HCT: 44.4 % (ref 40.0–52.0)
Hemoglobin: 14.3 g/dL (ref 13.0–18.0)
MCH: 28.1 pg (ref 26.0–34.0)
MCHC: 32.3 g/dL (ref 32.0–36.0)
MCV: 86.9 fL (ref 80.0–100.0)
PLATELETS: 219 10*3/uL (ref 150–440)
RBC: 5.11 MIL/uL (ref 4.40–5.90)
RDW: 13 % (ref 11.5–14.5)
WBC: 4.9 10*3/uL (ref 3.8–10.6)

## 2016-03-07 LAB — URINE DRUG SCREEN, QUALITATIVE (ARMC ONLY)
Amphetamines, Ur Screen: NOT DETECTED
BARBITURATES, UR SCREEN: NOT DETECTED
Benzodiazepine, Ur Scrn: NOT DETECTED
CANNABINOID 50 NG, UR ~~LOC~~: NOT DETECTED
Cocaine Metabolite,Ur ~~LOC~~: NOT DETECTED
MDMA (ECSTASY) UR SCREEN: NOT DETECTED
Methadone Scn, Ur: NOT DETECTED
Opiate, Ur Screen: NOT DETECTED
PHENCYCLIDINE (PCP) UR S: NOT DETECTED
TRICYCLIC, UR SCREEN: NOT DETECTED

## 2016-03-07 LAB — ETHANOL: Alcohol, Ethyl (B): <5 mg/dL (ref <5)
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
## 2016-03-07 NOTE — BH Assessment (Signed)
Writer called and left another HIPPA Compliant message with Group Home Owner Ortencia Kick([place] [phone]), requesting a return phone call.
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
## 2016-03-07 NOTE — ED Notes (Signed)
Pt denies SI/HI/AVH. Pt and group home representative given discharge instructions. Both state understanding. Pt states receipt of all belongings.

## 2016-03-07 NOTE — ED Notes (Signed)
Group home is here to pick up patient. Preparing patient for discharge.

## 2016-03-07 NOTE — BH Assessment (Signed)
Writer called [facility] and received patient's current med list. Copy placed on patient's paper chart.

## 2016-03-07 NOTE — ED Notes (Signed)
Patient here after being discharged from jail. Denies SI or HI. Patient at baseline. Here voluntarily.
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
## 2016-03-07 NOTE — BH Assessment (Signed)
Writer called and left a HIPPA Compliant message with Group Home Owner Ortencia Kick([place] [phone]), requesting a return phone call.
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
## 2016-03-07 NOTE — ED Provider Notes (Signed)
Psychiatry consult note reviewed. Patient's medically and psychiatrically stable. Calm and comfortable with normal vital signs. [facility]'s group home plans to get him to take him home. This is congruent with Dr. Toni Amendlapacs plan, that the patient does not require commitment or hospitalization at this time and appears to be at his baseline and should be discharged to the group home if able. No further recommendations at this time. The patient sees [facility] and we will advise them to follow up with [facility] tomorrow.   Sharman CheekPhillip Skyllar Notarianni, MD 03/07/16 [phone]

## 2016-04-24 ENCOUNTER — Encounter: Payer: Self-pay | Admitting: Emergency Medicine

## 2016-04-24 ENCOUNTER — Emergency Department
Admission: EM | Admit: 2016-04-24 | Discharge: 2016-05-18 | Disposition: A | Discharge status: Other Acute Inpatient Hospital | Payer: Medicare Other | Attending: Emergency Medicine | Admitting: Emergency Medicine

## 2016-04-24 DIAGNOSIS — R4689 Other symptoms and signs involving appearance and behavior: Secondary | ICD-10-CM

## 2016-04-24 DIAGNOSIS — Z91010 Allergy to peanuts: Secondary | ICD-10-CM | POA: Diagnosis not present

## 2016-04-24 DIAGNOSIS — Z7984 Long term (current) use of oral hypoglycemic drugs: Secondary | ICD-10-CM | POA: Insufficient documentation

## 2016-04-24 DIAGNOSIS — Z79899 Other long term (current) drug therapy: Secondary | ICD-10-CM | POA: Insufficient documentation

## 2016-04-24 DIAGNOSIS — F061 Catatonic disorder due to known physiological condition: Secondary | ICD-10-CM

## 2016-04-24 DIAGNOSIS — F250 Schizoaffective disorder, bipolar type: Secondary | ICD-10-CM | POA: Diagnosis not present

## 2016-04-24 DIAGNOSIS — F919 Conduct disorder, unspecified: Secondary | ICD-10-CM | POA: Diagnosis present

## 2016-04-24 DIAGNOSIS — F17210 Nicotine dependence, cigarettes, uncomplicated: Secondary | ICD-10-CM | POA: Insufficient documentation

## 2016-04-24 DIAGNOSIS — F70 Mild intellectual disabilities: Secondary | ICD-10-CM | POA: Diagnosis present

## 2016-04-24 LAB — DIFFERENTIAL
BASOS PCT: 0 %
Basophils Absolute: 0 10*3/uL (ref 0–0.1)
EOS ABS: 0.2 10*3/uL (ref 0–0.7)
Eosinophils Relative: 2 %
LYMPHS ABS: 2.1 10*3/uL (ref 1.0–3.6)
Lymphocytes Relative: 20 %
MONO ABS: 1.4 10*3/uL — AB (ref 0.2–1.0)
MONOS PCT: 14 %
Neutro Abs: 6.8 10*3/uL — ABNORMAL HIGH (ref 1.4–6.5)
Neutrophils Relative %: 64 %

## 2016-04-24 LAB — COMPREHENSIVE METABOLIC PANEL
ALBUMIN: 4.5 g/dL (ref 3.5–5.0)
ALK PHOS: 76 U/L (ref 38–126)
ALT: 46 U/L (ref 17–63)
AST: 103 U/L — AB (ref 15–41)
Anion gap: 12 (ref 5–15)
BILIRUBIN TOTAL: 1.3 mg/dL — AB (ref 0.3–1.2)
BUN: 32 mg/dL — AB (ref 6–20)
CALCIUM: 10.2 mg/dL (ref 8.9–10.3)
CO2: 27 mmol/L (ref 22–32)
Chloride: 107 mmol/L (ref 101–111)
Creatinine, Ser: 0.89 mg/dL (ref 0.61–1.24)
GFR calc Af Amer: >60 mL/min (ref >60)
GFR calc non Af Amer: >60 mL/min (ref >60)
GLUCOSE: 107 mg/dL — AB (ref 65–99)
Potassium: 3.1 mmol/L — ABNORMAL LOW (ref 3.5–5.1)
Sodium: 146 mmol/L — ABNORMAL HIGH (ref 135–145)
TOTAL PROTEIN: 8.8 g/dL — AB (ref 6.5–8.1)

## 2016-04-24 LAB — CBC
HEMATOCRIT: 46.5 % (ref 40.0–52.0)
HEMOGLOBIN: 15.1 g/dL (ref 13.0–18.0)
MCH: 28.7 pg (ref 26.0–34.0)
MCHC: 32.5 g/dL (ref 32.0–36.0)
MCV: 88.2 fL (ref 80.0–100.0)
Platelets: 369 10*3/uL (ref 150–440)
RBC: 5.27 MIL/uL (ref 4.40–5.90)
RDW: 13.8 % (ref 11.5–14.5)
WBC: 10.8 10*3/uL — ABNORMAL HIGH (ref 3.8–10.6)

## 2016-04-24 LAB — SALICYLATE LEVEL: Salicylate Lvl: <7 mg/dL (ref 2.8–30.0)

## 2016-04-24 LAB — ETHANOL: Alcohol, Ethyl (B): <5 mg/dL (ref <5)

## 2016-04-24 LAB — ACETAMINOPHEN LEVEL: Acetaminophen (Tylenol), Serum: <10 ug/mL — ABNORMAL LOW (ref 10–30)

## 2016-04-24 MED ORDER — LORAZEPAM 2 MG/ML IJ SOLN
2.0000 mg | INTRAMUSCULAR | Status: DC
  Administered 2016-04-24 – 2016-04-25 (×5): 2 mg via INTRAVENOUS
  Filled 2016-04-24 (×5): qty 1

## 2016-04-24 MED ORDER — LITHIUM CARBONATE 300 MG PO CAPS
300.0000 mg | ORAL_CAPSULE | Freq: Two times a day (BID) | ORAL | Status: DC
  Administered 2016-04-25 – 2016-05-17 (×44): 300 mg via ORAL
  Filled 2016-04-24 (×44): qty 1

## 2016-04-24 MED ORDER — CLOZAPINE 100 MG PO TABS
100.0000 mg | ORAL_TABLET | Freq: Two times a day (BID) | ORAL | Status: DC
  Administered 2016-04-24 – 2016-05-17 (×45): 100 mg via ORAL
  Filled 2016-04-24 (×11): qty 1
  Filled 2016-04-24: qty 4
  Filled 2016-04-24: qty 1
  Filled 2016-04-24: qty 4
  Filled 2016-04-24 (×4): qty 1
  Filled 2016-04-24: qty 4
  Filled 2016-04-24 (×5): qty 1
  Filled 2016-04-24: qty 4
  Filled 2016-04-24 (×10): qty 1
  Filled 2016-04-24: qty 4
  Filled 2016-04-24 (×4): qty 1
  Filled 2016-04-24: qty 4
  Filled 2016-04-24 (×4): qty 1

## 2016-04-24 MED ORDER — LORAZEPAM 2 MG/ML IJ SOLN
2.0000 mg | Freq: Once | INTRAMUSCULAR | Status: AC
  Administered 2016-04-24: 2 mg via INTRAMUSCULAR
  Filled 2016-04-24: qty 1

## 2016-04-24 MED ORDER — METOPROLOL TARTRATE 25 MG PO TABS
25.0000 mg | ORAL_TABLET | Freq: Two times a day (BID) | ORAL | Status: DC
  Administered 2016-04-24 – 2016-05-17 (×45): 25 mg via ORAL
  Filled 2016-04-24 (×46): qty 1

## 2016-04-24 MED ORDER — SODIUM CHLORIDE 0.9 % IV BOLUS (SEPSIS)
1000.0000 mL | Freq: Once | INTRAVENOUS | Status: AC
  Administered 2016-04-24: 1000 mL via INTRAVENOUS

## 2016-04-24 MED ORDER — LORAZEPAM 2 MG/ML IJ SOLN: 1.0000 mg | Freq: Once | INTRAMUSCULAR | Status: DC
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
## 2016-04-24 NOTE — ED Provider Notes (Signed)
[facility] Emergency Department Provider Note ____________________________________   I have reviewed the triage vital signs and the nursing notes.   HISTORY  Chief Complaint Failure To Thrive and Mental Health Problem   History limited by: Psychosis   HPI [name] is a 31 y.o. male who presents to the emergency department today under IVC from jail because of concern for abnormal behavior. The patient has been off his meds for a number of days. He has not been eating or drinking. He has been becoming more aggressive towards other people. They have also noticed that he has been more paranoid and has been responding to internal stimuli. The patient will respond very minimally. Denies any pain.    Past Medical History:  Diagnosis Date  . Acid reflux   . Anxiety   . Bipolar 1 disorder ([facility])   . Schizophrenia, acute ([facility])   . Seizures [facility])     Patient Active Problem List   Diagnosis Date Noted  . 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 Tachycardia 08/13/2015  . Tobacco use disorder 08/13/2015  . Schizoaffective disorder, bipolar type ([facility]) 11/20/2012    Past Surgical History:  Procedure Laterality Date  . ABDOMINAL SURGERY      Prior to Admission medications   Medication Sig Start Date End Date Taking? Authorizing Provider  cloZAPine (CLOZARIL) 100 MG tablet Take 1 tablet (100 mg total) by mouth 2 (two) times daily. 08/13/15   Jimmy Footman, MD  docusate sodium (COLACE) 100 MG capsule Take 2 capsules (200 mg total) by mouth 2 (two) times daily. 08/13/15   Jimmy Footman, MD  fluticasone (FLONASE) 50 MCG/ACT nasal spray Place 2 sprays into both nostrils 2 (two) times daily as needed for allergies or rhinitis. 08/13/15   Jimmy Footman, MD  lithium carbonate (LITHOBID) 300 MG CR tablet Take 2 tablets (600 mg total) by mouth every 12 (twelve) hours. 08/13/15   Jimmy Footman, MD  metFORMIN (GLUCOPHAGE)  500 MG tablet Take 1 tablet (500 mg total) by mouth 2 (two) times daily with a meal. 08/13/15   Jimmy Footman, MD  metoprolol tartrate (LOPRESSOR) 25 MG tablet Take 1 tablet (25 mg total) by mouth 2 (two) times daily. 08/13/15   Jimmy Footman, MD    Allergies Peanut-containing drug products and Benadryl [diphenhydramine hcl]  No family history on file.  Social History Social History  Substance Use Topics  . Smoking status: Current Every Day Smoker    Packs/day: 0.50    Types: Cigarettes  . Smokeless tobacco: Not on file  . Alcohol use Yes    Review of Systems  Constitutional: Negative for fever. Cardiovascular: Negative for chest pain. Respiratory: Negative for shortness of breath. Gastrointestinal: Negative for abdominal pain, vomiting and diarrhea. Neurological: Negative for headaches, focal weakness or numbness.  10-point ROS otherwise negative.  ____________________________________________   PHYSICAL EXAM:  VITAL SIGNS: ED Triage Vitals [04/24/16 1626]  Enc Vitals Group     BP 157/76     Pulse 86     Resp 17     Temp 97.9     Temp src      SpO2 100     Weight (!) 315 lb (142.9 kg)     Height 6' (1.829 m)   Constitutional: Awake, alert, slightly talkative and interactive.  Eyes: Conjunctivae are normal. Normal extraocular movements. ENT   Head: Normocephalic and atraumatic.   Nose: No congestion/rhinnorhea.   Mouth/Throat: Mucous membranes are moist.   Neck: No stridor. Hematological/Lymphatic/Immunilogical: No  cervical lymphadenopathy. Cardiovascular: Normal rate, regular rhythm.  No murmurs, rubs, or gallops.  Respiratory: Normal respiratory effort without tachypnea nor retractions. Breath sounds are clear and equal bilaterally. No wheezes/rales/rhonchi. Gastrointestinal: Soft and non tender. No rebound. No guarding.  Genitourinary: Deferred Musculoskeletal: Normal range of motion in all extremities. No lower extremity  edema. Neurologic:  Slightly interactive, some repetitive motion with the upper extremities. Will answer some questions with limited response. Skin:  Skin is warm, dry and intact. No rash noted.   ____________________________________________    LABS (pertinent positives/negatives)  Labs Reviewed  COMPREHENSIVE METABOLIC PANEL - Abnormal; Notable for the following:       Result Value   Sodium 146 (*)    Potassium 3.1 (*)    Glucose, Bld 107 (*)    BUN 32 (*)    Total Protein 8.8 (*)    AST 103 (*)    Total Bilirubin 1.3 (*)    All other components within normal limits  ACETAMINOPHEN LEVEL - Abnormal; Notable for the following:    Acetaminophen (Tylenol), Serum <10 (*)    All other components within normal limits  CBC - Abnormal; Notable for the following:    WBC 10.8 (*)    All other components within normal limits  DIFFERENTIAL - Abnormal; Notable for the following:    Neutro Abs 6.8 (*)    Monocytes Absolute 1.4 (*)    All other components within normal limits  [name]  SALICYLATE LEVEL  URINE DRUG SCREEN, QUALITATIVE ([facility] ONLY)     ____________________________________________   EKG  None  ____________________________________________    RADIOLOGY  None  ____________________________________________   PROCEDURES  Procedures  ____________________________________________   INITIAL IMPRESSION / ASSESSMENT AND PLAN / ED COURSE  Pertinent labs & imaging results that were available during my care of the patient were reviewed by me and considered in my medical decision making (see chart for details).  Patient presented to the emergency department today under IVC after being in jail. Patient initially was for me somewhat interactive and talkative. [facility] They did make some recommendations however Dr. Toni Amendlapacs with psychiatry did evaluate the patient in person. He has seen this patient in the past.  He did put in for some medication orders. The patient will be watched here in the emergency department and reevaluated by psychiatry.  ____________________________________________   FINAL CLINICAL IMPRESSION(S) / ED DIAGNOSES  Final diagnoses:  Abnormal behavior     Note: This dictation was prepared with Dragon dictation. Any transcriptional errors that result from this process are unintentional     Phineas SemenGraydon Beckam Abdulaziz, MD 04/24/16 2124

## 2016-04-24 NOTE — ED Notes (Signed)
Pt. Resting comfortably in bed. Was asked need to urinate, denied, provided remote when responded he wanted to watch tv. Denied wanting to eat dinner.   Pt. Communicating by nodding head yes or no.

## 2016-04-24 NOTE — ED Notes (Signed)
Patient is resting comfortably at this time with no signs of distress present. Will continue to monitor.   

## 2016-04-24 NOTE — ED Notes (Signed)
Note from jail placed on chart.

## 2016-04-24 NOTE — ED Triage Notes (Addendum)
[facility] Per Officer Lowe patient was released under unsecured bond. Patient brought here d/t failure to thrive. Per officer patient has not had anything to eat or drink in "weeks". Patient has be voiding and defecating on himself and refusing medications. Patient refusing to speak only a few words in triage. Patient mouthing words but this RN in unable to comprehend what patient is attempting to communicate. Patient laughing to himself.

## 2016-04-24 NOTE — Consult Note (Signed)
Pt ordered clozapine. This is home med. Pt is registered and eligible per [facility] website. Weekly labs while inpatient. Last last 1/2. Submitted to [facility]. Next lab 05/01/16  Riley FlossMelissa D Eliot Bencivenga, Pharm.D, BCPS Clinical Pharmacist

## 2016-04-24 NOTE — ED Notes (Signed)
Pt. Received additional juice at this time as requested.

## 2016-04-24 NOTE — ED Notes (Signed)
Pt wiped down with bath wipes due to dried urine.

## 2016-04-24 NOTE — ED Notes (Signed)
Patient is resting comfortably at this time with no signs of distress present. VS stable. Will continue to monitor.   

## 2016-04-24 NOTE — ED Notes (Signed)
SOC at bedside. 

## 2016-04-24 NOTE — ED Notes (Signed)
Pt. Communicating in full sentences at this time. Requesting juice, provided.

## 2016-04-25 DIAGNOSIS — F250 Schizoaffective disorder, bipolar type: Secondary | ICD-10-CM | POA: Diagnosis not present

## 2016-04-25 MED ORDER — LORAZEPAM 2 MG/ML IJ SOLN
1.0000 mg | Freq: Four times a day (QID) | INTRAMUSCULAR | Status: DC
  Administered 2016-04-26: 1 mg via INTRAVENOUS
  Filled 2016-04-25: qty 1

## 2016-04-25 MED ORDER — LORAZEPAM 2 MG/ML IJ SOLN: 1.0000 mg | Freq: Four times a day (QID) | INTRAMUSCULAR | Status: DC

## 2016-04-25 NOTE — ED Notes (Signed)
Gave food tray and milk.

## 2016-04-25 NOTE — ED Notes (Signed)
Patient opening eyes and nodding head to respond to the nurses at this time.  Patient moved to rm 21.

## 2016-04-25 NOTE — ED Notes (Addendum)
Spoke with Dr. Lenard LancePaduchowski to inform him that the patient is still not responding to anything but pain stimuli.  Explained that he has had no food or water since this morning with the previous RN.  Per MD, hold lithium PO medication and Ativan at 1645.  Suggested rechecking basic blood work due to lack of fluid intake, per MD hold off on basic blood work at this time.

## 2016-04-25 NOTE — ED Notes (Signed)
Pt stood at bedside with help and used urinal

## 2016-04-25 NOTE — ED Notes (Signed)
Patient is resting comfortably at this time with no signs of distress present. VS stable. Will continue to monitor.   

## 2016-04-25 NOTE — ED Notes (Signed)
Pt. Unable to provide urine

## 2016-04-25 NOTE — ED Notes (Signed)
Pt. Encouraged to drink and take nightly medication.  Pt. Took a couple sips of water.  Pt. Not alert enough to take night time medication.

## 2016-04-25 NOTE — Consult Note (Signed)
[facility] Consult   Reason for Consult:  Consult for 31 year old man brought from jail with unresponsiveness Referring Physician:  Archie Balboa Patient Identification: Niam Nepomuceno MRN:  [id number] Principal Diagnosis: Schizoaffective disorder, bipolar type [facility]) Diagnosis:   Patient Active Problem List   Diagnosis Date Noted  . 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 Tachycardia [R00.0] 08/13/2015  . Tobacco use disorder [F17.200] 08/13/2015  . Schizoaffective disorder, bipolar type ([place]) [F25.0] 11/20/2012    Total Time spent with patient: 1 hour  Subjective:   Kinney Sackmann is a 31 y.o. male patient admitted with patient didn't make any statement to me.  HPI:  Patient interviewed and observe. Chart reviewed. Case discussed with emergency room physician TTS and nursing. 31 year old man with a history of schizoaffective disorder was brought from jail. The officers from jail reported that the patient has not been eating or taking his medicine for several days. Patient was covered with urine and feces when he came into the hospital. He was fairly unresponsive. Apparently he did speak a little bit to the emergency room doctor but did not speak at all when I came in to see him. He stared at me and shook his head slightly but not in a way that allowed for any kind of real communication. Labs reviewed. His bilirubin is up a little bit nothing else really dramatic. I don't see a urine back yet. Don't have any more information about what might of recently gone on with him.  Social history: This is a patient with chronic mental illness who has had hospitalizations several times in the past and has been here for extended periods area he has a guardian and I believe and has had diagnoses of schizoaffective disorder. Has been aggressive in the past and very disorganized in his thinking at times.  Medical history: Overweight and has some glucose  intolerance. Some high blood pressure and tachycardia probably related to his medicine.  Substance abuse history: Not a major primary problem. Has occasionally gotten some substance abuse when not supervised.  Past Psychiatric History: Patient is well known to [place] and usually presents as hyperactive and manic-like. Seems to have some degree of developmental disability but is usually able to carry on a fluid conversation. Has been on clozapine and lithium most recently as his medication.  Risk to Self: Is patient at risk for suicide?:  (Unable to assess at this time.) Risk to Others:   Prior Inpatient Therapy:   Prior Outpatient Therapy:    Past Medical History:  Past Medical History:  Diagnosis Date  . Acid reflux   . Anxiety   . Bipolar 1 disorder ([place])   . Schizophrenia, acute ([place])   . Seizures ([place])     Past Surgical History:  Procedure Laterality Date  . ABDOMINAL SURGERY     Family History: No family history on file. Family Psychiatric  History: None is identified Social History:  History  Alcohol Use  . Yes     History  Drug Use No    Social History   Social History  . Marital status: Single    Spouse name: N/A  . Number of children: N/A  . Years of education: N/A   Social History Main Topics  . Smoking status: Current Every Day Smoker    Packs/day: 0.50    Types: Cigarettes  . Smokeless tobacco: None  . Alcohol use Yes  . Drug use: No  . Sexual activity: No  Other Topics Concern  . None   Social History Narrative  . None   Additional Social History:    Allergies:   Allergies  Allergen Reactions  . Peanut-Containing Drug Products   . Benadryl [Diphenhydramine Hcl] Other (See Comments)    Labs:  Results for orders placed or performed during the hospital encounter of 04/24/16 (from the past 48 hour(s))  Comprehensive metabolic panel     Status: Abnormal   Collection Time: 04/24/16  4:23 PM  Result Value Ref Range   Sodium 146 (H) 135 - 145  mmol/L   Potassium 3.1 (L) 3.5 - 5.1 mmol/L   Chloride 107 101 - 111 mmol/L   CO2 27 22 - 32 mmol/L   Glucose, Bld 107 (H) 65 - 99 mg/dL   BUN 32 (H) 6 - 20 mg/dL   Creatinine, Ser 0.89 0.61 - 1.24 mg/dL   Calcium 10.2 8.9 - 10.3 mg/dL   Total Protein 8.8 (H) 6.5 - 8.1 g/dL   Albumin 4.5 3.5 - 5.0 g/dL   AST 103 (H) 15 - 41 U/L   ALT 46 17 - 63 U/L   Alkaline Phosphatase 76 38 - 126 U/L   Total Bilirubin 1.3 (H) 0.3 - 1.2 mg/dL   GFR calc non Af Amer >60 >60 mL/min   GFR calc Af Amer >60 >60 mL/min    Comment: (NOTE) The eGFR has been calculated using the CKD EPI equation. This calculation has not been validated in all clinical situations. eGFR's persistently <60 mL/min signify possible Chronic Kidney Disease.    Anion gap 12 5 - 15  [name]     Status: None   Collection Time: 04/24/16  4:23 PM  Result Value Ref Range   Alcohol, Ethyl (B) <5 <5 mg/dL    Comment:        LOWEST DETECTABLE LIMIT FOR SERUM ALCOHOL IS 5 mg/dL FOR MEDICAL PURPOSES ONLY   Salicylate level     Status: None   Collection Time: 04/24/16  4:23 PM  Result Value Ref Range   Salicylate Lvl <7.0 2.8 - 30.0 mg/dL  Acetaminophen level     Status: Abnormal   Collection Time: 04/24/16  4:23 PM  Result Value Ref Range   Acetaminophen (Tylenol), Serum <10 (L) 10 - 30 ug/mL    Comment:        THERAPEUTIC CONCENTRATIONS VARY SIGNIFICANTLY. A RANGE OF 10-30 ug/mL MAY BE AN EFFECTIVE CONCENTRATION FOR MANY PATIENTS. HOWEVER, SOME ARE BEST TREATED AT CONCENTRATIONS OUTSIDE THIS RANGE. ACETAMINOPHEN CONCENTRATIONS >150 ug/mL AT 4 HOURS AFTER INGESTION AND >50 ug/mL AT 12 HOURS AFTER INGESTION ARE OFTEN ASSOCIATED WITH TOXIC REACTIONS.   cbc     Status: Abnormal   Collection Time: 04/24/16  4:23 PM  Result Value Ref Range   WBC 10.8 (H) 3.8 - 10.6 K/uL   RBC 5.27 4.40 - 5.90 MIL/uL   Hemoglobin 15.1 13.0 - 18.0 g/dL   HCT 46.5 40.0 - 52.0 %   MCV 88.2 80.0 - 100.0 fL   MCH 28.7 26.0 - 34.0 pg    MCHC 32.5 32.0 - 36.0 g/dL   RDW 13.8 11.5 - 14.5 %   Platelets 369 150 - 440 K/uL  Differential     Status: Abnormal   Collection Time: 04/24/16  4:23 PM  Result Value Ref Range   Neutrophils Relative % 64 %   Neutro Abs 6.8 (H) 1.4 - 6.5 K/uL   Lymphocytes Relative 20 %   Lymphs Abs 2.1 1.0 -  3.6 K/uL   Monocytes Relative 14 %   Monocytes Absolute 1.4 (H) 0.2 - 1.0 K/uL   Eosinophils Relative 2 %   Eosinophils Absolute 0.2 0 - 0.7 K/uL   Basophils Relative 0 %   Basophils Absolute 0.0 0 - 0.1 K/uL    Current Facility-Administered Medications  Medication Dose Route Frequency Provider Last Rate Last Dose  . cloZAPine (CLOZARIL) tablet 100 mg  100 mg Oral BID Gonzella Lex, MD   100 mg at 04/25/16 0956  . lithium carbonate capsule 300 mg  300 mg Oral BID WC Gonzella Lex, MD   300 mg at 04/25/16 0817  . LORazepam (ATIVAN) injection 1 mg  1 mg Intramuscular Q6H Gonzella Lex, MD      . metoprolol tartrate (LOPRESSOR) tablet 25 mg  25 mg Oral BID Gonzella Lex, MD   25 mg at 04/25/16 5501   Current Outpatient Prescriptions  Medication Sig Dispense Refill  . cloZAPine (CLOZARIL) 100 MG tablet Take 1 tablet (100 mg total) by mouth 2 (two) times daily. 60 tablet 0  . docusate sodium (COLACE) 100 MG capsule Take 2 capsules (200 mg total) by mouth 2 (two) times daily. 10 capsule 0  . fluticasone (FLONASE) 50 MCG/ACT nasal spray Place 2 sprays into both nostrils 2 (two) times daily as needed for allergies or rhinitis. 1 g 0  . lithium carbonate (LITHOBID) 300 MG CR tablet Take 2 tablets (600 mg total) by mouth every 12 (twelve) hours. 120 tablet 0  . metFORMIN (GLUCOPHAGE) 500 MG tablet Take 1 tablet (500 mg total) by mouth 2 (two) times daily with a meal. 60 tablet 0  . metoprolol tartrate (LOPRESSOR) 25 MG tablet Take 1 tablet (25 mg total) by mouth 2 (two) times daily. 30 tablet 0    Musculoskeletal: Strength & Muscle Tone: decreased Gait & Station: unable to stand Patient leans:  N/A  Psychiatric Specialty Exam: Physical Exam  Nursing note and vitals reviewed. Constitutional: He appears well-developed and well-nourished.  HENT:  Head: Normocephalic and atraumatic.  Eyes: Conjunctivae are normal. Pupils are equal, round, and reactive to light.  Neck: Normal range of motion.  Cardiovascular: Regular rhythm and normal heart sounds.   Respiratory: Effort normal. No respiratory distress.  GI: Soft.  Musculoskeletal: Normal range of motion.  Neurological: He is alert.  Skin: Skin is warm and dry.  Psychiatric: His affect is blunt. His speech is delayed. He is slowed. Cognition and memory are impaired.    Review of Systems  Unable to perform ROS: Patient nonverbal    Blood pressure 124/77, pulse 83, temperature 97.9 F (36.6 C), temperature source Oral, resp. rate 16, height 6' (1.829 m), weight (!) 142.9 kg (315 lb), SpO2 100 %.Body mass index is 42.72 kg/m.  General Appearance: Disheveled  Eye Contact:  Minimal  Speech:  Negative  Volume:  Decreased  Mood:  Negative  Affect:  Negative  Thought Process:  NA  Orientation:  Negative  Thought Content:  Negative  Suicidal Thoughts:  No  Homicidal Thoughts:  No  Memory:  Negative  Judgement:  Negative  Insight:  Negative  Psychomotor Activity:  Decreased  Concentration:  Concentration: Poor  Recall:  Negative  Fund of Knowledge:  Negative  Language:  Negative  Akathisia:  Negative  Handed:  Right  AIMS (if indicated):     Assets:  Housing Physical Health Resilience  ADL's:  Impaired  Cognition:  Impaired,  Mild  Sleep:  Treatment Plan Summary: Daily contact with patient to assess and evaluate symptoms and progress in treatment, Medication management and Plan Patient seen today. He was unarousable today. Seemed to be oversedated. Staff in the emergency room tells me that he got up briefly and went to the bathroom but otherwise has been in bed. Possibly the plan to break the catatonia with  Ativan is excessive. I will decrease the Ativan dose to 1 mg intramuscular every 6 hours instead. Continue other psychiatric medicine as ordered. Patient currently still not able to be admitted to the psychiatric unit. Continued daily monitoring.  Disposition: Supportive therapy provided about ongoing stressors.  Alethia Berthold, MD 04/25/2016 3:39 PM

## 2016-04-25 NOTE — ED Notes (Signed)
Pt asked for sprite, orange juice and something to eat. Gave sprite, orange juice and sandwich tray to pt.

## 2016-04-25 NOTE — ED Notes (Signed)
Pt urinated on self, given clean scrubs to change in to.

## 2016-04-25 NOTE — ED Notes (Signed)
Spoke with Dr. Pershing ProudSchaevitz in regards to giving patient the ordered 2 mg Ativan.  Explained that the patient has been sleeping since I received him as a patient.  Patient is still having pain response but that is it as far as responding to me.  MD explained the ativan will help bring him out of the catatonic state. Per his order, Ativan given to patient.

## 2016-04-25 NOTE — ED Notes (Signed)
Patient still sleeping comfortably at this time.  Patient grunts when attempted to be woken up.

## 2016-04-25 NOTE — ED Notes (Signed)
Gave new blanket.

## 2016-04-25 NOTE — ED Provider Notes (Signed)
-----------------------------------------   7:18 AM on 04/25/2016 -----------------------------------------   Blood pressure 129/79, pulse 76, temperature 97.9 F (36.6 C), temperature source Oral, resp. rate 14, height 6' (1.829 m), weight (!) 315 lb (142.9 kg), SpO2 99 %.  The patient had no acute events since last update.  Calm and cooperative at this time.  Disposition is pending Psychiatry/Behavioral Medicine team recommendations.     Irean HongJade J Sung, MD 04/25/16 [phone]

## 2016-04-25 NOTE — ED Notes (Signed)
Lunch tray placed in pt room. Pt sleeping 

## 2016-04-25 NOTE — ED Notes (Signed)
Pt awake in bed , took medications without any difficulty

## 2016-04-25 NOTE — ED Notes (Signed)
Pt given supper tray.

## 2016-04-26 DIAGNOSIS — F250 Schizoaffective disorder, bipolar type: Secondary | ICD-10-CM | POA: Diagnosis not present

## 2016-04-26 LAB — URINE DRUG SCREEN, QUALITATIVE (ARMC ONLY)
Amphetamines, Ur Screen: NOT DETECTED
BARBITURATES, UR SCREEN: NOT DETECTED
Benzodiazepine, Ur Scrn: POSITIVE — AB
CANNABINOID 50 NG, UR ~~LOC~~: NOT DETECTED
COCAINE METABOLITE, UR ~~LOC~~: NOT DETECTED
MDMA (ECSTASY) UR SCREEN: NOT DETECTED
Methadone Scn, Ur: NOT DETECTED
Opiate, Ur Screen: NOT DETECTED
Phencyclidine (PCP) Ur S: NOT DETECTED
TRICYCLIC, UR SCREEN: NOT DETECTED

## 2016-04-26 MED ORDER — LORAZEPAM 2 MG/ML IJ SOLN
1.0000 mg | Freq: Four times a day (QID) | INTRAMUSCULAR | Status: DC
  Administered 2016-04-27 – 2016-04-28 (×6): 1 mg via INTRAMUSCULAR
  Filled 2016-04-26 (×6): qty 1

## 2016-04-26 MED ORDER — ZIPRASIDONE MESYLATE 20 MG IM SOLR
20.0000 mg | Freq: Once | INTRAMUSCULAR | Status: AC
  Administered 2016-04-26: 20 mg via INTRAMUSCULAR
  Filled 2016-04-26: qty 20

## 2016-04-26 NOTE — ED Notes (Signed)
Pt urinated on self and bed. Changed pt bed linens and assisted pt in bathroom to change into clean burgundy scrubs.

## 2016-04-26 NOTE — Consult Note (Signed)
[facility] Face-to-Face Psychiatry Consult   Reason for Consult:  Consult for 31 year old man brought from jail with unresponsiveness Referring Physician:  Derrill Kay Patient Identification: [name] MRN:  [id number] Principal Diagnosis: Schizoaffective disorder, bipolar type [facility]) Diagnosis:   Patient Active Problem List   Diagnosis Date Noted  . 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 Tachycardia [R00.0] 08/13/2015  . Tobacco use disorder [F17.200] 08/13/2015  . Schizoaffective disorder, bipolar type ([facility]) [F25.0] 11/20/2012    Total Time spent with patient: 20 minutes  Subjective:   [name] is a 31 y.o. male patient admitted with patient didn't make any statement to me.  Follow-up for Thursday the fourth. Patient seen chart reviewed. Patient is significantly different today. He was awake and alert. He was able to interact with me. Able to answer some simple questions rationally although his thoughts were still very easily distracted and confused. He was mostly focused on dancing in his room. Made a few jokes some of them that made sense. Giggly. Not aggressive. Taking his medicine.  HPI:  Patient interviewed and observe. Chart reviewed. Case discussed with emergency room physician TTS and nursing. 31 year old man with a history of schizoaffective disorder was brought from jail. The officers from jail reported that the patient has not been eating or taking his medicine for several days. Patient was covered with urine and feces when he came into the hospital. He was fairly unresponsive. Apparently he did speak a little bit to the emergency room doctor but did not speak at all when I came in to see him. He stared at me and shook his head slightly but not in a way that allowed for any kind of real communication. Labs reviewed. His bilirubin is up a little bit nothing else really dramatic. I don't see a urine back yet. Don't have any more information  about what might of recently gone on with him.  Social history: This is a patient with chronic mental illness who has had hospitalizations several times in the past and has been here for extended periods area he has a guardian and I believe and has had diagnoses of schizoaffective disorder. Has been aggressive in the past and very disorganized in his thinking at times.  Medical history: Overweight and has some glucose intolerance. Some high blood pressure and tachycardia probably related to his medicine.  Substance abuse history: Not a major primary problem. Has occasionally gotten some substance abuse when not supervised.  Past Psychiatric History: Patient is well known to [place] and usually presents as hyperactive and manic-like. Seems to have some degree of developmental disability but is usually able to carry on a fluid conversation. Has been on clozapine and lithium most recently as his medication.  Risk to Self: Is patient at risk for suicide?:  (Unable to assess at this time.) Risk to Others:   Prior Inpatient Therapy:   Prior Outpatient Therapy:    Past Medical History:  Past Medical History:  Diagnosis Date  . Acid reflux   . Anxiety   . Bipolar 1 disorder ([facility])   . Schizophrenia, acute ([facility])   . Seizures ([facility])     Past Surgical History:  Procedure Laterality Date  . ABDOMINAL SURGERY     Family History: No family history on file. Family Psychiatric  History: None is identified Social History:  History  Alcohol Use  . Yes     History  Drug Use No    Social History   Social History  .  Marital status: Single    Spouse name: N/A  . Number of children: N/A  . Years of education: N/A   Social History Main Topics  . Smoking status: Current Every Day Smoker    Packs/day: 0.50    Types: Cigarettes  . Smokeless tobacco: None  . Alcohol use Yes  . Drug use: No  . Sexual activity: No   Other Topics Concern  . None   Social History Narrative  . None   Additional  Social History:    Allergies:   Allergies  Allergen Reactions  . Peanut-Containing Drug Products   . Benadryl [Diphenhydramine Hcl] Other (See Comments)    Labs:  No results found for this or any previous visit (from the past 48 hour(s)).  Current Facility-Administered Medications  Medication Dose Route Frequency Provider Last Rate Last Dose  . cloZAPine (CLOZARIL) tablet 100 mg  100 mg Oral BID Audery AmelJohn T Monte Zinni, MD   Stopped at 04/25/16 2136  . lithium carbonate capsule 300 mg  300 mg Oral BID WC Audery AmelJohn T Terre Hanneman, MD   300 mg at 04/26/16 1715  . LORazepam (ATIVAN) injection 1 mg  1 mg Intramuscular Q6H Myrna Blazeravid Matthew Schaevitz, MD      . metoprolol tartrate (LOPRESSOR) tablet 25 mg  25 mg Oral BID Audery AmelJohn T Davidson Palmieri, MD   Stopped at 04/25/16 2137   Current Outpatient Prescriptions  Medication Sig Dispense Refill  . cloZAPine (CLOZARIL) 100 MG tablet Take 1 tablet (100 mg total) by mouth 2 (two) times daily. 60 tablet 0  . docusate sodium (COLACE) 100 MG capsule Take 2 capsules (200 mg total) by mouth 2 (two) times daily. 10 capsule 0  . fluticasone (FLONASE) 50 MCG/ACT nasal spray Place 2 sprays into both nostrils 2 (two) times daily as needed for allergies or rhinitis. 1 g 0  . lithium carbonate (LITHOBID) 300 MG CR tablet Take 2 tablets (600 mg total) by mouth every 12 (twelve) hours. 120 tablet 0  . metFORMIN (GLUCOPHAGE) 500 MG tablet Take 1 tablet (500 mg total) by mouth 2 (two) times daily with a meal. 60 tablet 0  . metoprolol tartrate (LOPRESSOR) 25 MG tablet Take 1 tablet (25 mg total) by mouth 2 (two) times daily. 30 tablet 0    Musculoskeletal: Strength & Muscle Tone: decreased Gait & Station: unable to stand Patient leans: N/A  Psychiatric Specialty Exam: Physical Exam  Nursing note and vitals reviewed. Constitutional: He appears well-developed and well-nourished.  HENT:  Head: Normocephalic and atraumatic.  Eyes: Conjunctivae are normal. Pupils are equal, round, and  reactive to light.  Neck: Normal range of motion.  Cardiovascular: Regular rhythm and normal heart sounds.   Respiratory: Effort normal. No respiratory distress.  GI: Soft.  Musculoskeletal: Normal range of motion.  Neurological: He is alert.  Skin: Skin is warm and dry.  Psychiatric: His affect is labile. His speech is tangential. His speech is not delayed. He is hyperactive. He is not slowed. Thought content is delusional. Cognition and memory are not impaired. He expresses impulsivity.    Review of Systems  Unable to perform ROS: Patient nonverbal    Blood pressure 140/83, pulse 99, temperature 98.1 F (36.7 C), temperature source Oral, resp. rate 16, height 6' (1.829 m), weight (!) 142.9 kg (315 lb), SpO2 96 %.Body mass index is 42.72 kg/m.  General Appearance: Disheveled  Eye Contact:  Minimal  Speech:  Negative  Volume:  Decreased  Mood:  Negative  Affect:  Negative  Thought Process:  NA  Orientation:  Negative  Thought Content:  Negative  Suicidal Thoughts:  No  Homicidal Thoughts:  No  Memory:  Negative  Judgement:  Negative  Insight:  Negative  Psychomotor Activity:  Decreased  Concentration:  Concentration: Poor  Recall:  Negative  Fund of Knowledge:  Negative  Language:  Negative  Akathisia:  Negative  Handed:  Right  AIMS (if indicated):     Assets:  Housing Physical Health Resilience  ADL's:  Impaired  Cognition:  Impaired,  Mild  Sleep:        Treatment Plan Summary: Daily contact with patient to assess and evaluate symptoms and progress in treatment, Medication management and Plan No longer catatonic. I will continue the standing dose of Ativan for now however. Continue him on his lithium and clozapine. Daily monitoring. He was still not lucid enough to be able to give me any history that would make sense of his recent events.  Disposition: Supportive therapy provided about ongoing stressors.  Mordecai Rasmussen, MD 04/26/2016 5:21 PM

## 2016-04-26 NOTE — ED Provider Notes (Signed)
-----------------------------------------   10:07 AM on 04/26/2016 -----------------------------------------   Blood pressure 140/83, pulse 99, temperature 98.1 F (36.7 C), temperature source Oral, resp. rate 16, height 6' (1.829 m), weight (!) 315 lb (142.9 kg), SpO2 96 %.  The patient had no acute events since last update.  Calm and cooperative at this time.  Disposition is pending Psychiatry/Behavioral Medicine team recommendations.     Myrna Blazeravid Matthew Kemaya Dorner, MD 04/26/16 1007

## 2016-04-26 NOTE — ED Notes (Signed)
Pt oob to br and in hall talking to staff, singing and laughing. A complete change from this am. Pt also ate breakfast from this am and lunch.

## 2016-04-26 NOTE — ED Notes (Signed)
Attempted to interact with pt. Pt kept head under blanket and did not respond.. I removed blanket and introduced self to pt. Pt just stared at me and did not communicate. Attempted several times to communicate without any results. Nothing indicates, pt, vs, that pt is any physical distress.

## 2016-04-26 NOTE — ED Notes (Signed)
Pt given meal tray and juice. 

## 2016-04-26 NOTE — ED Notes (Signed)
Pt given cup of water 

## 2016-04-27 DIAGNOSIS — F250 Schizoaffective disorder, bipolar type: Secondary | ICD-10-CM | POA: Diagnosis not present

## 2016-04-27 MED ORDER — CLOZAPINE 100 MG PO TABS
ORAL_TABLET | ORAL | Status: AC
  Administered 2016-04-27: 100 mg via ORAL
  Filled 2016-04-27: qty 1

## 2016-04-27 NOTE — ED Notes (Signed)

## 2016-04-27 NOTE — ED Notes (Signed)
BEHAVIORAL HEALTH ROUNDING Patient sleeping: Yes.   Patient alert and oriented: eyes closed  Appears to be asleep Behavior appropriate: Yes.  ; If no, describe:  Nutrition and fluids offered: Yes  Toileting and hygiene offered: sleeping Sitter present: q 15 minute observations and security monitoring Law enforcement present: yes  ODS 

## 2016-04-27 NOTE — ED Notes (Signed)
BEHAVIORAL HEALTH ROUNDING Patient sleeping: No. Patient alert and oriented: yes Behavior appropriate: Yes.  ; If no, describe:  Nutrition and fluids offered: yes Toileting and hygiene offered: Yes  Sitter present: q15 minute observations and security  monitoring Law enforcement present: Yes  ODS  

## 2016-04-27 NOTE — ED Notes (Signed)
PATIENT ASKED FOR SHOWER ,BUT PATIENT STANDING IN BATHROOM REFUSING TO TAKE SHOWER.

## 2016-04-27 NOTE — ED Provider Notes (Signed)
-----------------------------------------   7:21 AM on 04/27/2016 -----------------------------------------   Blood pressure 118/70, pulse 73, temperature 97.6 F (36.4 C), temperature source Oral, resp. rate 18, height 6' (1.829 m), weight (!) 315 lb (142.9 kg), SpO2 97 %.  The patient had no acute events since last update.  Patient has been evaluated by psychiatry. Patient no longer thought to be in a catatonic state. Currently awaiting psychiatric disposition.   Minna AntisKevin Anthem Frazer, MD 04/27/16 [phone]

## 2016-04-27 NOTE — ED Notes (Signed)
Patient observed lying in bed with eyes closed  Even, unlabored respirations observed   NAD pt appears to be sleeping  I will continue to monitor along with every 15 minute visual observations and ongoing security monitoring    

## 2016-04-27 NOTE — ED Notes (Signed)
ED Is the patient under IVC or is there intent for IVC: Yes.   Is the patient medically cleared: Yes.   Is there vacancy in the ED BHU: Yes.   Is the population mix appropriate for patient: Yes.   Is the patient awaiting placement in inpatient or outpatient setting: Yes.   Has the patient had a psychiatric consult: Yes.   Survey of unit performed for contraband, proper placement and condition of furniture, tampering with fixtures in bathroom, shower, and each patient room: Yes.  ; Findings:  APPEARANCE/BEHAVIOR Calm and cooperative NEURO ASSESSMENT Orientation: oriented x4 Denies pain Hallucinations: No.None noted (Hallucinations) Speech: Normal Gait: normal RESPIRATORY ASSESSMENT Even  Unlabored respirations  CARDIOVASCULAR ASSESSMENT Pulses equal   regular rate  Skin warm and dry   GASTROINTESTINAL ASSESSMENT no GI complaint EXTREMITIES Full ROM  PLAN OF CARE Provide calm/safe environment. Vital signs assessed twice daily. ED BHU Assessment once each 12-hour shift. Collaborate with TTS daily or as condition indicates. Assure the ED provider has rounded once each shift. Provide and encourage hygiene. Provide redirection as needed. Assess for escalating behavior; address immediately and inform ED provider.  Assess family dynamic and appropriateness for visitation as needed: Yes.  ; If necessary, describe findings:  Educate the patient/family about BHU procedures/visitation: Yes.  ; If necessary, describe findings:   

## 2016-04-27 NOTE — ED Notes (Signed)
IVC/  PENDING  PLACEMENT 

## 2016-04-27 NOTE — Consult Note (Signed)
[facility] Face-to-Face Psychiatry Consult   Reason for Consult:  Consult for 31 year old man brought from jail with unresponsiveness Referring Physician:  Derrill Kay Patient Identification: [name] MRN:  [id number] Principal Diagnosis: Schizoaffective disorder, bipolar type [facility]) Diagnosis:   Patient Active Problem List   Diagnosis Date Noted  . 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 Tachycardia [R00.0] 08/13/2015  . Tobacco use disorder [F17.200] 08/13/2015  . Schizoaffective disorder, bipolar type (HCC) [F25.0] 11/20/2012    Total Time spent with patient: 20 minutes  Subjective:   [name] is a 31 y.o. male patient admitted with patient didn't make any statement to me.  Follow-up for Friday the fifth. Patient appears to be improving daily. This afternoon he was up out of bed and ambulating on his own. He was able to engage in back and forth conversation for several minutes. Some of it was lucid. Other parts of it seems to still be delusional. He has not been violent and he has been cooperative with his medication. He still has episodes of looking like he is lapsing into near catatonia but I think in general he is getting better. Appears to be tolerating medicine  Follow-up for Thursday the fourth. Patient seen chart reviewed. Patient is significantly different today. He was awake and alert. He was able to interact with me. Able to answer some simple questions rationally although his thoughts were still very easily distracted and confused. He was mostly focused on dancing in his room. Made a few jokes some of them that made sense. Giggly. Not aggressive. Taking his medicine.  HPI:  Patient interviewed and observe. Chart reviewed. Case discussed with emergency room physician TTS and nursing. 31 year old man with a history of schizoaffective disorder was brought from jail. The officers from jail reported that the patient has not been eating or  taking his medicine for several days. Patient was covered with urine and feces when he came into the hospital. He was fairly unresponsive. Apparently he did speak a little bit to the emergency room doctor but did not speak at all when I came in to see him. He stared at me and shook his head slightly but not in a way that allowed for any kind of real communication. Labs reviewed. His bilirubin is up a little bit nothing else really dramatic. I don't see a urine back yet. Don't have any more information about what might of recently gone on with him.  Social history: This is a patient with chronic mental illness who has had hospitalizations several times in the past and has been here for extended periods area he has a guardian and I believe and has had diagnoses of schizoaffective disorder. Has been aggressive in the past and very disorganized in his thinking at times.  Medical history: Overweight and has some glucose intolerance. Some high blood pressure and tachycardia probably related to his medicine.  Substance abuse history: Not a major primary problem. Has occasionally gotten some substance abuse when not supervised.  Past Psychiatric History: Patient is well known to [place] and usually presents as hyperactive and manic-like. Seems to have some degree of developmental disability but is usually able to carry on a fluid conversation. Has been on clozapine and lithium most recently as his medication.  Risk to Self: Is patient at risk for suicide?:  (Unable to assess at this time.) Risk to Others:   Prior Inpatient Therapy:   Prior Outpatient Therapy:    Past Medical History:  Past  Medical History:  Diagnosis Date  . Acid reflux   . Anxiety   . Bipolar 1 disorder (HCC)   . Schizophrenia, acute (HCC)   . Seizures (HCC)     Past Surgical History:  Procedure Laterality Date  . ABDOMINAL SURGERY     Family History: No family history on file. Family Psychiatric  History: None is identified Social  History:  History  Alcohol Use  . Yes     History  Drug Use No    Social History   Social History  . Marital status: Single    Spouse name: N/A  . Number of children: N/A  . Years of education: N/A   Social History Main Topics  . Smoking status: Current Every Day Smoker    Packs/day: 0.50    Types: Cigarettes  . Smokeless tobacco: None  . Alcohol use Yes  . Drug use: No  . Sexual activity: No   Other Topics Concern  . None   Social History Narrative  . None   Additional Social History:    Allergies:   Allergies  Allergen Reactions  . Peanut-Containing Drug Products   . Benadryl [Diphenhydramine Hcl] Other (See Comments)    Labs:  No results found for this or any previous visit (from the past 48 hour(s)).  Current Facility-Administered Medications  Medication Dose Route Frequency Provider Last Rate Last Dose  . cloZAPine (CLOZARIL) tablet 100 mg  100 mg Oral BID Audery AmelJohn T Clapacs, MD   100 mg at 04/27/16 1021  . lithium carbonate capsule 300 mg  300 mg Oral BID WC Audery AmelJohn T Clapacs, MD   300 mg at 04/27/16 1022  . LORazepam (ATIVAN) injection 1 mg  1 mg Intramuscular Q6H Myrna Blazeravid Matthew Schaevitz, MD   1 mg at 04/27/16 1023  . metoprolol tartrate (LOPRESSOR) tablet 25 mg  25 mg Oral BID Audery AmelJohn T Clapacs, MD   25 mg at 04/27/16 1022   Current Outpatient Prescriptions  Medication Sig Dispense Refill  . cloZAPine (CLOZARIL) 100 MG tablet Take 1 tablet (100 mg total) by mouth 2 (two) times daily. 60 tablet 0  . docusate sodium (COLACE) 100 MG capsule Take 2 capsules (200 mg total) by mouth 2 (two) times daily. 10 capsule 0  . fluticasone (FLONASE) 50 MCG/ACT nasal spray Place 2 sprays into both nostrils 2 (two) times daily as needed for allergies or rhinitis. 1 g 0  . lithium carbonate (LITHOBID) 300 MG CR tablet Take 2 tablets (600 mg total) by mouth every 12 (twelve) hours. 120 tablet 0  . metFORMIN (GLUCOPHAGE) 500 MG tablet Take 1 tablet (500 mg total) by mouth 2 (two)  times daily with a meal. 60 tablet 0  . metoprolol tartrate (LOPRESSOR) 25 MG tablet Take 1 tablet (25 mg total) by mouth 2 (two) times daily. 30 tablet 0    Musculoskeletal: Strength & Muscle Tone: decreased Gait & Station: unable to stand Patient leans: N/A  Psychiatric Specialty Exam: Physical Exam  Nursing note and vitals reviewed. Constitutional: He appears well-developed and well-nourished.  HENT:  Head: Normocephalic and atraumatic.  Eyes: Conjunctivae are normal. Pupils are equal, round, and reactive to light.  Neck: Normal range of motion.  Cardiovascular: Regular rhythm and normal heart sounds.   Respiratory: Effort normal. No respiratory distress.  GI: Soft.  Musculoskeletal: Normal range of motion.  Neurological: He is alert.  Skin: Skin is warm and dry.  Psychiatric: His affect is labile. His speech is tangential. His speech is not  delayed. He is hyperactive. He is not slowed. Thought content is delusional. Cognition and memory are not impaired. He expresses impulsivity.    Review of Systems  Unable to perform ROS: Patient nonverbal    Blood pressure 118/70, pulse 73, temperature 97.6 F (36.4 C), temperature source Oral, resp. rate 18, height 6' (1.829 m), weight (!) 142.9 kg (315 lb), SpO2 97 %.Body mass index is 42.72 kg/m.  General Appearance: Disheveled  Eye Contact:  Minimal  Speech:  Negative  Volume:  Decreased  Mood:  Negative  Affect:  Negative  Thought Process:  NA  Orientation:  Negative  Thought Content:  Negative  Suicidal Thoughts:  No  Homicidal Thoughts:  No  Memory:  Negative  Judgement:  Negative  Insight:  Negative  Psychomotor Activity:  Decreased  Concentration:  Concentration: Poor  Recall:  Negative  Fund of Knowledge:  Negative  Language:  Negative  Akathisia:  Negative  Handed:  Right  AIMS (if indicated):     Assets:  Housing Physical Health Resilience  ADL's:  Impaired  Cognition:  Impaired,  Mild  Sleep:         Treatment Plan Summary: Daily contact with patient to assess and evaluate symptoms and progress in treatment, Medication management and Plan Case reviewed with TTS with nursing and emergency room physician. Gradually getting better. No change to medicine today neither the regular antipsychotics and mood stabilizers or the Ativan. If he continues to show improvement and is able to ambulate safely can be considered whether he could be moved back here to the [facility]. We will follow-up after the weekend when hopefully we can see more clearly whether he is going to be ready for placement or need hospitalization. All of this was explained to the patient.  Disposition: Supportive therapy provided about ongoing stressors.  Mordecai Rasmussen, MD 04/27/2016 5:58 PM

## 2016-04-28 DIAGNOSIS — F250 Schizoaffective disorder, bipolar type: Secondary | ICD-10-CM | POA: Diagnosis not present

## 2016-04-28 MED ORDER — LORAZEPAM 1 MG PO TABS
1.0000 mg | ORAL_TABLET | Freq: Four times a day (QID) | ORAL | Status: DC | PRN
  Administered 2016-04-29 – 2016-05-02 (×7): 1 mg via ORAL
  Filled 2016-04-28 (×7): qty 1

## 2016-04-28 NOTE — ED Provider Notes (Signed)
-----------------------------------------   7:34 AM on 04/28/2016 -----------------------------------------   Blood pressure 123/73, pulse 83, temperature 98.2 F (36.8 C), temperature source Oral, resp. rate 18, height 6' (1.829 m), weight (!) 315 lb (142.9 kg), SpO2 97 %.  The patient had no acute events since last update.  Calm and cooperative at this time.  Disposition is pending Psychiatry/Behavioral Medicine team recommendations.     Willy EddyPatrick Macarius Ruark, MD 04/28/16 [phone]

## 2016-04-28 NOTE — ED Notes (Signed)
Patient alert and oriented. He was cooperative with nursing assessment.  Patient requested and took a shower. Maintained on 15 minute checks and observation by security camera for safety.

## 2016-04-28 NOTE — ED Notes (Signed)
Report was received from Amy H., RN; Pt. Verbalizes no complaints or distress; denies S.I./Hi. Continue to monitor with 15 min. Monitoring. 

## 2016-04-28 NOTE — ED Notes (Signed)
Pt recalls this RN, pt asking about recent history, talking about my watch Positive interaction, good eye contact, engaging and hyper-excited speech per usual

## 2016-04-28 NOTE — ED Notes (Signed)
Patient given snack tray and beverage. Appetite very good.

## 2016-04-28 NOTE — ED Notes (Signed)

## 2016-04-28 NOTE — ED Notes (Signed)
Patient resting quietly in room. No noted distress or abnormal behaviors noted. Will continue 15 minute checks and observation by security camera for safety. 

## 2016-04-28 NOTE — ED Notes (Signed)
IVC/  PENDING  PLACEMENT 

## 2016-04-28 NOTE — ED Notes (Signed)
Lunch tray given to patient with juice.  

## 2016-04-28 NOTE — ED Notes (Signed)
ED BHU PLACEMENT JUSTIFICATION Is the patient under IVC or is there intent for IVC: Yes.   Is the patient medically cleared: Yes.   Is there vacancy in the ED BHU: Yes.   Is the population mix appropriate for patient: Yes.   Is the patient awaiting placement in inpatient or outpatient setting: Yes.   Has the patient had a psychiatric consult: Yes.   Survey of unit performed for contraband, proper placement and condition of furniture, tampering with fixtures in bathroom, shower, and each patient room: Yes.  ; Findings:APPEARANCE/BEHAVIOR calm, cooperative and adequate rapport can be established NEURO ASSESSMENT Orientation: time, place and person Hallucinations: Yes.  None noted (Hallucinations) Speech: Normal Gait: normal RESPIRATORY ASSESSMENT Normal expansion.  Clear to auscultation.  No rales, rhonchi, or wheezing. CARDIOVASCULAR ASSESSMENT regular rate and rhythm, S1, S2 normal, no murmur, click, rub or gallop GASTROINTESTINAL ASSESSMENT soft, nontender, BS WNL, no r/g EXTREMITIES normal strength, tone, and muscle mass PLAN OF CARE Provide calm/safe environment. Vital signs assessed twice daily. ED BHU Assessment once each 12-hour shift. Collaborate with intake RN daily or as condition indicates. Assure the ED provider has rounded once each shift. Provide and encourage hygiene. Provide redirection as needed. Assess for escalating behavior; address immediately and inform ED provider.  Assess family dynamic and appropriateness for visitation as needed: Yes.     Educate the patient/family about BHU procedures/visitation: Yes.

## 2016-04-29 DIAGNOSIS — F250 Schizoaffective disorder, bipolar type: Secondary | ICD-10-CM | POA: Diagnosis not present

## 2016-04-29 NOTE — ED Notes (Signed)
Patient received meal tray and beverage. He appears more spontaneous and is able to engage in conversation easily. Maintained on 15 minute checks and observation by security camera for safety.

## 2016-04-29 NOTE — ED Notes (Signed)
Patient resting quietly in room. No noted distress or abnormal behaviors noted. Will continue 15 minute checks and observation by security camera for safety. 

## 2016-04-29 NOTE — ED Notes (Signed)
Report received from Amy RN  

## 2016-04-29 NOTE — ED Notes (Signed)
Patient with increasingly labile mood and some s/s catonia...holding arm rigidly and having blank affect. Given Ativan per MD order to assist with symptom control. Maintained on 15 minute checks and observation by security camera for safety.

## 2016-04-29 NOTE — ED Notes (Signed)
Patient received meal tray and beverage. 

## 2016-04-29 NOTE — ED Notes (Signed)
ED BHU PLACEMENT JUSTIFICATION Is the patient under IVC or is there intent for IVC: Yes.   Is the patient medically cleared: Yes.   Is there vacancy in the ED BHU: Yes.   Is the population mix appropriate for patient: Yes.   Is the patient awaiting placement in inpatient or outpatient setting: No. Has the patient had a psychiatric consult: Yes.   Survey of unit performed for contraband, proper placement and condition of furniture, tampering with fixtures in bathroom, shower, and each patient room: Yes.  ; Findings: NA APPEARANCE/BEHAVIOR calm, cooperative and adequate rapport can be established NEURO ASSESSMENT Orientation: time, place and person Hallucinations: No.None noted (Hallucinations) Speech: Normal Gait: normal RESPIRATORY ASSESSMENT Normal expansion.  Clear to auscultation.  No rales, rhonchi, or wheezing. CARDIOVASCULAR ASSESSMENT regular rate and rhythm, S1, S2 normal, no murmur, click, rub or gallop GASTROINTESTINAL ASSESSMENT soft, nontender, BS WNL, no r/g EXTREMITIES normal strength, tone, and muscle mass, no deformities PLAN OF CARE Provide calm/safe environment. Vital signs assessed twice daily. ED BHU Assessment once each 12-hour shift. Collaborate with intake RN daily or as condition indicates. Assure the ED provider has rounded once each shift. Provide and encourage hygiene. Provide redirection as needed. Assess for escalating behavior; address immediately and inform ED provider.  Assess family dynamic and appropriateness for visitation as needed: Yes.  ; If necessary, describe findings: NA Educate the patient/family about BHU procedures/visitation: Yes.  ; If necessary, describe findings: NA

## 2016-04-29 NOTE — ED Notes (Signed)
Sandwich and soft drink given.  

## 2016-04-29 NOTE — ED Notes (Signed)
Patient asleep in room. No noted distress or abnormal behavior. Will continue 15 minute checks and observation by security cameras for safety. 

## 2016-04-29 NOTE — ED Notes (Signed)
Patient received breakfast tray and beverage. Maintained on 15 minute checks and observation by security camera for safety. 

## 2016-04-29 NOTE — ED Notes (Signed)

## 2016-04-29 NOTE — ED Notes (Signed)
Patient having difficulty responding to staff redirection. Patient was asked to pick up his trash and linen from the floor so environmental services could clean floor. He became very loud and belligerent and needed very firm limits set before he would comply.

## 2016-04-29 NOTE — ED Provider Notes (Signed)
-----------------------------------------   7:46 AM on 04/29/2016 -----------------------------------------   Blood pressure 137/77, pulse 91, temperature 98.6 F (37 C), temperature source Oral, resp. rate 18, height 6' (1.829 m), weight (!) 315 lb (142.9 kg), SpO2 100 %.  The patient had no acute events since last update.  Calm and cooperative at this time.  Disposition is pending per Psychiatry/Behavioral Medicine team recommendations.     Nita Sicklearolina Cejay Cambre, MD 04/29/16 [phone]

## 2016-04-29 NOTE — ED Notes (Signed)
Patient in common area watching television. Maintained on 15 minute checks and observation by security camera for safety. 

## 2016-04-29 NOTE — ED Notes (Signed)

## 2016-04-29 NOTE — ED Notes (Signed)
Patient is starting. Patient is slow to respond to writers request or direction. Will continue to monitor.
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
## 2016-04-29 NOTE — ED Notes (Signed)
Pt. Alert and oriented, warm and dry, in no distress. Pt. Denies SI, HI, and AVH. Pt. Encouraged to let nursing staff know of any concerns or needs. 

## 2016-04-30 DIAGNOSIS — F250 Schizoaffective disorder, bipolar type: Secondary | ICD-10-CM | POA: Diagnosis not present

## 2016-04-30 MED ORDER — HYDROXYZINE HCL 25 MG PO TABS
50.0000 mg | ORAL_TABLET | Freq: Three times a day (TID) | ORAL | Status: DC | PRN
  Administered 2016-04-30 – 2016-05-17 (×21): 50 mg via ORAL
  Filled 2016-04-30 (×21): qty 2

## 2016-04-30 NOTE — Consult Note (Signed)
[facility] Face-to-Face Psychiatry Consult   Reason for Consult:  Consult for 31 year old man brought from jail with unresponsiveness Referring Physician:  Derrill Kay Patient Identification: [name] MRN:  [id number] Principal Diagnosis: Schizoaffective disorder, bipolar type [facility]) Diagnosis:   Patient Active Problem List   Diagnosis Date Noted  . 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 Tachycardia [R00.0] 08/13/2015  . Tobacco use disorder [F17.200] 08/13/2015  . Schizoaffective disorder, bipolar type (HCC) [F25.0] 11/20/2012    Total Time spent with patient: 20 minutes  Subjective:   [name] is a 31 y.o. male patient admitted with patient didn't make any statement to me.  Follow-up for Friday the fifth. Patient appears to be improving daily. This afternoon he was up out of bed and ambulating on his own. He was able to engage in back and forth conversation for several minutes. Some of it was lucid. Other parts of it seems to still be delusional. He has not been violent and he has been cooperative with his medication. He still has episodes of looking like he is lapsing into near catatonia but I think in general he is getting better. Appears to be tolerating medicine  Follow-up for Thursday the fourth. Patient seen chart reviewed. Patient is significantly different today. He was awake and alert. He was able to interact with me. Able to answer some simple questions rationally although his thoughts were still very easily distracted and confused. He was mostly focused on dancing in his room. Made a few jokes some of them that made sense. Giggly. Not aggressive. Taking his medicine.  HPI:  Patient interviewed and observe. Chart reviewed. Case discussed with emergency room physician TTS and nursing. 31 year old man with a history of schizoaffective disorder was brought from jail. The officers from jail reported that the patient has not been eating or  taking his medicine for several days. Patient was covered with urine and feces when he came into the hospital. He was fairly unresponsive. Apparently he did speak a little bit to the emergency room doctor but did not speak at all when I came in to see him. He stared at me and shook his head slightly but not in a way that allowed for any kind of real communication. Labs reviewed. His bilirubin is up a little bit nothing else really dramatic. I don't see a urine back yet. Don't have any more information about what might of recently gone on with him.  Social history: This is a patient with chronic mental illness who has had hospitalizations several times in the past and has been here for extended periods area he has a guardian and I believe and has had diagnoses of schizoaffective disorder. Has been aggressive in the past and very disorganized in his thinking at times.  Medical history: Overweight and has some glucose intolerance. Some high blood pressure and tachycardia probably related to his medicine.  Substance abuse history: Not a major primary problem. Has occasionally gotten some substance abuse when not supervised.  Past Psychiatric History: Patient is well known to [place] and usually presents as hyperactive and manic-like. Seems to have some degree of developmental disability but is usually able to carry on a fluid conversation. Has been on clozapine and lithium most recently as his medication.  Risk to Self: Is patient at risk for suicide?:  (Unable to assess at this time.) Risk to Others:   Prior Inpatient Therapy:   Prior Outpatient Therapy:    Past Medical History:  Past  Medical History:  Diagnosis Date  . Acid reflux   . Anxiety   . Bipolar 1 disorder (HCC)   . Schizophrenia, acute (HCC)   . Seizures (HCC)     Past Surgical History:  Procedure Laterality Date  . ABDOMINAL SURGERY     Family History: No family history on file. Family Psychiatric  History: None is identified Social  History:  History  Alcohol Use  . Yes     History  Drug Use No    Social History   Social History  . Marital status: Single    Spouse name: N/A  . Number of children: N/A  . Years of education: N/A   Social History Main Topics  . Smoking status: Current Every Day Smoker    Packs/day: 0.50    Types: Cigarettes  . Smokeless tobacco: None  . Alcohol use Yes  . Drug use: No  . Sexual activity: No   Other Topics Concern  . None   Social History Narrative  . None   Additional Social History:    Allergies:   Allergies  Allergen Reactions  . Peanut-Containing Drug Products   . Benadryl [Diphenhydramine Hcl] Other (See Comments)    Labs:  No results found for this or any previous visit (from the past 48 hour(s)).  Current Facility-Administered Medications  Medication Dose Route Frequency Provider Last Rate Last Dose  . cloZAPine (CLOZARIL) tablet 100 mg  100 mg Oral BID Audery Amel, MD   100 mg at 04/30/16 1141  . hydrOXYzine (ATARAX/VISTARIL) tablet 50 mg  50 mg Oral TID PRN Audery Amel, MD   50 mg at 04/30/16 1408  . lithium carbonate capsule 300 mg  300 mg Oral BID WC Audery Amel, MD   300 mg at 04/30/16 1747  . LORazepam (ATIVAN) tablet 1 mg  1 mg Oral Q6H PRN Sharman Cheek, MD   1 mg at 04/30/16 1747  . metoprolol tartrate (LOPRESSOR) tablet 25 mg  25 mg Oral BID Audery Amel, MD   25 mg at 04/30/16 1141   Current Outpatient Prescriptions  Medication Sig Dispense Refill  . cloZAPine (CLOZARIL) 100 MG tablet Take 1 tablet (100 mg total) by mouth 2 (two) times daily. 60 tablet 0  . docusate sodium (COLACE) 100 MG capsule Take 2 capsules (200 mg total) by mouth 2 (two) times daily. 10 capsule 0  . fluticasone (FLONASE) 50 MCG/ACT nasal spray Place 2 sprays into both nostrils 2 (two) times daily as needed for allergies or rhinitis. 1 g 0  . lithium carbonate (LITHOBID) 300 MG CR tablet Take 2 tablets (600 mg total) by mouth every 12 (twelve) hours. 120  tablet 0  . metFORMIN (GLUCOPHAGE) 500 MG tablet Take 1 tablet (500 mg total) by mouth 2 (two) times daily with a meal. 60 tablet 0  . metoprolol tartrate (LOPRESSOR) 25 MG tablet Take 1 tablet (25 mg total) by mouth 2 (two) times daily. 30 tablet 0    Musculoskeletal: Strength & Muscle Tone: decreased Gait & Station: unable to stand Patient leans: N/A  Psychiatric Specialty Exam: Physical Exam  Nursing note and vitals reviewed. Constitutional: He appears well-developed and well-nourished.  HENT:  Head: Normocephalic and atraumatic.  Eyes: Conjunctivae are normal. Pupils are equal, round, and reactive to light.  Neck: Normal range of motion.  Cardiovascular: Regular rhythm and normal heart sounds.   Respiratory: Effort normal. No respiratory distress.  GI: Soft.  Musculoskeletal: Normal range of motion.  Neurological:  He is alert.  Skin: Skin is warm and dry.  Psychiatric: His affect is labile. His speech is tangential. His speech is not delayed. He is hyperactive. He is not slowed. Thought content is delusional. Cognition and memory are not impaired. He expresses impulsivity.    Review of Systems  Unable to perform ROS: Patient nonverbal    Blood pressure 119/75, pulse 79, temperature 98.5 F (36.9 C), temperature source Oral, resp. rate 17, height 6' (1.829 m), weight (!) 142.9 kg (315 lb), SpO2 99 %.Body mass index is 42.72 kg/m.  General Appearance: Disheveled  Eye Contact:  Minimal  Speech:  Negative  Volume:  Decreased  Mood:  Negative  Affect:  Negative  Thought Process:  NA  Orientation:  Negative  Thought Content:  Negative  Suicidal Thoughts:  No  Homicidal Thoughts:  No  Memory:  Negative  Judgement:  Negative  Insight:  Negative  Psychomotor Activity:  Decreased  Concentration:  Concentration: Poor  Recall:  Negative  Fund of Knowledge:  Negative  Language:  Negative  Akathisia:  Negative  Handed:  Right  AIMS (if indicated):     Assets:   Housing Physical Health Resilience  ADL's:  Impaired  Cognition:  Impaired,  Mild  Sleep:        Treatment Plan Summary: Daily contact with patient to assess and evaluate symptoms and progress in treatment, Medication management and Plan Patient has continued to look improved and appears to be getting back to his baseline. He can still be a little bit loud intermittently but he is generally cooperative and has not been violent or threatening. Case reviewed with TTS. He may be getting close to his baseline and we might be able to focus on discharge planning.  Disposition: Supportive therapy provided about ongoing stressors.  Mordecai RasmussenJohn Enriqueta Augusta, MD 04/30/2016 7:21 PM

## 2016-04-30 NOTE — ED Notes (Signed)
Sandwich and soft drink given.  

## 2016-04-30 NOTE — ED Provider Notes (Signed)
-----------------------------------------   7:17 AM on 04/30/2016 -----------------------------------------   Blood pressure 131/73, pulse 88, temperature 98.3 F (36.8 C), temperature source Oral, resp. rate 18, height 6' (1.829 m), weight (!) 315 lb (142.9 kg), SpO2 100 %.  The patient had no acute events since last update.  Calm and cooperative at this time.  Disposition is pending Psychiatry/Behavioral Medicine team recommendations.     Irean HongJade J Danitza Schoenfeldt, MD 04/30/16 [phone]

## 2016-04-30 NOTE — ED Notes (Signed)
ED BHU PLACEMENT JUSTIFICATION Is the patient under IVC or is there intent for IVC: Yes.   Is the patient medically cleared: Yes.   Is there vacancy in the ED BHU: Yes.   Is the population mix appropriate for patient: Yes.   Is the patient awaiting placement in inpatient or outpatient setting: Yes.   Has the patient had a psychiatric consult: Yes.   Survey of unit performed for contraband, proper placement and condition of furniture, tampering with fixtures in bathroom, shower, and each patient room: Yes.  ; Findings: NA APPEARANCE/BEHAVIOR calm and cooperative NEURO ASSESSMENT Orientation: time, place and person Hallucinations: No.None noted (Hallucinations) Speech: Normal Gait: normal RESPIRATORY ASSESSMENT Normal expansion.  Clear to auscultation.  No rales, rhonchi, or wheezing. CARDIOVASCULAR ASSESSMENT regular rate and rhythm, S1, S2 normal, no murmur, click, rub or gallop GASTROINTESTINAL ASSESSMENT soft, nontender, BS WNL, no r/g EXTREMITIES normal strength, tone, and muscle mass PLAN OF CARE Provide calm/safe environment. Vital signs assessed twice daily. ED BHU Assessment once each 12-hour shift. Collaborate with intake RN daily or as condition indicates. Assure the ED provider has rounded once each shift. Provide and encourage hygiene. Provide redirection as needed. Assess for escalating behavior; address immediately and inform ED provider.  Assess family dynamic and appropriateness for visitation as needed: Yes.  ; If necessary, describe findings: NA Educate the patient/family about BHU procedures/visitation: Yes.  ; If necessary, describe findings: NA 

## 2016-04-30 NOTE — ED Notes (Signed)
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
## 2016-04-30 NOTE — ED Notes (Signed)
PT IVC PENDING PLACEMENT. PAPERS NEED RENEWAL ON 05/01/16

## 2016-04-30 NOTE — ED Notes (Signed)
Pt. Alert and oriented, warm and dry, in no distress. Pt. Denies SI, HI, and AVH. Pt. Encouraged to let nursing staff know of any concerns or needs. 

## 2016-05-01 DIAGNOSIS — F250 Schizoaffective disorder, bipolar type: Secondary | ICD-10-CM | POA: Diagnosis not present

## 2016-05-01 MED ORDER — IBUPROFEN 800 MG PO TABS
ORAL_TABLET | ORAL | Status: AC
  Filled 2016-05-01: qty 1

## 2016-05-01 MED ORDER — IBUPROFEN 800 MG PO TABS
400.0000 mg | ORAL_TABLET | Freq: Four times a day (QID) | ORAL | Status: DC | PRN
  Administered 2016-05-01 – 2016-05-15 (×8): 400 mg via ORAL
  Filled 2016-05-01 (×7): qty 1

## 2016-05-01 NOTE — ED Notes (Signed)
Pt. Alert and oriented, warm and dry, in no distress. Pt. Denies SI, HI, and AVH. Pt during assessment would go blank and start to stare. This Clinical research associatewriter would reoriented patient. After assessment patient came to this writer and complained of having anxiety. This Clinical research associatewriter redirected patient to have a sit and do some deep breathing and calm self. Pt. Encouraged to let nursing staff know of any concerns or needs.

## 2016-05-01 NOTE — ED Notes (Signed)
Pt during the night pt was incontinent of urine

## 2016-05-01 NOTE — ED Notes (Signed)

## 2016-05-01 NOTE — Consult Note (Signed)
[facility] Face-to-Face Psychiatry Consult   Reason for Consult:  Consult for 31 year old man brought from jail with unresponsiveness Referring Physician:  Derrill KayGoodman Patient Identification: Riley LamyJoseph Torres MRN:  [id number] Principal Diagnosis: Schizoaffective disorder, bipolar type [facility](HCC) Diagnosis:   Patient Active Problem List   Diagnosis Date Noted  . 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 Tachycardia [R00.0] 08/13/2015  . Tobacco use disorder [F17.200] 08/13/2015  . Schizoaffective disorder, bipolar type (HCC) [F25.0] 11/20/2012    Total Time spent with patient: 20 minutes  Subjective:   [name] is a 31 y.o. male patient admitted with patient didn't make any statement to me.  Follow-up for Friday the fifth. Patient appears to be improving daily. This afternoon he was up out of bed and ambulating on his own. He was able to engage in back and forth conversation for several minutes. Some of it was lucid. Other parts of it seems to still be delusional. He has not been violent and he has been cooperative with his medication. He still has episodes of looking like he is lapsing into near catatonia but I think in general he is getting better. Appears to be tolerating medicine  Follow-up for Thursday the fourth. Patient seen chart reviewed. Patient is significantly different today. He was awake and alert. He was able to interact with me. Able to answer some simple questions rationally although his thoughts were still very easily distracted and confused. He was mostly focused on dancing in his room. Made a few jokes some of them that made sense. Giggly. Not aggressive. Taking his medicine.  Follow-up for Tuesday the ninth. Patient seen. Chart reviewed. Patient's behavior is back to pretty much his normal baseline. He is playful and intermittently agitated but not threatening. Taking his medicine.  HPI:  Patient interviewed and observe. Chart reviewed. Case  discussed with emergency room physician TTS and nursing. 31 year old man with a history of schizoaffective disorder was brought from jail. The officers from jail reported that the patient has not been eating or taking his medicine for several days. Patient was covered with urine and feces when he came into the hospital. He was fairly unresponsive. Apparently he did speak a little bit to the emergency room doctor but did not speak at all when I came in to see him. He stared at me and shook his head slightly but not in a way that allowed for any kind of real communication. Labs reviewed. His bilirubin is up a little bit nothing else really dramatic. I don't see a urine back yet. Don't have any more information about what might of recently gone on with him.  Social history: This is a patient with chronic mental illness who has had hospitalizations several times in the past and has been here for extended periods area he has a guardian and I believe and has had diagnoses of schizoaffective disorder. Has been aggressive in the past and very disorganized in his thinking at times.  Medical history: Overweight and has some glucose intolerance. Some high blood pressure and tachycardia probably related to his medicine.  Substance abuse history: Not a major primary problem. Has occasionally gotten some substance abuse when not supervised.  Past Psychiatric History: Patient is well known to us and usually presents as hyperactive and manic-like. Seems to have some degree of developmental disability but is usually able to carry on a fluid conversation. Has been on clozapine and lithium most recently as his medication.  Risk to Self: Is patient  at risk for suicide?:  (Unable to assess at this time.) Risk to Others:   Prior Inpatient Therapy:   Prior Outpatient Therapy:    Past Medical History:  Past Medical History:  Diagnosis Date  . Acid reflux   . Anxiety   . Bipolar 1 disorder (HCC)   . Schizophrenia, acute  (HCC)   . Seizures (HCC)     Past Surgical History:  Procedure Laterality Date  . ABDOMINAL SURGERY     Family History: No family history on file. Family Psychiatric  History: None is identified Social History:  History  Alcohol Use  . Yes     History  Drug Use No    Social History   Social History  . Marital status: Single    Spouse name: N/A  . Number of children: N/A  . Years of education: N/A   Social History Main Topics  . Smoking status: Current Every Day Smoker    Packs/day: 0.50    Types: Cigarettes  . Smokeless tobacco: None  . Alcohol use Yes  . Drug use: No  . Sexual activity: No   Other Topics Concern  . None   Social History Narrative  . None   Additional Social History:    Allergies:   Allergies  Allergen Reactions  . Peanut-Containing Drug Products   . Benadryl [Diphenhydramine Hcl] Other (See Comments)    Labs:  No results found for this or any previous visit (from the past 48 hour(s)).  Current Facility-Administered Medications  Medication Dose Route Frequency Provider Last Rate Last Dose  . cloZAPine (CLOZARIL) tablet 100 mg  100 mg Oral BID Audery Amel, MD   100 mg at 05/01/16 1039  . hydrOXYzine (ATARAX/VISTARIL) tablet 50 mg  50 mg Oral TID PRN Audery Amel, MD   50 mg at 05/01/16 1602  . ibuprofen (ADVIL,MOTRIN) 800 MG tablet           . ibuprofen (ADVIL,MOTRIN) tablet 400 mg  400 mg Oral Q6H PRN Audery Amel, MD   400 mg at 05/01/16 1618  . lithium carbonate capsule 300 mg  300 mg Oral BID WC Audery Amel, MD   300 mg at 05/01/16 1603  . LORazepam (ATIVAN) tablet 1 mg  1 mg Oral Q6H PRN Sharman Cheek, MD   1 mg at 04/30/16 1747  . metoprolol tartrate (LOPRESSOR) tablet 25 mg  25 mg Oral BID Audery Amel, MD   25 mg at 05/01/16 1039   Current Outpatient Prescriptions  Medication Sig Dispense Refill  . cloZAPine (CLOZARIL) 100 MG tablet Take 1 tablet (100 mg total) by mouth 2 (two) times daily. 60 tablet 0  .  docusate sodium (COLACE) 100 MG capsule Take 2 capsules (200 mg total) by mouth 2 (two) times daily. 10 capsule 0  . fluticasone (FLONASE) 50 MCG/ACT nasal spray Place 2 sprays into both nostrils 2 (two) times daily as needed for allergies or rhinitis. 1 g 0  . lithium carbonate (LITHOBID) 300 MG CR tablet Take 2 tablets (600 mg total) by mouth every 12 (twelve) hours. 120 tablet 0  . metFORMIN (GLUCOPHAGE) 500 MG tablet Take 1 tablet (500 mg total) by mouth 2 (two) times daily with a meal. 60 tablet 0  . metoprolol tartrate (LOPRESSOR) 25 MG tablet Take 1 tablet (25 mg total) by mouth 2 (two) times daily. 30 tablet 0    Musculoskeletal: Strength & Muscle Tone: decreased Gait & Station: unable to stand Patient leans:  N/A  Psychiatric Specialty Exam: Physical Exam  Nursing note and vitals reviewed. Constitutional: He appears well-developed and well-nourished.  HENT:  Head: Normocephalic and atraumatic.  Eyes: Conjunctivae are normal. Pupils are equal, round, and reactive to light.  Neck: Normal range of motion.  Cardiovascular: Regular rhythm and normal heart sounds.   Respiratory: Effort normal. No respiratory distress.  GI: Soft.  Musculoskeletal: Normal range of motion.  Neurological: He is alert.  Skin: Skin is warm and dry.  Psychiatric: His affect is labile. His speech is tangential. His speech is not delayed. He is hyperactive. He is not slowed. Thought content is delusional. Cognition and memory are not impaired. He expresses impulsivity.    Review of Systems  Unable to perform ROS: Patient nonverbal    Blood pressure 119/83, pulse 93, temperature 98.4 F (36.9 C), temperature source Oral, resp. rate 18, height 6' (1.829 m), weight (!) 142.9 kg (315 lb), SpO2 93 %.Body mass index is 42.72 kg/m.  General Appearance: Disheveled  Eye Contact:  Minimal  Speech:  Negative  Volume:  Decreased  Mood:  Negative  Affect:  Negative  Thought Process:  NA  Orientation:  Negative   Thought Content:  Negative  Suicidal Thoughts:  No  Homicidal Thoughts:  No  Memory:  Negative  Judgement:  Negative  Insight:  Negative  Psychomotor Activity:  Decreased  Concentration:  Concentration: Poor  Recall:  Negative  Fund of Knowledge:  Negative  Language:  Negative  Akathisia:  Negative  Handed:  Right  AIMS (if indicated):     Assets:  Housing Physical Health Resilience  ADL's:  Impaired  Cognition:  Impaired,  Mild  Sleep:        Treatment Plan Summary: Daily contact with patient to assess and evaluate symptoms and progress in treatment, Medication management and Plan No change to medication. Continue daily observation and management. I have discussed this with TTS and I think we are best off looking for placement for some kind of living situation for him.  Disposition: Supportive therapy provided about ongoing stressors.  Mordecai Rasmussen, MD 05/01/2016 8:27 PM

## 2016-05-01 NOTE — ED Notes (Signed)

## 2016-05-01 NOTE — ED Provider Notes (Signed)
-----------------------------------------   12:50 PM on 05/01/2016 -----------------------------------------   Blood pressure (!) 144/77, pulse 84, temperature 98.3 F (36.8 C), temperature source Oral, resp. rate 18, height 6' (1.829 m), weight (!) 315 lb (142.9 kg), SpO2 99 %.  The patient had no acute events since last update.  Calm and cooperative at this time.  Disposition is pending Psychiatry/Behavioral Medicine team recommendations.     Sharman CheekPhillip Zan Orlick, MD 05/01/16 1250

## 2016-05-02 DIAGNOSIS — F250 Schizoaffective disorder, bipolar type: Secondary | ICD-10-CM | POA: Diagnosis not present

## 2016-05-02 NOTE — ED Provider Notes (Signed)
  Physical Exam  BP 119/83 (BP Location: Left Arm)   Pulse 93   Temp 98.4 F (36.9 C) (Oral)   Resp 18   Ht 6' (1.829 m)   Wt (!) 315 lb (142.9 kg)   SpO2 93%   BMI 42.72 kg/m   Physical Exam  ED Course  Procedures  MDM Patient calm this AM. No issues per nursing. Pending psych placement      Charlynne Panderavid Hsienta Willadene Mounsey, MD 05/02/16 [phone]

## 2016-05-02 NOTE — ED Notes (Signed)
Breakfast brought to patient. Pt incontinent - linens changed

## 2016-05-02 NOTE — ED Notes (Signed)
Pt eating lunch

## 2016-05-02 NOTE — Consult Note (Signed)
[facility]   Reason for Consult:  Consult for 31 year old man brought from jail with unresponsiveness Referring Physician:  Derrill KayGoodman Patient Identification: Riley LamyJoseph Torres MRN:  [id number] Principal Diagnosis: Schizoaffective disorder, bipolar type [facility](HCC) Diagnosis:   Patient Active Problem List   Diagnosis Date Noted  . 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 Tachycardia [R00.0] 08/13/2015  . Tobacco use disorder [F17.200] 08/13/2015  . Schizoaffective disorder, bipolar type (HCC) [F25.0] 11/20/2012    Total Time spent with patient: 20 minutes  Subjective:   Riley LamyJoseph Torres is a 31 y.o. male patient admitted with patient didn't make any statement to me.  Follow-up for Friday the fifth. Patient appears to be improving daily. This afternoon he was up out of bed and ambulating on his own. He was able to engage in back and forth conversation for several minutes. Some of it was lucid. Other parts of it seems to still be delusional. He has not been violent and he has been cooperative with his medication. He still has episodes of looking like he is lapsing into near catatonia but I think in general he is getting better. Appears to be tolerating medicine  Follow-up for Thursday the fourth. Patient seen chart reviewed. Patient is significantly different today. He was awake and alert. He was able to interact with me. Able to answer some simple questions rationally although his thoughts were still very easily distracted and confused. He was mostly focused on dancing in his room. Made a few jokes some of them that made sense. Giggly. Not aggressive. Taking his medicine.  Follow-up for Tuesday the ninth. Patient seen. Chart reviewed. Patient's behavior is back to pretty much his normal baseline. He is playful and intermittently agitated but not threatening. Taking his medicine.  Follow-up for Wednesday the 10th. Patient seen. Case reviewed  with nursing and social work. 31 year old man with schizophrenia and mental disability. Since being back on his medicine he has come back to his usual baseline behavior. In conversation he is mostly coherent and lucid and able to have a fairly calm and appropriate conversation. He denies any suicidal or homicidal thoughts. Denies any active hallucinations. Occasionally still has slightly intrusive and playful behavior but has not been aggressive or actively dangerous. Appears to be tolerating medicine well with minor side effects. Patient at this point appears to be stable and is unlikely to benefit from any short-term or acute psychiatric hospitalization.  HPI:  Patient interviewed and observe. Chart reviewed. Case discussed with emergency room physician TTS and nursing. 31 year old man with a history of schizoaffective disorder was brought from jail. The officers from jail reported that the patient has not been eating or taking his medicine for several days. Patient was covered with urine and feces when he came into the hospital. He was fairly unresponsive. Apparently he did speak a little bit to the emergency room doctor but did not speak at all when I came in to see him. He stared at me and shook his head slightly but not in a way that allowed for any kind of real communication. Labs reviewed. His bilirubin is up a little bit nothing else really dramatic. I don't see a urine back yet. Don't have any more information about what might of recently gone on with him.  Social history: This is a patient with chronic mental illness who has had hospitalizations several times in the past and has been here for extended periods area he has a guardian and I  believe and has had diagnoses of schizoaffective disorder. Has been aggressive in the past and very disorganized in his thinking at times.  Medical history: Overweight and has some glucose intolerance. Some high blood pressure and tachycardia probably related to his  medicine.  Substance abuse history: Not a major primary problem. Has occasionally gotten some substance abuse when not supervised.  Past Psychiatric History: Patient is well known to [place] and usually presents as hyperactive and manic-like. Seems to have some degree of developmental disability but is usually able to carry on a fluid conversation. Has been on clozapine and lithium most recently as his medication.  Risk to Self: Is patient at risk for suicide?:  (Unable to assess at this time.) Risk to Others:   Prior Inpatient Therapy:   Prior Outpatient Therapy:    Past Medical History:  Past Medical History:  Diagnosis Date  . Acid reflux   . Anxiety   . Bipolar 1 disorder (HCC)   . Schizophrenia, acute (HCC)   . Seizures (HCC)     Past Surgical History:  Procedure Laterality Date  . ABDOMINAL SURGERY     Family History: No family history on file. Family Psychiatric  History: None is identified Social History:  History  Alcohol Use  . Yes     History  Drug Use No    Social History   Social History  . Marital status: Single    Spouse name: N/A  . Number of children: N/A  . Years of education: N/A   Social History Main Topics  . Smoking status: Current Every Day Smoker    Packs/day: 0.50    Types: Cigarettes  . Smokeless tobacco: None  . Alcohol use Yes  . Drug use: No  . Sexual activity: No   Other Topics Concern  . None   Social History Narrative  . None   Additional Social History:    Allergies:   Allergies  Allergen Reactions  . Peanut-Containing Drug Products   . Benadryl [Diphenhydramine Hcl] Other (See Comments)    Labs:  No results found for this or any previous visit (from the past 48 hour(s)).  Current Facility-Administered Medications  Medication Dose Route Frequency Provider Last Rate Last Dose  . cloZAPine (CLOZARIL) tablet 100 mg  100 mg Oral BID Audery Amel, MD   100 mg at 05/02/16 0852  . hydrOXYzine (ATARAX/VISTARIL) tablet 50  mg  50 mg Oral TID PRN Audery Amel, MD   50 mg at 05/01/16 1602  . ibuprofen (ADVIL,MOTRIN) tablet 400 mg  400 mg Oral Q6H PRN Audery Amel, MD   400 mg at 05/02/16 1150  . lithium carbonate capsule 300 mg  300 mg Oral BID WC Audery Amel, MD   300 mg at 05/02/16 0851  . LORazepam (ATIVAN) tablet 1 mg  1 mg Oral Q6H PRN Sharman Cheek, MD   1 mg at 05/02/16 0901  . metoprolol tartrate (LOPRESSOR) tablet 25 mg  25 mg Oral BID Audery Amel, MD   25 mg at 05/02/16 8119   Current Outpatient Prescriptions  Medication Sig Dispense Refill  . cloZAPine (CLOZARIL) 100 MG tablet Take 1 tablet (100 mg total) by mouth 2 (two) times daily. 60 tablet 0  . docusate sodium (COLACE) 100 MG capsule Take 2 capsules (200 mg total) by mouth 2 (two) times daily. 10 capsule 0  . fluticasone (FLONASE) 50 MCG/ACT nasal spray Place 2 sprays into both nostrils 2 (two) times daily as needed  for allergies or rhinitis. 1 g 0  . lithium carbonate (LITHOBID) 300 MG CR tablet Take 2 tablets (600 mg total) by mouth every 12 (twelve) hours. 120 tablet 0  . metFORMIN (GLUCOPHAGE) 500 MG tablet Take 1 tablet (500 mg total) by mouth 2 (two) times daily with a meal. 60 tablet 0  . metoprolol tartrate (LOPRESSOR) 25 MG tablet Take 1 tablet (25 mg total) by mouth 2 (two) times daily. 30 tablet 0    Musculoskeletal: Strength & Muscle Tone: decreased Gait & Station: unable to stand Patient leans: N/A  Psychiatric Specialty Exam: Physical Exam  Nursing note and vitals reviewed. Constitutional: He appears well-developed and well-nourished.  HENT:  Head: Normocephalic and atraumatic.  Eyes: Conjunctivae are normal. Pupils are equal, round, and reactive to light.  Neck: Normal range of motion.  Cardiovascular: Regular rhythm and normal heart sounds.   Respiratory: Effort normal. No respiratory distress.  GI: Soft.  Musculoskeletal: Normal range of motion.  Neurological: He is alert.  Skin: Skin is warm and dry.   Psychiatric: His affect is labile. His speech is tangential. His speech is not delayed. He is hyperactive. He is not slowed. Thought content is delusional. Cognition and memory are not impaired. He expresses impulsivity.    Review of Systems  Unable to perform ROS: Patient nonverbal    Blood pressure (!) 144/84, pulse (!) 59, temperature 98.4 F (36.9 C), temperature source Oral, resp. rate 18, height 6' (1.829 m), weight (!) 142.9 kg (315 lb), SpO2 93 %.Body mass index is 42.72 kg/m.  General Appearance: Casual  Eye Contact:  Fair  Speech:  Clear and Coherent  Volume:  Normal  Mood:  Negative  Affect:  Negative  Thought Process:  NA  Orientation:  Negative  Thought Content:  Negative  Suicidal Thoughts:  No  Homicidal Thoughts:  No  Memory:  Negative  Judgement:  Negative  Insight:  Negative  Psychomotor Activity:  Normal  Concentration:  Concentration: Fair  Recall:  [employer] of Knowledge:  Fair  Language:  Fair  Akathisia:  Negative  Handed:  Right  AIMS (if indicated):     Assets:  Housing Physical Health Resilience  ADL's:  Intact  Cognition:  Impaired,  Mild  Sleep:        Treatment Plan Summary: Daily contact with patient to assess and evaluate symptoms and progress in treatment, Medication management and Plan Tolerating clozapine and lithium. He will probably be due for a check his lithium level in another couple days. No change to medicine for today. Spoke with the patient and reviewed the plan including the rationale for seeking placement. Unfortunately it sounds like the owner of the most recent group home he was in is not willing to have him come back at this time. If we cannot find any way to place him would not be out of the question to consider referral to [facility] although it would probably be more appropriate as a fall back option given that it is more of a placement issue. Continue medication for now as well as daily supportive counseling and  evaluation.  Disposition: Supportive therapy provided about ongoing stressors.  Mordecai Rasmussen, MD 05/02/2016 1:14 PM

## 2016-05-02 NOTE — ED Notes (Signed)
Pt given toiletries, towels and clean scrubs and directed to shower
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
## 2016-05-03 DIAGNOSIS — F250 Schizoaffective disorder, bipolar type: Secondary | ICD-10-CM | POA: Diagnosis not present

## 2016-05-03 NOTE — ED Notes (Signed)
Patient given breakfast tray.
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
## 2016-05-03 NOTE — ED Notes (Signed)
Went into patient's room to give breakfast and meds.but pt reports wanting to sleep a little longer before taking meds

## 2016-05-03 NOTE — ED Notes (Signed)
Patient eating dinner.

## 2016-05-03 NOTE — ED Notes (Signed)
Patient eating lunch.

## 2016-05-03 NOTE — ED Provider Notes (Signed)
-----------------------------------------   7:07 AM on 05/03/2016 -----------------------------------------   Blood pressure 140/72, pulse 80, temperature 98.2 F (36.8 C), temperature source Oral, resp. rate 20, height 6' (1.829 m), weight (!) 315 lb (142.9 kg), SpO2 95 %.  The patient had no acute events since last update.  Calm and cooperative at this time.  Disposition is pending Psychiatry/Behavioral Medicine team recommendations.     Jennye MoccasinBrian S Trampus Mcquerry, MD 05/03/16 [phone]

## 2016-05-03 NOTE — ED Notes (Signed)
Patient given toiletries, new scrubs and towels to take shower.

## 2016-05-03 NOTE — ED Notes (Signed)
Pt is sleeping most of the evening. Pt mood is sad/depressed but he has been cooperative with staff. Writer provided nutrition and administered medications as prescribed. Pt returned to sleep and 15 minute checks are ongoing for safety.

## 2016-05-04 DIAGNOSIS — F250 Schizoaffective disorder, bipolar type: Secondary | ICD-10-CM | POA: Diagnosis not present

## 2016-05-04 MED ORDER — LORAZEPAM 2 MG PO TABS
2.0000 mg | ORAL_TABLET | Freq: Four times a day (QID) | ORAL | Status: DC | PRN
  Administered 2016-05-04 – 2016-05-17 (×21): 2 mg via ORAL
  Filled 2016-05-04 (×21): qty 1

## 2016-05-04 NOTE — ED Notes (Signed)
[facility] refferal faxed today by sw

## 2016-05-04 NOTE — Progress Notes (Signed)
LCSW and Dr Toni Amendlapacs consulted. Information was shared and [name] [facility], as he is a danger to society, threat to others in the community. LCSW will complete [facility] and  Obtain [employer] Authorization.   Delta Air LinesClaudine Holden Maniscalco LCSW [phone]

## 2016-05-04 NOTE — Progress Notes (Signed)
Faxed over [facility] Tracking Number [id number] [facility]  [facility]  [place] [facility] Awaiting a bed.  Both application and cardinal number has been received. Awaiting beds.  Delta Air LinesClaudine Darleny Sem LCSW [phone]

## 2016-05-04 NOTE — Progress Notes (Signed)
LCSW called Morrie Sheldonshley [phone] Care Coordiantor  Spoke to Larena SoxAshley Saunders from [facility]- and she will be meeting with her manager to discuss [facility]. This patient will be reviewed by the [employer] Utilization Management Department.  Will await to put a care plan together with TTS.   Delta Air LinesClaudine Daya Dutt LCSW [phone]

## 2016-05-04 NOTE — ED Notes (Signed)

## 2016-05-04 NOTE — Progress Notes (Signed)
LCSW completed Referal package for [name].  LCSW faxed request to [employer] to obtain an authorization tracking number. It was confirmed by [name] they have received it and assigned it to Health NetBill Cook. He will call shortly with authorized tracking number. Awaiting call back.  Delta Air LinesClaudine Conswella Bruney LCSW [phone]

## 2016-05-04 NOTE — Consult Note (Signed)
[facility] Face-to-Face Psychiatry Consult   Reason for Consult:  Consult for 31 year old man brought from jail with unresponsiveness Referring Physician:  Derrill Kay Patient Identification: Heber Hoog MRN:  [id number] Principal Diagnosis: Schizoaffective disorder, bipolar type [facility]) Diagnosis:   Patient Active Problem List   Diagnosis Date Noted  . 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 Tachycardia [R00.0] 08/13/2015  . Tobacco use disorder [F17.200] 08/13/2015  . Schizoaffective disorder, bipolar type (HCC) [F25.0] 11/20/2012    Total Time spent with patient: 20 minutes  Subjective:   Eland Lamantia is a 31 y.o. male patient admitted with patient didn't make any statement to me.  Follow-up for Friday the 43th. 31 year old man with schizoaffective disorder. Still in the emergency room awaiting appropriate disposition. Patient is pretty much at his baseline which means that his affect tends to be playful and a little bit labile. Sometimes hyperverbal. Has not been threatening or assaultive. Sometimes a little too intrusive. Patient is unlikely to be able to cope outside of the hospital and we are looking at referral to state hospitalization  Follow-up for Friday the fifth. Patient appears to be improving daily. This afternoon he was up out of bed and ambulating on his own. He was able to engage in back and forth conversation for several minutes. Some of it was lucid. Other parts of it seems to still be delusional. He has not been violent and he has been cooperative with his medication. He still has episodes of looking like he is lapsing into near catatonia but I think in general he is getting better. Appears to be tolerating medicine  Follow-up for Thursday the fourth. Patient seen chart reviewed. Patient is significantly different today. He was awake and alert. He was able to interact with me. Able to answer some simple questions rationally although his  thoughts were still very easily distracted and confused. He was mostly focused on dancing in his room. Made a few jokes some of them that made sense. Giggly. Not aggressive. Taking his medicine.  Follow-up for Tuesday the ninth. Patient seen. Chart reviewed. Patient's behavior is back to pretty much his normal baseline. He is playful and intermittently agitated but not threatening. Taking his medicine.  Follow-up for Wednesday the 10th. Patient seen. Case reviewed with nursing and social work. 31 year old man with schizophrenia and mental disability. Since being back on his medicine he has come back to his usual baseline behavior. In conversation he is mostly coherent and lucid and able to have a fairly calm and appropriate conversation. He denies any suicidal or homicidal thoughts. Denies any active hallucinations. Occasionally still has slightly intrusive and playful behavior but has not been aggressive or actively dangerous. Appears to be tolerating medicine well with minor side effects. Patient at this point appears to be stable and is unlikely to benefit from any short-term or acute psychiatric hospitalization.  HPI:  Patient interviewed and observe. Chart reviewed. Case discussed with emergency room physician TTS and nursing. 31 year old man with a history of schizoaffective disorder was brought from jail. The officers from jail reported that the patient has not been eating or taking his medicine for several days. Patient was covered with urine and feces when he came into the hospital. He was fairly unresponsive. Apparently he did speak a little bit to the emergency room doctor but did not speak at all when I came in to see him. He stared at me and shook his head slightly but not in a way that allowed  for any kind of real communication. Labs reviewed. His bilirubin is up a little bit nothing else really dramatic. I don't see a urine back yet. Don't have any more information about what might of recently  gone on with him.  Social history: This is a patient with chronic mental illness who has had hospitalizations several times in the past and has been here for extended periods area he has a guardian and I believe and has had diagnoses of schizoaffective disorder. Has been aggressive in the past and very disorganized in his thinking at times.  Medical history: Overweight and has some glucose intolerance. Some high blood pressure and tachycardia probably related to his medicine.  Substance abuse history: Not a major primary problem. Has occasionally gotten some substance abuse when not supervised.  Past Psychiatric History: Patient is well known to [place] and usually presents as hyperactive and manic-like. Seems to have some degree of developmental disability but is usually able to carry on a fluid conversation. Has been on clozapine and lithium most recently as his medication.  Risk to Self: Is patient at risk for suicide?:  (Unable to assess at this time.) Risk to Others:   Prior Inpatient Therapy:   Prior Outpatient Therapy:    Past Medical History:  Past Medical History:  Diagnosis Date  . Acid reflux   . Anxiety   . Bipolar 1 disorder (HCC)   . Schizophrenia, acute (HCC)   . Seizures (HCC)     Past Surgical History:  Procedure Laterality Date  . ABDOMINAL SURGERY     Family History: No family history on file. Family Psychiatric  History: None is identified Social History:  History  Alcohol Use  . Yes     History  Drug Use No    Social History   Social History  . Marital status: Single    Spouse name: N/A  . Number of children: N/A  . Years of education: N/A   Social History Main Topics  . Smoking status: Current Every Day Smoker    Packs/day: 0.50    Types: Cigarettes  . Smokeless tobacco: None  . Alcohol use Yes  . Drug use: No  . Sexual activity: No   Other Topics Concern  . None   Social History Narrative  . None   Additional Social History:     Allergies:   Allergies  Allergen Reactions  . Peanut-Containing Drug Products   . Benadryl [Diphenhydramine Hcl] Other (See Comments)    Labs:  No results found for this or any previous visit (from the past 48 hour(s)).  Current Facility-Administered Medications  Medication Dose Route Frequency Provider Last Rate Last Dose  . cloZAPine (CLOZARIL) tablet 100 mg  100 mg Oral BID Audery Amel, MD   100 mg at 05/04/16 1150  . hydrOXYzine (ATARAX/VISTARIL) tablet 50 mg  50 mg Oral TID PRN Audery Amel, MD   50 mg at 05/04/16 1150  . ibuprofen (ADVIL,MOTRIN) tablet 400 mg  400 mg Oral Q6H PRN Audery Amel, MD   400 mg at 05/03/16 1206  . lithium carbonate capsule 300 mg  300 mg Oral BID WC Audery Amel, MD   300 mg at 05/04/16 1150  . LORazepam (ATIVAN) tablet 2 mg  2 mg Oral Q6H PRN Audery Amel, MD      . metoprolol tartrate (LOPRESSOR) tablet 25 mg  25 mg Oral BID Audery Amel, MD   25 mg at 05/04/16 1255   Current  Outpatient Prescriptions  Medication Sig Dispense Refill  . clonazePAM (KLONOPIN) 1 MG tablet Take 1 mg by mouth at bedtime.    . divalproex (DEPAKOTE) 500 MG DR tablet Take 1,500 mg by mouth at bedtime.    . haloperidol (HALDOL) 2 MG tablet Take 2 mg by mouth 3 (three) times daily.    [name] LORazepam (ATIVAN) 1 MG tablet Take 1 mg by mouth daily as needed for anxiety.    . cloZAPine (CLOZARIL) 100 MG tablet Take 1 tablet (100 mg total) by mouth 2 (two) times daily. 60 tablet 0  . docusate sodium (COLACE) 100 MG capsule Take 2 capsules (200 mg total) by mouth 2 (two) times daily. 10 capsule 0  . fluticasone (FLONASE) 50 MCG/ACT nasal spray Place 2 sprays into both nostrils 2 (two) times daily as needed for allergies or rhinitis. 1 g 0  . lithium carbonate (LITHOBID) 300 MG CR tablet Take 2 tablets (600 mg total) by mouth every 12 (twelve) hours. 120 tablet 0  . metFORMIN (GLUCOPHAGE) 500 MG tablet Take 1 tablet (500 mg total) by mouth 2 (two) times daily with a meal.  60 tablet 0  . metoprolol tartrate (LOPRESSOR) 25 MG tablet Take 1 tablet (25 mg total) by mouth 2 (two) times daily. 30 tablet 0    Musculoskeletal: Strength & Muscle Tone: decreased Gait & Station: unable to stand Patient leans: N/A  Psychiatric Specialty Exam: Physical Exam  Nursing note and vitals reviewed. Constitutional: He appears well-developed and well-nourished.  HENT:  Head: Normocephalic and atraumatic.  Eyes: Conjunctivae are normal. Pupils are equal, round, and reactive to light.  Neck: Normal range of motion.  Cardiovascular: Regular rhythm and normal heart sounds.   Respiratory: Effort normal. No respiratory distress.  GI: Soft.  Musculoskeletal: Normal range of motion.  Neurological: He is alert.  Skin: Skin is warm and dry.  Psychiatric: His affect is labile. His speech is tangential. His speech is not delayed. He is hyperactive. He is not slowed. Thought content is delusional. Cognition and memory are not impaired. He expresses impulsivity.    Review of Systems  Unable to perform ROS: Patient nonverbal    Blood pressure (!) 142/79, pulse 85, temperature 98.2 F (36.8 C), temperature source Oral, resp. rate 20, height 6' (1.829 m), weight (!) 142.9 kg (315 lb), SpO2 99 %.Body mass index is 42.72 kg/m.  General Appearance: Casual  Eye Contact:  Fair  Speech:  Clear and Coherent  Volume:  Normal  Mood:  Negative  Affect:  Negative  Thought Process:  NA  Orientation:  Negative  Thought Content:  Negative  Suicidal Thoughts:  No  Homicidal Thoughts:  No  Memory:  Negative  Judgement:  Negative  Insight:  Negative  Psychomotor Activity:  Normal  Concentration:  Concentration: Fair  Recall:  [employer] of Knowledge:  Fair  Language:  Fair  Akathisia:  Negative  Handed:  Right  AIMS (if indicated):     Assets:  Housing Physical Health Resilience  ADL's:  Intact  Cognition:  Impaired,  Mild  Sleep:        Treatment Plan Summary: Daily contact  with patient to assess and evaluate symptoms and progress in treatment, Medication management and Plan Orders completed to check lithium and clozapine levels tomorrow morning. Ativan order adjusted to make it as needed for agitation. Case reviewed today with nursing and social work who have completed forms for referral to the state hospital for longer term care.  Disposition:  Supportive therapy provided about ongoing stressors.  Mordecai RasmussenJohn Cari Burgo, MD 05/04/2016 3:35 PM

## 2016-05-04 NOTE — ED Notes (Signed)
Report was received from Verdis Fredericksonuth B., RN; Pt. Verbalizes no complaints or distress; denies S.I./Hi. Earlier c/o having anxiety; received PRN medication;  Continue to monitor with 15 min. Monitoring.

## 2016-05-04 NOTE — ED Notes (Signed)
PT IVC STILL PENDING PLACEMENT

## 2016-05-04 NOTE — ED Provider Notes (Signed)
-----------------------------------------   6:31 AM on 05/04/2016 -----------------------------------------   Blood pressure (!) 109/46, pulse 86, temperature 98.4 F (36.9 C), temperature source Oral, resp. rate 18, height 6' (1.829 m), weight (!) 315 lb (142.9 kg), SpO2 98 %.  The patient had no acute events since last update.  Calm and cooperative at this time.  Disposition is pending Psychiatry/Behavioral Medicine team recommendations.     Irean HongJade J Ercilia Bettinger, MD 05/04/16 [phone]

## 2016-05-04 NOTE — ED Notes (Signed)
Pharmacy called and states we need to get a cbc on this pt for his clozaril to continue it was ordered yesterday .when asking pt about lab work pt refuses . Dr Toni Amendlapacs notifed.

## 2016-05-05 DIAGNOSIS — F250 Schizoaffective disorder, bipolar type: Secondary | ICD-10-CM | POA: Diagnosis not present

## 2016-05-05 LAB — LITHIUM LEVEL: Lithium Lvl: 0.27 mmol/L — ABNORMAL LOW (ref 0.60–1.20)

## 2016-05-05 NOTE — ED Notes (Signed)
Maintained on 15 minute checks and observation by security camera for safety. 

## 2016-05-05 NOTE — ED Notes (Signed)
Patient awake, alert, and oriented. Cooperative with nursing interventions. Ate breakfast. Taking adequate fluids.  Patient took a shower. Maintained on 15 minute checks and observation by security camera for safety.

## 2016-05-05 NOTE — ED Notes (Signed)
Patient resting quietly in room. No noted distress or abnormal behaviors noted. Will continue 15 minute checks and observation by security camera for safety. 

## 2016-05-05 NOTE — ED Notes (Signed)
ivc /placement pending 

## 2016-05-05 NOTE — ED Notes (Signed)
Patient received lunch tray and beverage. 

## 2016-05-05 NOTE — ED Notes (Signed)
Patient asleep in room. No noted distress or abnormal behavior. Will continue 15 minute checks and observation by security cameras for safety. Breakfast tray left at bedside. 

## 2016-05-05 NOTE — ED Provider Notes (Signed)
-----------------------------------------   7:38 AM on 05/05/2016 -----------------------------------------   Blood pressure (!) 142/79, pulse 85, temperature 98.2 F (36.8 C), temperature source Oral, resp. rate 20, height 6' (1.829 m), weight (!) 315 lb (142.9 kg), SpO2 99 %.  The patient had no acute events since last update.  Calm and cooperative at this time.  Disposition is pending Psychiatry/Behavioral Medicine team recommendations.     Jeanmarie PlantJames A Aniel Hubble, MD 05/05/16 [phone]

## 2016-05-05 NOTE — ED Notes (Signed)

## 2016-05-05 NOTE — ED Notes (Signed)
Patient received meal tray and beverage. 

## 2016-05-05 NOTE — ED Notes (Signed)

## 2016-05-05 NOTE — ED Notes (Signed)
Patient in dayroom. Given snack and beverage. Maintained on 15 minute checks and observation by security camera for safety.

## 2016-05-05 NOTE — ED Notes (Signed)
Report was received from Amy H., RN; Pt. Verbalizes no complaints or distress; denies S.I./Hi. Continue to monitor with 15 min. Monitoring. 

## 2016-05-05 NOTE — ED Notes (Signed)
Patient self occupied in the common area. Watching music channel and dancing. Appropriate behavior with staff. Maintained on 15 minute checks and observation by security camera for safety.

## 2016-05-06 DIAGNOSIS — F250 Schizoaffective disorder, bipolar type: Secondary | ICD-10-CM | POA: Diagnosis not present

## 2016-05-06 NOTE — ED Notes (Signed)
Report was received from Amy H., RN; Pt. Verbalizes no complaints or distress; denies S.I./Hi. Continue to monitor with 15 min. Monitoring. 

## 2016-05-06 NOTE — ED Notes (Signed)
PT IVC STILL PENDING PLACEMENT  

## 2016-05-06 NOTE — ED Notes (Signed)

## 2016-05-06 NOTE — ED Notes (Signed)
Patient states he feels "agitated" although cannot describe any symptoms. He is also requesting a snack. He was told that he just received meal tray and he could not have a snack at this time. Patient given Atarax per MD order to reduce anxiety. Will continue to monitor. Maintained on 15 minute checks and observation by security camera for safety.

## 2016-05-06 NOTE — Progress Notes (Signed)
[facility] no [facility] today.   Delta Air LinesClaudine Val Schiavo LCSW [phone]

## 2016-05-06 NOTE — ED Notes (Signed)
Patient watching television. He is loudly laughing and at times may be responding to internal stimuli; but patient denies. Given a snack. Appetite remains good with adequate fluid intake.Maintained on 15 minute checks and observation by security camera for safety.

## 2016-05-06 NOTE — ED Notes (Signed)
Patient given snack and beverage.  

## 2016-05-06 NOTE — ED Notes (Signed)
Patient has received PM snack. 

## 2016-05-06 NOTE — ED Notes (Signed)
Patient received meal tray and beverage. 

## 2016-05-06 NOTE — ED Notes (Signed)
Patient asleep in room. No noted distress or abnormal behavior. Will continue 15 minute checks and observation by security cameras for safety. 

## 2016-05-06 NOTE — ED Notes (Signed)
Patient in common area, watching television. In NAD. Maintained on 15 minute checks and observation by security camera for safety.

## 2016-05-06 NOTE — ED Notes (Signed)
Patient states he has been having daily bowel movements.

## 2016-05-06 NOTE — ED Notes (Signed)
Patient reports feeling less agitated. Watching television and socializing with staff. Maintained on 15 minute checks and observation by security camera for safety.

## 2016-05-06 NOTE — ED Notes (Signed)
ED BHU PLACEMENT JUSTIFICATION Is the patient under IVC or is there intent for IVC: Yes.   Is the patient medically cleared: Yes.   Is there vacancy in the ED BHU: Yes.   Is the population mix appropriate for patient: Yes.   Is the patient awaiting placement in inpatient or outpatient setting: Yes.   Has the patient had a psychiatric consult: Yes.   Survey of unit performed for contraband, proper placement and condition of furniture, tampering with fixtures in bathroom, shower, and each patient room: Yes.   APPEARANCE/BEHAVIOR cooperative and adequate rapport can be established NEURO ASSESSMENT Orientation: time, place and person Hallucinations: No.None noted (Hallucinations) Speech: Normal Gait: normal RESPIRATORY ASSESSMENT Normal expansion.  Clear to auscultation.  No rales, rhonchi, or wheezing. CARDIOVASCULAR ASSESSMENT regular rate and rhythm, S1, S2 normal, no murmur, click, rub or gallop GASTROINTESTINAL ASSESSMENT soft, nontender, BS WNL, no r/g EXTREMITIES normal strength, tone, and muscle mass PLAN OF CARE Provide calm/safe environment. Vital signs assessed twice daily. ED BHU Assessment once each 12-hour shift. Collaborate with intake RN daily or as condition indicates. Assure the ED provider has rounded once each shift. Provide and encourage hygiene. Provide redirection as needed. Assess for escalating behavior; address immediately and inform ED provider.  Assess family dynamic and appropriateness for visitation as needed: Yes.   Educate the patient/family about BHU procedures/visitation: Yes.   

## 2016-05-06 NOTE — ED Notes (Signed)
Patient asleep in room. No noted distress or abnormal behavior. Will continue 15 minute checks and observation by security cameras for safety.  Meal tray left at bedside. 

## 2016-05-06 NOTE — ED Notes (Signed)
Patient received supper tray. 

## 2016-05-06 NOTE — ED Provider Notes (Signed)
-----------------------------------------   7:09 AM on 05/06/2016 -----------------------------------------   Blood pressure (!) 119/56, pulse (!) 58, temperature 97.8 F (36.6 C), temperature source Oral, resp. rate 18, height 6' (1.829 m), weight (!) 315 lb (142.9 kg), SpO2 100 %.  The patient had no acute events since last update.  Calm and cooperative at this time.  [facility]     Rebecka ApleyAllison P Webster, MD 05/06/16 [phone]

## 2016-05-06 NOTE — ED Notes (Signed)
Patient requested a shower.

## 2016-05-07 DIAGNOSIS — F250 Schizoaffective disorder, bipolar type: Secondary | ICD-10-CM | POA: Diagnosis not present

## 2016-05-07 LAB — CLOZAPINE (CLOZARIL)
CLOZAPINE LVL: 81 ng/mL — AB (ref 350–650)
NORCLOZAPINE: NOT DETECTED ng/mL
TOTAL(CLOZ+ NORCLOZ): UNDETERMINED ng/mL

## 2016-05-07 NOTE — ED Notes (Signed)
Pt continues to be cooperative. Pt was dancing in dayroom and speaking with other patients. No concerns voiced. Maintained on 15 minute checks and observation by security camera for safety.

## 2016-05-07 NOTE — ED Notes (Signed)
Patient asleep in room. No noted distress or abnormal behavior. Will continue 15 minute checks and observation by security cameras for safety. 

## 2016-05-07 NOTE — ED Notes (Signed)
Pt told RN, "I need to get out of here. I want to go to the floor I was on last time."  RN reassured pt staff was working on finding him placement. Pt redirected, remains calm.   Pt in dayroom watching TV. Maintained on 15 minute checks and observation by security camera for safety.

## 2016-05-07 NOTE — ED Notes (Signed)
Pt has taken a shower. Pt ate 100% of breakfast. Pt has been calm and appropriate with staff. Maintained on 15 minute checks and observation by security camera for safety.

## 2016-05-07 NOTE — ED Notes (Signed)
Patient anxious.  Mood is labile.

## 2016-05-07 NOTE — ED Notes (Addendum)
Pt stated to security his anxiety level was increasing. RN administered ordered PRN medication.  Maintained on 15 minute checks and observation by security camera for safety.

## 2016-05-07 NOTE — ED Provider Notes (Signed)
-----------------------------------------   6:55 AM on 05/07/2016 -----------------------------------------   Blood pressure (!) 106/54, pulse 67, temperature 98 F (36.7 C), temperature source Oral, resp. rate 18, height 6' (1.829 m), weight (!) 315 lb (142.9 kg), SpO2 100 %.  The patient had no acute events since last update.  Calm and cooperative at this time.  [facility]     Rebecka ApleyAllison P Webster, MD 05/07/16 [phone]

## 2016-05-08 DIAGNOSIS — F250 Schizoaffective disorder, bipolar type: Secondary | ICD-10-CM | POA: Diagnosis not present

## 2016-05-08 MED ORDER — SULFAMETHOXAZOLE-TRIMETHOPRIM 800-160 MG PO TABS
1.0000 | ORAL_TABLET | Freq: Two times a day (BID) | ORAL | Status: AC
  Administered 2016-05-08 – 2016-05-11 (×6): 1 via ORAL
  Filled 2016-05-08 (×7): qty 1

## 2016-05-08 NOTE — ED Notes (Signed)
Pt seems confused, unable to stay focused. Appears to be reacting to internal stimuli. PRN medications have been administered as ordered.  Pt continues to seek the attention of staff. Maintained on 15 minute checks and observation by security camera for safety.

## 2016-05-08 NOTE — ED Notes (Signed)
Pt watching TV in dayroom with other patients. Pt given PRN medications for anxiety and pain in his legs. Pt remains cooperative, but makes many request of staff for snacks and drinks. Maintained on 15 minute checks and observation by security camera for safety.

## 2016-05-08 NOTE — ED Provider Notes (Signed)
-----------------------------------------   4:04 PM on 05/08/2016 -----------------------------------------  Patient has small area to his right face most consistent with pustular acne/small cyst. Approximately 0.5 cm in diameter. We will cover with a short course of antibiotics. I anticipate this will resolve on its own. Discussed with the nurse and we will evaluate again if needed for any worsening.   Minna AntisKevin Arthelia Callicott, MD 05/08/16 [phone]

## 2016-05-08 NOTE — ED Notes (Signed)
PT IVC/DR CLAPACS RENEWED IVC PAPERS/PT STILL PENDING PLACEMENT.

## 2016-05-08 NOTE — ED Notes (Signed)
Pt becoming more paranoid. Pt telling RN he is tired of people messing with him. Pt made comments about "swinging" on people. Pt punching his fist into his other hand while speaking. Pt was deescalated verbally by Engineer, materialssecurity officer. Psychiatrist will be notified of current behaviors. Maintained on 15 minute checks and observation by security camera for safety.

## 2016-05-08 NOTE — ED Notes (Signed)
CSW called [facility] [phone]). Pt remains on wait list.   CS[facility]ld[facility](Ashley [phone]). Morrie Sheldonshley states that pt remains on the adult residential registry and is awaiting a bed at a group living [facility] facility. CSW requested a list of group living high facilities to help make additional referrals. Morrie Sheldonshley states that she will provide CSW with facility list by tomorrow.   CSW will continue to follow pt and assist as needed.  Jonathon JordanLynn B Jazlyne Gauger, MSW, Theresia MajorsLCSWA [phone]

## 2016-05-08 NOTE — ED Provider Notes (Signed)
-----------------------------------------   7:09 AM on 05/08/2016 -----------------------------------------   Blood pressure 128/80, pulse 86, temperature 98 F (36.7 C), temperature source Oral, resp. rate 18, height 6' (1.829 m), weight (!) 315 lb (142.9 kg), SpO2 97 %.  The patient had no acute events since last update.  Calm and cooperative at this time.  Disposition is pending Psychiatry/Behavioral Medicine team recommendations.     Jennye MoccasinBrian S Aleicia Kenagy, MD 05/08/16 [phone]

## 2016-05-09 DIAGNOSIS — F250 Schizoaffective disorder, bipolar type: Secondary | ICD-10-CM | POA: Diagnosis not present

## 2016-05-09 NOTE — ED Notes (Signed)
Throughout the night patient had episodes of paranoia stating, "I know what's going on around here.  I have to pay attention.  Everything isn't what it seems."  Patient later came to the door cursing stating, "What the fuck."  Patient responded well to redirection and was able to be deescalated by staff.

## 2016-05-09 NOTE — ED Notes (Signed)
PT  IVC (3RD SET)  ON  [facility] WAITLIST 

## 2016-05-09 NOTE — ED Notes (Signed)
Patient was agitated that his meal was late. Seen by staff to be visibly upset (cursing, slamming door). Meal arrived and he immediately calmed down.

## 2016-05-09 NOTE — BH Assessment (Signed)
Confirmed with [facility] ([place]-[phone]), patient remain on their Wait List. 

## 2016-05-09 NOTE — ED Provider Notes (Addendum)
Vitals:   05/08/16 1152 05/08/16 2230  BP: 122/73 128/80  Pulse: 88 80  Resp: 18   Temp:      No events overnight, patient remains medically stable for psychiatric disposition   Emily FilbertJonathan E Williams, MD 05/09/16 0715    Emily FilbertJonathan E Williams, MD 05/09/16 [phone]

## 2016-05-10 DIAGNOSIS — F250 Schizoaffective disorder, bipolar type: Secondary | ICD-10-CM | POA: Diagnosis not present

## 2016-05-10 LAB — INFLUENZA PANEL BY PCR (TYPE A & B)
INFLAPCR: NEGATIVE
Influenza B By PCR: NEGATIVE

## 2016-05-10 NOTE — ED Provider Notes (Signed)
-----------------------------------------   4:33 AM on 05/10/2016 -----------------------------------------   BP 120/82   Pulse 88   Temp 97.5 F (36.4 C) (Oral)   Resp 18   Ht 6' (1.829 m)   Wt (!) 315 lb (142.9 kg)   SpO2 100%   BMI 42.72 kg/m   No acute events since last update.  Disposition is pending per Psychiatry/Behavioral Medicine team recommendations.     Phineas SemenGraydon Kamaal Cast, MD 05/10/16 [phone]

## 2016-05-10 NOTE — ED Notes (Signed)
Patient brought back to BHU escorted by police and staff.

## 2016-05-10 NOTE — ED Notes (Signed)
Pt. Alert and oriented, warm and dry, in no distress. Pt. Denies SI, HI, and AVH. Pt. Encouraged to let nursing staff know of any concerns or needs. 

## 2016-05-10 NOTE — BH Assessment (Signed)
Confirmed with [facility](Connie-[phone]), patientremain on their WaitList. 

## 2016-05-10 NOTE — ED Notes (Signed)
PT IVC/ PENDING PLACEMENT  

## 2016-05-10 NOTE — ED Notes (Signed)
Patient requesting medication early.  Patient paranoid stating, "These people keep playing with me.  I know what's going on."

## 2016-05-10 NOTE — ED Notes (Signed)
ED doctor informed of pt's temperature. Patient to be moved to Bed 25 in ED to be seen by Dr.

## 2016-05-10 NOTE — ED Notes (Signed)
Pt showered and given new scrubs and socks

## 2016-05-10 NOTE — ED Notes (Signed)

## 2016-05-10 NOTE — ED Notes (Signed)
CBC specimen sent to lab with "Save tube" label attached. Advised by lab to do so because [employer] will not print CBC label.

## 2016-05-10 NOTE — ED Notes (Signed)

## 2016-05-10 NOTE — ED Notes (Signed)
Patient escorted by police and staff to Bed 25 to be seen by ED doctor. Patient examined but became aggressive and refused rapid influenza test by ER doctor and nurse.

## 2016-05-10 NOTE — ED Notes (Signed)
Pt remains on [facility] wait list.   CSW engaged with pt in BHU dayroom. Pt presented with a flat affect and states that he isn't feeling well. "I'm getting tired of being here". CSW provided emotional support and understanding. CSW will continue to follow pt and assist as needed.  Riley JordanLynn B Donold Torres, MSW, Theresia MajorsLCSWA [phone]

## 2016-05-10 NOTE — ED Notes (Signed)
Snacks given with soda

## 2016-05-10 NOTE — ED Notes (Signed)
Patient incontinent during the night. Linens changed

## 2016-05-10 NOTE — ED Provider Notes (Signed)
-----------------------------------------   11:43 AM on 05/10/2016 -----------------------------------------  Nurse notified me the patient is mildly febrile.  Nurse notes he has not been complaining of anything except for a slight runny nose has been noted.  I personally saw, examined and evaluated the patient at 12:20 PM  The patient is nontoxic, seated in chair well appearing. Well built. The patient states he does not have any symptoms, and does not realize he even had a fever. He appears quite well. He has had a little bit of a scratchy throat but reports this is been due to "allergies" and little bit of a runny nose.  On exam the patient is awake and alert Resting comfortably in no distress His lungs are clear, breathing comfortably without any wheezing. Normal respiratory rate speaking in full sentences. No cervical adenopathy. No meningismus. No tonsillar hypertrophy or exudates. Normal heart tones without any murmur Abdomen soft nontender nondistended Patient ambulatory with no distress ----------------------------------------- 12:24 PM on 05/10/2016 -----------------------------------------  The patient seems to have very mild upper respiratory type symptoms including a mild runny nose and slight scratchy throat. No evidence of acute pharyngitis by exam. Given the season allergy I will send a flu test, if this is negative I most suspect likely a mild viral process. We will continue to follow the patient clinically well he is here in the emergency room/BHU. The present time I find no indication for further testing or antibiotics, but we'll continue to monitor for symptoms or change.  Flu neg.  ----------------------------------------- 3:52 PM on 05/10/2016 -----------------------------------------  Patient clinical condition, low-grade fever, and follow-up on CBC for Clozaril (spelling) monitoring discussed with Dr. Darnelle CatalanMalinda the oncoming physician   Sharyn CreamerMark Quale, MD 05/10/16  [phone]

## 2016-05-11 DIAGNOSIS — F250 Schizoaffective disorder, bipolar type: Secondary | ICD-10-CM | POA: Diagnosis not present

## 2016-05-11 NOTE — ED Notes (Signed)
Pt. Alert and oriented, warm and dry, in no distress. Pt. Denies SI, HI, and AVH. Pt. Encouraged to let nursing staff know of any concerns or needs. 

## 2016-05-11 NOTE — ED Notes (Signed)

## 2016-05-11 NOTE — ED Notes (Signed)
Pt has spent most of his time in his room. Pt does continue to ask all staff for additional snacks and drinks.  Maintained on 15 minute checks and observation by security camera for safety.

## 2016-05-11 NOTE — ED Notes (Signed)
Pt has taken a shower.  Maintained on 15 minute checks and observation by security camera for safety. 

## 2016-05-11 NOTE — Progress Notes (Signed)
LCSW called [facility] Larena Soxshley Saunders [phone] awaiting call back.  Delta Air LinesClaudine Apryl Brymer LCSW [phone]

## 2016-05-11 NOTE — ED Notes (Signed)
Patient asleep in room. No noted distress or abnormal behavior. Will continue 15 minute checks and observation by security cameras for safety. 

## 2016-05-11 NOTE — ED Notes (Signed)
Pt requested a shower. Supplies provided.

## 2016-05-11 NOTE — ED Notes (Signed)
Pt in dayroom watching TV.  Pt asking for snacks and drinks.  Pt given drink, told to hold off on having a snack.  Pt accepting. Maintained on 15 minute checks and observation by security camera for safety.

## 2016-05-11 NOTE — ED Notes (Signed)
Sandwich and soft drink given.  

## 2016-05-11 NOTE — Progress Notes (Signed)
LCSW called [facility] and NO beds currently as per Connie   Lorne Winkels LCSW [phone] 

## 2016-05-11 NOTE — ED Provider Notes (Signed)
-----------------------------------------   7:54 AM on 05/11/2016 -----------------------------------------   Blood pressure (!) 143/63, pulse 92, temperature 98.7 F (37.1 C), temperature source Oral, resp. rate 16, height 6' (1.829 m), weight (!) 315 lb (142.9 kg), SpO2 98 %.  The patient had no acute events since last update.  Calm and cooperative at this time.  Disposition is pending Psychiatry/Behavioral Medicine team recommendations.     Rebecka ApleyAllison P Nashali Ditmer, MD 05/11/16 [phone]

## 2016-05-12 DIAGNOSIS — F250 Schizoaffective disorder, bipolar type: Secondary | ICD-10-CM | POA: Diagnosis not present

## 2016-05-12 LAB — CBC WITH DIFFERENTIAL/PLATELET
BASOS ABS: 0 10*3/uL (ref 0–0.1)
Basophils Relative: 1 %
EOS PCT: 3 %
Eosinophils Absolute: 0.1 10*3/uL (ref 0–0.7)
HCT: 41.3 % (ref 40.0–52.0)
HEMOGLOBIN: 13.4 g/dL (ref 13.0–18.0)
Lymphocytes Relative: 38 %
Lymphs Abs: 1.7 10*3/uL (ref 1.0–3.6)
MCH: 28.7 pg (ref 26.0–34.0)
MCHC: 32.4 g/dL (ref 32.0–36.0)
MCV: 88.5 fL (ref 80.0–100.0)
Monocytes Absolute: 0.7 10*3/uL (ref 0.2–1.0)
Monocytes Relative: 15 %
NEUTROS PCT: 43 %
Neutro Abs: 2 10*3/uL (ref 1.4–6.5)
Platelets: 350 10*3/uL (ref 150–440)
RBC: 4.67 MIL/uL (ref 4.40–5.90)
RDW: 13.9 % (ref 11.5–14.5)
WBC: 4.5 10*3/uL (ref 3.8–10.6)

## 2016-05-12 NOTE — ED Notes (Signed)

## 2016-05-12 NOTE — ED Notes (Signed)
Patient resting quietly in room. No noted distress or abnormal behaviors noted. Will continue 15 minute checks and observation by security camera for safety. 

## 2016-05-12 NOTE — Consult Note (Signed)
Pt ordered clozapine. This is home med. Pt is registered and eligible per [facility] website. Weekly labs while inpatient. Last last 1/2. Submitted to [facility]. Next lab 05/01/16  Note-Patient had been refusing labs.  1/20 ANC= 2.0    Submitted to [facility] website. Continue weekly ANC, next 05/19/16.   Bari MantisKristin Hamzah Savoca PharmD Clinical Pharmacist 05/12/2016

## 2016-05-12 NOTE — ED Notes (Signed)

## 2016-05-12 NOTE — ED Notes (Signed)
Patient asleep in room. No noted distress or abnormal behavior. Will continue 15 minute checks and observation by security cameras for safety. 

## 2016-05-12 NOTE — ED Notes (Signed)
Patient somewhat attention seeking with staff but responsive to redirection. Maintained on 15 minute checks and observation by security camera for safety.

## 2016-05-12 NOTE — ED Notes (Signed)
Ivc /pending placement 

## 2016-05-12 NOTE — ED Notes (Signed)
Patient received meal tray and beverage. 

## 2016-05-12 NOTE — ED Notes (Signed)
Patient singing and dancing in common area. Listening to music channel. Social with staff. Maintained on 15 minute checks and observation by security camera for safety.

## 2016-05-12 NOTE — ED Notes (Signed)
Patient received snack tray. Patient has small reddened area approximately 1" x 1" inch on heel of right foot. No s/s infection. Area cleansed and bandage applied. Will continue to monitor.

## 2016-05-12 NOTE — ED Notes (Signed)

## 2016-05-12 NOTE — ED Notes (Addendum)
Patient awake. He received breakfast tray and took all medications. Patient was incontinent during the night. He took a shower.  Bed cleaned and all linens changed. Maintained on 15 minute checks and observation by security camera for safety.

## 2016-05-12 NOTE — ED Notes (Signed)
Patient has remained in good behavioral control through shift with some minor limit setting from staff. Maintained on 15 minute checks and observation by security camera for safety.

## 2016-05-12 NOTE — ED Notes (Signed)
Pt. Alert and oriented, warm and dry, in no distress. Pt. Denies SI, HI, and AVH. Pt. Encouraged to let nursing staff know of any concerns or needs. 

## 2016-05-12 NOTE — ED Provider Notes (Signed)
-----------------------------------------   7:37 AM on 05/12/2016 -----------------------------------------   Blood pressure (!) 155/73, pulse 82, temperature 98.2 F (36.8 C), temperature source Oral, resp. rate 18, height 6' (1.829 m), weight (!) 315 lb (142.9 kg), SpO2 100 %.  The patient had no acute events since last update.  Calm and cooperative at this time.  Disposition is pending Psychiatry/Behavioral Medicine team recommendations.     Irean HongJade J Sung, MD 05/12/16 [phone]

## 2016-05-13 DIAGNOSIS — F250 Schizoaffective disorder, bipolar type: Secondary | ICD-10-CM | POA: Diagnosis not present

## 2016-05-13 NOTE — ED Notes (Signed)
Patient awake and alert. He took all his medications without incident.  Maintained on 15 minute checks and observation by security camera for safety.

## 2016-05-13 NOTE — ED Notes (Signed)
Patient in common area watching television. In NAD. Maintained on 15 minute checks and observation by security camera for safety.

## 2016-05-13 NOTE — ED Notes (Signed)
Patient asleep in room. No noted distress or abnormal behavior. Will continue 15 minute checks and observation by security cameras for safety. Breakfast tray left at bedside. 

## 2016-05-13 NOTE — ED Notes (Signed)

## 2016-05-13 NOTE — ED Notes (Addendum)
Patient out in common area. Social with other patients.  Maintained on 15 minute checks and observation by security camera for safety.

## 2016-05-13 NOTE — ED Notes (Signed)
Patient asleep in room. No noted distress or abnormal behavior. Will continue 15 minute checks and observation by security cameras for safety. 

## 2016-05-13 NOTE — ED Notes (Signed)

## 2016-05-13 NOTE — ED Notes (Signed)

## 2016-05-13 NOTE — ED Notes (Signed)
Patient resting quietly in room. No noted distress or abnormal behaviors noted. Will continue 15 minute checks and observation by security camera for safety. 

## 2016-05-13 NOTE — ED Notes (Signed)
Patient with increasing agitation secondary to not getting requests met immediately. He is pacing and talking loudly. He agreed to take PRN medications to assist with behavioral control.

## 2016-05-13 NOTE — ED Provider Notes (Signed)
-----------------------------------------   6:10 AM on 05/13/2016 -----------------------------------------   Blood pressure 128/80, pulse 80, temperature 98.4 F (36.9 C), temperature source Oral, resp. rate 18, height 6' (1.829 m), weight (!) 315 lb (142.9 kg), SpO2 97 %.  The patient had no acute events since last update.  Calm and cooperative at this time.  Disposition is pending Psychiatry/Behavioral Medicine team recommendations.     Irean HongJade J Amauri Medellin, MD 05/13/16 [phone]

## 2016-05-13 NOTE — ED Notes (Signed)
Maintained on 15 minute checks and observation by security camera for safety. 

## 2016-05-13 NOTE — ED Notes (Signed)
Two Sandwiches and a banana, bag of chips, and two packs of graham crackers and soft drink given for evening snack.

## 2016-05-13 NOTE — ED Notes (Signed)
Pt. Alert and oriented, warm and dry, in no distress. Pt. Denies SI, HI, and AVH. Pt. Encouraged to let nursing staff know of any concerns or needs. 

## 2016-05-13 NOTE — ED Notes (Signed)
Patient become upset over snack and went to room and slammed door. This Clinical research associatewriter redirected patient on not slamming the doors. Patient in room eating snack.

## 2016-05-13 NOTE — ED Notes (Signed)
This Clinical research associatewriter advised patient go ahead and try to lay down and get some rest so he would be able to get up in time to eat a hot breakfast. Patient currently is complying and laying down in bed watching tv.

## 2016-05-13 NOTE — ED Notes (Signed)
Patient able to remain in behavioral control with continued limit setting. He took a shower.

## 2016-05-13 NOTE — ED Notes (Signed)
Patient received meal tray and beverage. 

## 2016-05-14 DIAGNOSIS — F250 Schizoaffective disorder, bipolar type: Secondary | ICD-10-CM | POA: Diagnosis not present

## 2016-05-14 NOTE — ED Notes (Signed)
Patient had supper tray, ate 100%. Patient without any behavioral issues at this time. Patient with q 15 minute checks and camera monitoring.

## 2016-05-14 NOTE — ED Notes (Signed)
Patient ate cereal for lunch, did not want his lunch tray.

## 2016-05-14 NOTE — ED Provider Notes (Signed)
-----------------------------------------   7:16 AM on 05/14/2016 -----------------------------------------   Blood pressure 136/71, pulse 91, temperature 98.2 F (36.8 C), temperature source Oral, resp. rate 18, height 6' (1.829 m), weight (!) 142.9 kg, SpO2 100 %.  The patient had no acute events since last update.  Calm and cooperative at this time.  Disposition is pending Psychiatry/Behavioral Medicine team recommendations.     Loleta Roseory Reannon Candella, MD 05/14/16 [phone]

## 2016-05-14 NOTE — ED Notes (Signed)
Patient is calm, states that ativan helped with anxiety.

## 2016-05-14 NOTE — ED Notes (Signed)
Patient complained of headache, and nurse administered motrin 400mg  po, Patient is calm, no behaviors noted. q 15 minute checks and camera monitoring in progress.

## 2016-05-14 NOTE — ED Notes (Signed)
Patient ate 100% of breakfast. Patient without complaints, no behavior issues, Patient with q 15 minute checks and camera monitoring in progress.

## 2016-05-14 NOTE — ED Notes (Signed)
Breakfast given to Patient , and Patient coaxed to get up to eat, but he only mumbles and said " no " . Nurse will continue to monitor, q 15 minute checks and camera monitoring in progress.

## 2016-05-14 NOTE — ED Notes (Signed)
PT  IVC  PENDING  PLACEMENT/  IVC  NEED TO  BE  RENEWED  ON 05/15/16

## 2016-05-14 NOTE — ED Notes (Signed)
Pt. Alert and oriented, warm and dry, in no distress. Pt. Denies SI, HI, and AVH. Pt. Encouraged to let nursing staff know of any concerns or needs. 

## 2016-05-14 NOTE — ED Notes (Signed)

## 2016-05-14 NOTE — ED Notes (Signed)
[facility].  [facility] Morrie Sheldonshley [phone] Morrie Sheldonshley states that pt remains on the wait list for group living high placement. When CSW asked about making referrals Morrie Sheldonshley stated that the referrals must come from [facility] and not from the ED. Morrie Sheldonshley also reports that her team will be calling [facility] to see if pt's case can be moved up the list. CSW requested that Morrie Sheldonshley come to the ED to meet with this Clinical research associatewriter and the pt to discuss the pt's care. Morrie Sheldonshley agreed and will be here to meet with CSW and pt around 2:15 today.  Jonathon JordanLynn B Colen Eltzroth, MSW, Theresia MajorsLCSWA [phone]

## 2016-05-14 NOTE — ED Notes (Signed)
Patient ask for anxiety medication, states that he is feeling anxious about being here, nurse did administered po ativan 2 mg as ordered. Patient is calm and no behavioral issues noted at this time, q 15 minute checks and camera monitoring in progress.

## 2016-05-14 NOTE — ED Notes (Signed)
Patient in bathroom cleaning self up from having an incontinent episode.

## 2016-05-15 DIAGNOSIS — F250 Schizoaffective disorder, bipolar type: Secondary | ICD-10-CM | POA: Diagnosis not present

## 2016-05-15 NOTE — ED Notes (Signed)
This writer woke up patient at 0330 for him to use the restroom. Patient refused stating he was dry and did not need to use restroom.

## 2016-05-15 NOTE — ED Notes (Signed)
Pt. Alert and oriented, warm and dry, in no distress. Pt. Denies SI, HI, and AVH. Pt. Encouraged to let nursing staff know of any concerns or needs. 

## 2016-05-15 NOTE — ED Notes (Signed)

## 2016-05-15 NOTE — ED Notes (Signed)

## 2016-05-15 NOTE — ED Notes (Signed)
CSW consulted with two social work supervisors about pt's case. SW supervisors believe that Dr. Jacky KindleAronson could assist in disposition for the pt. CSW called Dr. Jacky KindleAronson and left a message. Awaiting call back.  Jonathon JordanLynn B Farha Dano, MSW, Theresia MajorsLCSWA [phone]

## 2016-05-15 NOTE — ED Notes (Signed)
Patient had incontinent episode. Patient given clean paper scrubs. Patient cleaned self up in restroom. Bed linens changed.

## 2016-05-15 NOTE — ED Notes (Signed)
Patient resting quietly in room. No noted distress or abnormal behaviors noted. Will continue 15 minute checks and observation by security camera for safety. 

## 2016-05-15 NOTE — ED Provider Notes (Signed)
-----------------------------------------   6:57 AM on 05/15/2016 -----------------------------------------   Blood pressure 128/70, pulse 78, temperature 98 F (36.7 C), temperature source Oral, resp. rate 16, height 6' (1.829 m), weight (!) 315 lb (142.9 kg), SpO2 97 %.  The patient had no acute events since last update.  Calm and cooperative at this time.  Disposition is pending per Psychiatry/Behavioral Medicine team recommendations.     Nita Sicklearolina Tikisha Molinaro, MD 05/15/16 [phone]

## 2016-05-15 NOTE — ED Notes (Signed)
Pt has taken a shower.  

## 2016-05-16 DIAGNOSIS — F250 Schizoaffective disorder, bipolar type: Secondary | ICD-10-CM | POA: Diagnosis not present

## 2016-05-16 NOTE — BH Assessment (Signed)
Confirmed with [facility]([phone]), patientremain on their WaitList.

## 2016-05-16 NOTE — ED Notes (Signed)
CSW [facility] Mor[facility] Sheldonshley [phone]. Morrie Sheldonshley states that pt remains on the waiting list for group living high placement. Morrie Sheldonshley reports that she is having a meeting with her team to discuss the treatment plan for pt. [facility]   CSW has been in contact with Wellsite geologistmedical director and SW supervisor about pt's case. CSW will await their recommendations before moving forward with pt.  Jonathon JordanLynn B Missi Mcmackin, MSW, Theresia MajorsLCSWA [phone]

## 2016-05-16 NOTE — ED Notes (Signed)
Pt does talk to unseen others

## 2016-05-16 NOTE — ED Notes (Signed)
Late entry: Per SW director and medical director, CSW requestPhotographered security check for warrant for pt. EDP was informed.   Jonathon JordanLynn B Cletis Muma, MSW, Theresia MajorsLCSWA [phone]

## 2016-05-16 NOTE — ED Notes (Signed)
IVC/  PENDING  PLACEMENT 

## 2016-05-16 NOTE — ED Provider Notes (Signed)
-----------------------------------------   6:57 AM on 05/16/2016 -----------------------------------------   Blood pressure 112/63, pulse 77, temperature 98 F (36.7 C), temperature source Oral, resp. rate 18, height 6' (1.829 m), weight (!) 315 lb (142.9 kg), SpO2 99 %.  The patient had no acute events since last update.  Calm and cooperative at this time.  [facility] and [facility] currently awaiting [facility].     Rebecka ApleyAllison P Webster, MD 05/16/16 [phone]

## 2016-05-17 DIAGNOSIS — F250 Schizoaffective disorder, bipolar type: Secondary | ICD-10-CM | POA: Diagnosis not present

## 2016-05-17 NOTE — ED Notes (Signed)
Patient went to bathroom, nurse gave him a drink and heated up his breakfast tray, Patient mumbles and laughs for no reason, Patient does not demonstrate any disruptive behaviors. Patient is safe, q 15 minute checks and camera monitoring in progress.

## 2016-05-17 NOTE — ED Notes (Signed)
Patient is calm, states that He feels better, patient is watching tv now. Patient is safe, q 15 minute checks and camera monitoring in progress.

## 2016-05-17 NOTE — BH Assessment (Signed)
Confirmed with [facility] ([phone]), patient remain on their Wait List. 

## 2016-05-17 NOTE — BH Assessment (Signed)
Requested information (Legal, vitals, MAR & Notes) [facility] and confirmed it was received Leslie Dales(Kirstyn).

## 2016-05-17 NOTE — ED Notes (Signed)
Patient states that He feels agitated and ask for ativan, nurse did administer, Patient is in dayroom now watching tv.

## 2016-05-17 NOTE — ED Notes (Signed)
Patient is talking to self and He became paranoid and delusional, and starting accusing the security guard of telling him that He is going to jail,nurse talked to him and told him that he was not going to jail and the security guard told him that He never told him that, Patient was angry and had to be redirected. Patient started yelling and acting as if He was preaching to people in the room. Patient then started to do the moon walk, nurse to monitor and to medicate as needed. q 15 minute checks and camera surveillance in progress.

## 2016-05-17 NOTE — [facility] Counselor (Signed)
[facility]

## 2016-05-17 NOTE — ED Notes (Signed)
Patient ate100% of supper .  

## 2016-05-17 NOTE — ED Notes (Addendum)
Patient is in dayroom watching tv, patient comes to nursing station often to ask for snacks, redirected patient to wait for scheduled times, nurse did give him juice between meals, Patient will dance at times in dayroom, and talk to people that are not there,  He has labile affect, no behavior issues noted at this time that cause potential problems, Patient denies si/hi or avh. q 15 minute checks and camera monitoring in progress.

## 2016-05-17 NOTE — Consult Note (Signed)
[facility]   Reason for Consult:  Updated consult note for this 31 year old man with a history of schizoaffective disorder and intellectual disability Referring Physician:  Mayford KnifeWilliams Patient Identification: Riley LamyJoseph Torres MRN:  [id number] Principal Diagnosis: Schizoaffective disorder, bipolar type [facility](HCC) Diagnosis:   Patient Active Problem List   Diagnosis Date Noted  . 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 Tachycardia [R00.0] 08/13/2015  . Tobacco use disorder [F17.200] 08/13/2015  . Schizoaffective disorder, bipolar type (HCC) [F25.0] 11/20/2012    Total Time spent with patient: 30 minutes  Subjective:   Riley LamyJoseph Carriere is a 31 y.o. male patient admitted with agitated behavior after coming out of a catatonic spell.  HPI:  31 year old man well known to us from previous encounters who has been in the emergency room for an extended stay currently. To briefly review this hospitalization he was brought here from jail in a state of withdrawn and catatonia. He was treated with benzodiazepines and a resumption of his antipsychotic medicine and over several days he came out of his catatonia and after a short period of time resumed his usual mental state which is disorganized and emotionally agitated with impaired cognition. Patient has stayed in the emergency room because of our familiarity with his disruptive agitation down on the psychiatric unit and the unlikelihood that he would benefit. He is on strong medicine being on clozapine and lithium and that it's unlikely that any acute change will alter his behavior. Although the patient has not assaulted anyone since being in the emergency room his behavior remains labile with his thoughts disorganized. He frequently is dancing and shouting and is very intrusive. He is largely the presence of security guards that helps to keep his behavior under some control. He has been only intermittently cooperative with medication and required a lot  of coaching to stay on his meds. We have been trying to arrange for some kind of disposition. His previous group home will not take him back and we have been unable to find any other placement.  Past Psychiatric History: Patient has a long history of behavior problems multiple hospitalizations multiple institutionalizations. Has been arrested multiple times. Has never lived independently. Every time he has been out on his own he has inevitably attracted the attention of the police in a short period of time. Multiple antipsychotics have been tried with a combination of mood stabilizers and clozapine currently in use.  Risk to Self: Is patient at risk for suicide?:  (Unable to assess at this time.) Risk to Others:   Prior Inpatient Therapy:   Prior Outpatient Therapy:    Past Medical History:  Past Medical History:  Diagnosis Date  . Acid reflux   . Anxiety   . Bipolar 1 disorder (HCC)   . Schizophrenia, acute (HCC)   . Seizures (HCC)     Past Surgical History:  Procedure Laterality Date  . ABDOMINAL SURGERY     Family History: No family history on file. Family Psychiatric  History: None is known of Social History:  History  Alcohol Use  . Yes     History  Drug Use No    Social History   Social History  . Marital status: Single    Spouse name: N/A  . Number of children: N/A  . Years of education: N/A   Social History Main Topics  . Smoking status: Current Every Day Smoker    Packs/day: 0.50    Types: Cigarettes  . Smokeless tobacco: None  . Alcohol use Yes  .  Drug use: No  . Sexual activity: No   Other Topics Concern  . None   Social History Narrative  . None   Additional Social History:    Allergies:   Allergies  Allergen Reactions  . Peanut-Containing Drug Products     Pt denies this allergy he has had peanut butter with no problems  . Benadryl [Diphenhydramine Hcl] Other (See Comments)    Labs: No results found for this or any previous visit (from  the past 48 hour(s)).  Current Facility-Administered Medications  Medication Dose Route Frequency Provider Last Rate Last Dose  . cloZAPine (CLOZARIL) tablet 100 mg  100 mg Oral BID Audery Amel, MD   100 mg at 05/17/16 1141  . hydrOXYzine (ATARAX/VISTARIL) tablet 50 mg  50 mg Oral TID PRN Audery Amel, MD   50 mg at 05/16/16 2053  . ibuprofen (ADVIL,MOTRIN) tablet 400 mg  400 mg Oral Q6H PRN Audery Amel, MD   400 mg at 05/15/16 2124  . lithium carbonate capsule 300 mg  300 mg Oral BID WC Audery Amel, MD   300 mg at 05/17/16 1142  . LORazepam (ATIVAN) tablet 2 mg  2 mg Oral Q6H PRN Audery Amel, MD   2 mg at 05/17/16 1329  . metoprolol tartrate (LOPRESSOR) tablet 25 mg  25 mg Oral BID Audery Amel, MD   25 mg at 05/17/16 1141   Current Outpatient Prescriptions  Medication Sig Dispense Refill  . clonazePAM (KLONOPIN) 1 MG tablet Take 1 mg by mouth at bedtime.    . divalproex (DEPAKOTE) 500 MG DR tablet Take 1,500 mg by mouth at bedtime.    . haloperidol (HALDOL) 2 MG tablet Take 2 mg by mouth 3 (three) times daily.    [name] LORazepam (ATIVAN) 1 MG tablet Take 1 mg by mouth daily as needed for anxiety.    . cloZAPine (CLOZARIL) 100 MG tablet Take 1 tablet (100 mg total) by mouth 2 (two) times daily. 60 tablet 0  . docusate sodium (COLACE) 100 MG capsule Take 2 capsules (200 mg total) by mouth 2 (two) times daily. 10 capsule 0  . fluticasone (FLONASE) 50 MCG/ACT nasal spray Place 2 sprays into both nostrils 2 (two) times daily as needed for allergies or rhinitis. 1 g 0  . lithium carbonate (LITHOBID) 300 MG CR tablet Take 2 tablets (600 mg total) by mouth every 12 (twelve) hours. 120 tablet 0  . metFORMIN (GLUCOPHAGE) 500 MG tablet Take 1 tablet (500 mg total) by mouth 2 (two) times daily with a meal. 60 tablet 0  . metoprolol tartrate (LOPRESSOR) 25 MG tablet Take 1 tablet (25 mg total) by mouth 2 (two) times daily. 30 tablet 0    Musculoskeletal: Strength & Muscle Tone: within  normal limits Gait & Station: normal Patient leans: N/A  Psychiatric Specialty Exam: Physical Exam  Nursing note and vitals reviewed. Constitutional: He appears well-developed and well-nourished.  HENT:  Head: Normocephalic and atraumatic.  Eyes: Conjunctivae are normal. Pupils are equal, round, and reactive to light.  Neck: Normal range of motion.  Cardiovascular: Regular rhythm and normal heart sounds.   Respiratory: Effort normal. No respiratory distress.  GI: Soft.  Musculoskeletal: Normal range of motion.  Neurological: He is alert.  Skin: Skin is warm and dry.  Psychiatric: His affect is labile. His speech is tangential. He is agitated and hyperactive. Thought content is paranoid. Cognition and memory are impaired. He expresses impulsivity. He expresses no homicidal and  no suicidal ideation.    Review of Systems  Constitutional: Negative.   HENT: Negative.   Eyes: Negative.   Respiratory: Negative.   Cardiovascular: Negative.   Gastrointestinal: Negative.   Musculoskeletal: Negative.   Skin: Negative.   Neurological: Negative.   Psychiatric/Behavioral: Positive for memory loss. Negative for depression, hallucinations, substance abuse and suicidal ideas. The patient is nervous/anxious. The patient does not have insomnia.     Blood pressure 123/69, pulse 78, temperature 97.6 F (36.4 C), resp. rate 18, height 6' (1.829 m), weight (!) 142.9 kg (315 lb), SpO2 100 %.Body mass index is 42.72 kg/m.  General Appearance: Casual  Eye Contact:  Fair  Speech:  Garbled  Volume:  Increased  Mood:  Angry, Depressed and Euphoric  Affect:  Labile  Thought Process:  Disorganized  Orientation:  Full (Time, Place, and Person)  Thought Content:  Illogical, Paranoid Ideation and Rumination  Suicidal Thoughts:  No  Homicidal Thoughts:  No  Memory:  Immediate;   Fair Recent;   Poor Remote;   Fair  Judgement:  Impaired  Insight:  Lacking  Psychomotor Activity:  Decreased and  Restlessness  Concentration:  Concentration: Poor  Recall:  Poor  Fund of Knowledge:  Fair  Language:  Fair  Akathisia:  No  Handed:  Right  AIMS (if indicated):     Assets:  Physical Health  ADL's:  Impaired  Cognition:  Impaired,  Mild  Sleep:        Treatment Plan Summary: Daily contact with patient to assess and evaluate symptoms and progress in treatment, Medication management and Plan 31 year old man with schizoaffective disorder and probably a mild degree of cognitive impairment chronically although we eye think have still been unable to document a definite MR diagnosis. Patient remains in the emergency room because of the lack of any other safety disposition. He was released from jail on unsecured bond and there is no indication for him to return to the penal system at this time. We have tried multiple group homes who have all refused him because of his history of agitated behavior and aggression. It has been suggested by some that we consider disposition to a homeless shelter. In my opinion this would be very irresponsible. Patient does not have the capacity to take care of himself. He has never lived independently. His behavior is always disorganized and he is consistently noncompliant with medication if it is not administered daily. Patient has a history of severe mental illness and would inevitably fall into severe and potentially life threatening problems if he is not being taken care of. Furthermore he remains agitated and aggressive in a manner that requires staff to be present with him and helping to structure his environment constantly. We are hoping that he can ultimately be transferred [facility] the state hospital. Case reviewed with the ER physician and TTS.  Disposition: Recommend psychiatric Inpatient admission when medically cleared. Supportive therapy provided about ongoing stressors.  Mordecai Rasmussen, MD 05/17/2016 5:36 PM

## 2016-05-17 NOTE — BH Assessment (Signed)
[facility]  Accepting on the [place] of [place]  Call report to [phone].  Representative was [place].  ER Staff is aware of it Bonita Quin(Linda, ER Sect.; Dr. Derrill KayGoodman, ER MD & Everardo PacificKenisha, Patient's Nurse)

## 2016-05-17 NOTE — ED Provider Notes (Signed)
Vitals:   05/16/16 1415 05/16/16 2050  BP: 122/75 (!) 144/105  Pulse: 95 (!) 107  Resp: 18   Temp: 98.3 F (36.8 C)    Patient remains medically stable for psychiatric evaluation and disposition.   Emily FilbertJonathan E Suellen Durocher, MD 05/17/16 [phone]

## 2016-05-17 NOTE — Clinical Social Work Note (Signed)
[facility] social worker called this CSW and asked for an update. She stated patient remains on their wait list. She inquired if we thought patient was at his baseline. She was going to call the patient's nurse to obtain information on his current behaviors.   CSW spoke with psychiatrist, Dr. Toni Amendlapacs, via phone and he stated that patient lacks the capacity to care for himself. CSW spoke with patient's nurse and although he can perform ADL's with no problem, he would have difficulty with taking his medications appropriately, preparing meals, affect is child like and his mood is labile.   CSW spoke with Director of CSW dept and decision was made to contact DSS to request guardianship.   York SpanielMonica Jaquawn Saffran MSW,LCSW [phone]

## 2016-05-18 NOTE — ED Notes (Signed)
Pt given clean scrubs to wear prior to discharge. VS stable. Pt cooperative. Maintained on 15 minute checks and observation by security camera for safety.

## 2016-05-18 NOTE — ED Provider Notes (Signed)
-----------------------------------------   7:10 AM on 05/18/2016 -----------------------------------------   Blood pressure 130/72, pulse 80, temperature 97.6 F (36.4 C), resp. rate 18, height 6' (1.829 m), weight (!) 315 lb (142.9 kg), SpO2 100 %.  The patient had no acute events since last update.  Calm and cooperative at this time.  Disposition is pending Psychiatry/Behavioral Medicine/CSW team recommendations.     Irean HongJade J Sung, MD 05/18/16 [phone]

## 2016-05-18 NOTE — ED Notes (Signed)
Pt IVC. [facility] [employer]. Belongings sent with officer. Pt accepting.

## 2016-05-18 NOTE — Progress Notes (Signed)
[facility] and left detailed message that [facility] and is being transported to this facility. ED RN and LCSW consulted patient has left the building and [facility].  Delta Air LinesClaudine Damond Borchers LCSW [phone]

## 2016-05-18 NOTE — ED Notes (Signed)
Report called to Heath GoldBarbra Davis, [facility].

## 2016-07-13 ENCOUNTER — Emergency Department
Admission: EM | Admit: 2016-07-13 | Discharge: 2016-07-13 | Disposition: A | Discharge status: Home / Self Care | Payer: Medicare Other | Attending: Emergency Medicine | Admitting: Emergency Medicine

## 2016-07-13 DIAGNOSIS — Z79899 Other long term (current) drug therapy: Secondary | ICD-10-CM | POA: Insufficient documentation

## 2016-07-13 DIAGNOSIS — F17210 Nicotine dependence, cigarettes, uncomplicated: Secondary | ICD-10-CM | POA: Diagnosis not present

## 2016-07-13 DIAGNOSIS — Z7984 Long term (current) use of oral hypoglycemic drugs: Secondary | ICD-10-CM | POA: Insufficient documentation

## 2016-07-13 DIAGNOSIS — R45851 Suicidal ideations: Secondary | ICD-10-CM

## 2016-07-13 DIAGNOSIS — F250 Schizoaffective disorder, bipolar type: Secondary | ICD-10-CM | POA: Diagnosis not present

## 2016-07-13 DIAGNOSIS — R443 Hallucinations, unspecified: Secondary | ICD-10-CM

## 2016-07-13 LAB — ACETAMINOPHEN LEVEL

## 2016-07-13 LAB — URINE DRUG SCREEN, QUALITATIVE (ARMC ONLY)
Amphetamines, Ur Screen: NOT DETECTED
Barbiturates, Ur Screen: NOT DETECTED
Benzodiazepine, Ur Scrn: POSITIVE — AB
COCAINE METABOLITE, UR ~~LOC~~: NOT DETECTED
Cannabinoid 50 Ng, Ur ~~LOC~~: NOT DETECTED
MDMA (ECSTASY) UR SCREEN: NOT DETECTED
METHADONE SCREEN, URINE: NOT DETECTED
Opiate, Ur Screen: NOT DETECTED
Phencyclidine (PCP) Ur S: NOT DETECTED
Tricyclic, Ur Screen: NOT DETECTED

## 2016-07-13 LAB — CBC
HEMATOCRIT: 40.1 % (ref 40.0–52.0)
HEMOGLOBIN: 12.8 g/dL — AB (ref 13.0–18.0)
MCH: 28.4 pg (ref 26.0–34.0)
MCHC: 31.9 g/dL — ABNORMAL LOW (ref 32.0–36.0)
MCV: 88.9 fL (ref 80.0–100.0)
Platelets: 230 10*3/uL (ref 150–440)
RBC: 4.51 MIL/uL (ref 4.40–5.90)
RDW: 14.1 % (ref 11.5–14.5)
WBC: 8.3 10*3/uL (ref 3.8–10.6)

## 2016-07-13 LAB — COMPREHENSIVE METABOLIC PANEL
ALBUMIN: 4.2 g/dL (ref 3.5–5.0)
ALT: 14 U/L — AB (ref 17–63)
AST: 24 U/L (ref 15–41)
Alkaline Phosphatase: 53 U/L (ref 38–126)
Anion gap: 6 (ref 5–15)
BUN: 6 mg/dL (ref 6–20)
CHLORIDE: 106 mmol/L (ref 101–111)
CO2: 28 mmol/L (ref 22–32)
CREATININE: 0.76 mg/dL (ref 0.61–1.24)
Calcium: 9.2 mg/dL (ref 8.9–10.3)
GFR calc non Af Amer: >60 mL/min (ref >60)
GLUCOSE: 86 mg/dL (ref 65–99)
Potassium: 3.5 mmol/L (ref 3.5–5.1)
SODIUM: 140 mmol/L (ref 135–145)
Total Bilirubin: 0.5 mg/dL (ref 0.3–1.2)
Total Protein: 7.4 g/dL (ref 6.5–8.1)

## 2016-07-13 LAB — ETHANOL: Alcohol, Ethyl (B): <5 mg/dL (ref <5)

## 2016-07-13 LAB — SALICYLATE LEVEL: Salicylate Lvl: <7 mg/dL (ref 2.8–30.0)

## 2016-07-13 NOTE — ED Notes (Signed)
BEHAVIORAL HEALTH ROUNDING Patient sleeping: No. Patient alert and oriented: yes Behavior appropriate: Yes.  ; If no, describe:  Nutrition and fluids offered: yes Toileting and hygiene offered: Yes  Sitter present: q15 minute observations and security  monitoring Law enforcement present: Yes  ODS  

## 2016-07-13 NOTE — Progress Notes (Signed)
LCSW called Clinton SawyerByron White and left another message for him to pick up patient.  Jayani Rozman LCSW

## 2016-07-13 NOTE — ED Notes (Signed)
Per Dr. Toni Amendlapacs pt is to be discharge.

## 2016-07-13 NOTE — ED Notes (Signed)

## 2016-07-13 NOTE — ED Triage Notes (Addendum)
Presents to the ED with a family member Ortencia KickByron, who already left before triage. Pt is voluntary. Denies SI/HI.  Patient is cooperative in triage at this time.

## 2016-07-13 NOTE — ED Notes (Signed)
PT  VOL  DOES  VERONESE  DOES  NOT  WANT  IVC  PAPERS  NOW

## 2016-07-13 NOTE — Discharge Instructions (Signed)
You have been seen in the Emergency Department (ED)  today for a psychiatric complaint.  You have been evaluated by psychiatry and we believe you are safe to be discharged from the hospital.   ° °Please return to the Emergency Department (ED)  immediately if you have ANY thoughts of hurting yourself or anyone else, so that we may help you. ° °Please avoid alcohol and drug use. ° °Follow up with your doctor and/or therapist as soon as possible regarding today's ED  visit.  ° °You may call crisis hotline for [place] at [phone]. ° °

## 2016-07-13 NOTE — Consult Note (Signed)
[facility] Consult   Reason for Consult:  Consult for 31 year old man with a history of schizoaffective disorder and developmental disability who is brought to the emergency room voluntarily today. Referring Physician:  Eual Fines Patient Identification: [name] MRN:  [id number] Principal Diagnosis: Schizoaffective disorder, bipolar type [facility]) Diagnosis:   Patient Active Problem List   Diagnosis Date Noted  . 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 Tachycardia [R00.0] 08/13/2015  . Tobacco use disorder [F17.200] 08/13/2015  . Schizoaffective disorder, bipolar type ([place]) [F25.0] 11/20/2012    Total Time spent with patient: 1 hour  Subjective:   [name] is a 31 y.o. male patient admitted with "[name] brought me here to be checked out".  HPI:  Patient interviewed. Chart reviewed. Patient known from multiple previous encounters. 31 year old man with a history of schizoaffective disorder and developmental disability with a long history of significant behavior problems as well. He was brought today to the emergency room by the owner of the group home in which she is living. History obtained from the patient. [name] tells me that he was just discharged from [facility] about a week ago and is back living with [name] the same group home owner who was working with him previously. He says they have had him attending a clubhouse or day program type program this week but today some of the other customers allegedly began "talking low" to [name]. He says they were insulting him and calling him "retarded" and telling him that he was not appropriate for the program. [name] says that his feelings were badly hurt and he got very upset but did not lash out physically. Instead he walked outdoors and ask someone to call [name] for him. Overall [name] says he's been doing pretty good especially compared to problems he's had previously. He says he is compliant with his medicine.  Denies he's had any homicidal or suicidal thoughts. Denies that he's having hallucinations. He has no physical complaints saying that he sleeping okay.  Medical history: Patient is overweight and has a history of gastric reflux and tachycardia probably relating to his clozapine but otherwise is in pretty good shape medically.  Substance abuse history: By and large [name] doesn't have a substance abuse problem. I don't think drug use is really palliate much of a part in his issues. He has been institutionalized ever since he was a child and hasn't had much opportunity to live out on the street.  Social history: As far as I know he is still his own legal guardian a situation that we have suggested in the past is not safest for him. He is back living in a group home with a group home owner who is familiar with him. According to [name], [name] is working on having the legal charges against him removed as well.  Past Psychiatric History: History of aggressive behavior in the past. History of psychotic symptoms. Has been paranoid and agitated and euphoric in the past when we have seen him. Diagnosis of schizoaffective disorder is probably correct in addition to borderline intellectual disability. He is currently on clozapine and seems to of benefited from being on the medicine. Additionally he has a past history of presenting as catatonic. The last time he was here in the emergency room he was grossly catatonic after being denied his medicine in jail for several weeks. Clearly benefits from psychiatric treatment. Does not have a history of suicide attempts  Risk to Self:   Risk to Others:   Prior Inpatient  Therapy:   Prior Outpatient Therapy:    Past Medical History:  Past Medical History:  Diagnosis Date  . Acid reflux   . Anxiety   . Bipolar 1 disorder ([place])   . Schizophrenia, acute ([place])   . Seizures ([place])     Past Surgical History:  Procedure Laterality Date  . ABDOMINAL SURGERY     Family  History: No family history on file. Family Psychiatric  History: Unknown. Dixie doesn't know much about his biological family really Social History:  History  Alcohol Use  . Yes     History  Drug Use No    Social History   Social History  . Marital status: Single    Spouse name: N/A  . Number of children: N/A  . Years of education: N/A   Social History Main Topics  . Smoking status: Current Every Day Smoker    Packs/day: 0.50    Types: Cigarettes  . Smokeless tobacco: Not on file  . Alcohol use Yes  . Drug use: No  . Sexual activity: No   Other Topics Concern  . Not on file   Social History Narrative  . No narrative on file   Additional Social History:    Allergies:   Allergies  Allergen Reactions  . Peanut-Containing Drug Products     Pt denies this allergy he has had peanut butter with no problems  . Benadryl [Diphenhydramine Hcl] Other (See Comments)    Labs:  Results for orders placed or performed during the hospital encounter of 07/13/16 (from the past 48 hour(s))  Comprehensive metabolic panel     Status: Abnormal   Collection Time: 07/13/16  1:51 PM  Result Value Ref Range   Sodium 140 135 - 145 mmol/L   Potassium 3.5 3.5 - 5.1 mmol/L   Chloride 106 101 - 111 mmol/L   CO2 28 22 - 32 mmol/L   Glucose, Bld 86 65 - 99 mg/dL   BUN 6 6 - 20 mg/dL   Creatinine, Ser 0.76 0.61 - 1.24 mg/dL   Calcium 9.2 8.9 - 10.3 mg/dL   Total Protein 7.4 6.5 - 8.1 g/dL   Albumin 4.2 3.5 - 5.0 g/dL   AST 24 15 - 41 U/L   ALT 14 (L) 17 - 63 U/L   Alkaline Phosphatase 53 38 - 126 U/L   Total Bilirubin 0.5 0.3 - 1.2 mg/dL   GFR calc non Af Amer >60 >60 mL/min   GFR calc Af Amer >60 >60 mL/min    Comment: (NOTE) The eGFR has been calculated using the CKD EPI equation. This calculation has not been validated in all clinical situations. eGFR's persistently <60 mL/min signify possible Chronic Kidney Disease.    Anion gap 6 5 - 15  [name]     Status: None    Collection Time: 07/13/16  1:51 PM  Result Value Ref Range   Alcohol, Ethyl (B) <5 <5 mg/dL    Comment:        LOWEST DETECTABLE LIMIT FOR SERUM ALCOHOL IS 5 mg/dL FOR MEDICAL PURPOSES ONLY   Salicylate level     Status: None   Collection Time: 07/13/16  1:51 PM  Result Value Ref Range   Salicylate Lvl <7.3 2.8 - 30.0 mg/dL  Acetaminophen level     Status: Abnormal   Collection Time: 07/13/16  1:51 PM  Result Value Ref Range   Acetaminophen (Tylenol), Serum <10 (L) 10 - 30 ug/mL    Comment:  THERAPEUTIC CONCENTRATIONS VARY SIGNIFICANTLY. A RANGE OF 10-30 ug/mL MAY BE AN EFFECTIVE CONCENTRATION FOR MANY PATIENTS. HOWEVER, SOME ARE BEST TREATED AT CONCENTRATIONS OUTSIDE THIS RANGE. ACETAMINOPHEN CONCENTRATIONS >150 ug/mL AT 4 HOURS AFTER INGESTION AND >50 ug/mL AT 12 HOURS AFTER INGESTION ARE OFTEN ASSOCIATED WITH TOXIC REACTIONS.   cbc     Status: Abnormal   Collection Time: 07/13/16  1:51 PM  Result Value Ref Range   WBC 8.3 3.8 - 10.6 K/uL   RBC 4.51 4.40 - 5.90 MIL/uL   Hemoglobin 12.8 (L) 13.0 - 18.0 g/dL   HCT 40.1 40.0 - 52.0 %   MCV 88.9 80.0 - 100.0 fL   MCH 28.4 26.0 - 34.0 pg   MCHC 31.9 (L) 32.0 - 36.0 g/dL   RDW 14.1 11.5 - 14.5 %   Platelets 230 150 - 440 K/uL  Urine Drug Screen, Qualitative     Status: Abnormal   Collection Time: 07/13/16  1:51 PM  Result Value Ref Range   Tricyclic, Ur Screen NONE DETECTED NONE DETECTED   Amphetamines, Ur Screen NONE DETECTED NONE DETECTED   MDMA (Ecstasy)Ur Screen NONE DETECTED NONE DETECTED   Cocaine Metabolite,Ur [place] NONE DETECTED NONE DETECTED   Opiate, Ur Screen NONE DETECTED NONE DETECTED   Phencyclidine (PCP) Ur S NONE DETECTED NONE DETECTED   Cannabinoid 50 Ng, Ur [place] NONE DETECTED NONE DETECTED   Barbiturates, Ur Screen NONE DETECTED NONE DETECTED   Benzodiazepine, Ur Scrn POSITIVE (A) NONE DETECTED   Methadone Scn, Ur NONE DETECTED NONE DETECTED    Comment: (NOTE) 250  Tricyclics, urine                Cutoff 1000 ng/mL 200  Amphetamines, urine             Cutoff 1000 ng/mL 300  MDMA (Ecstasy), urine           Cutoff 500 ng/mL 400  Cocaine Metabolite, urine       Cutoff 300 ng/mL 500  Opiate, urine                   Cutoff 300 ng/mL 600  Phencyclidine (PCP), urine      Cutoff 25 ng/mL 700  Cannabinoid, urine              Cutoff 50 ng/mL 800  Barbiturates, urine             Cutoff 200 ng/mL 900  Benzodiazepine, urine           Cutoff 200 ng/mL 1000 Methadone, urine                Cutoff 300 ng/mL 1100 1200 The urine drug screen provides only a preliminary, unconfirmed 1300 analytical test result and should not be used for non-medical 1400 purposes. Clinical consideration and professional judgment should 1500 be applied to any positive drug screen result due to possible 1600 interfering substances. A more specific alternate chemical method 1700 must be used in order to obtain a confirmed analytical result.  1800 Gas chromato graphy / mass spectrometry (GC/MS) is the preferred 1900 confirmatory method.     No current facility-administered medications for this encounter.    Current Outpatient Prescriptions  Medication Sig Dispense Refill  . clonazePAM (KLONOPIN) 1 MG tablet Take 1 mg by mouth at bedtime.    . cloZAPine (CLOZARIL) 100 MG tablet Take 1 tablet (100 mg total) by mouth 2 (two) times daily. 60 tablet 0  . divalproex (DEPAKOTE) 500 MG  DR tablet Take 1,500 mg by mouth at bedtime.    . docusate sodium (COLACE) 100 MG capsule Take 2 capsules (200 mg total) by mouth 2 (two) times daily. 10 capsule 0  . fluticasone (FLONASE) 50 MCG/ACT nasal spray Place 2 sprays into both nostrils 2 (two) times daily as needed for allergies or rhinitis. 1 g 0  . haloperidol (HALDOL) 2 MG tablet Take 2 mg by mouth 3 (three) times daily.    [name] lithium carbonate (LITHOBID) 300 MG CR tablet Take 2 tablets (600 mg total) by mouth every 12 (twelve) hours. 120 tablet 0  . LORazepam (ATIVAN) 1 MG tablet  Take 1 mg by mouth daily as needed for anxiety.    . metFORMIN (GLUCOPHAGE) 500 MG tablet Take 1 tablet (500 mg total) by mouth 2 (two) times daily with a meal. 60 tablet 0  . metoprolol tartrate (LOPRESSOR) 25 MG tablet Take 1 tablet (25 mg total) by mouth 2 (two) times daily. 30 tablet 0    Musculoskeletal: Strength & Muscle Tone: within normal limits Gait & Station: normal Patient leans: N/A  Psychiatric Specialty Exam: Physical Exam  Nursing note and vitals reviewed. Constitutional: He appears well-developed and well-nourished.  HENT:  Head: Normocephalic and atraumatic.  Eyes: Conjunctivae are normal. Pupils are equal, round, and reactive to light.  Neck: Normal range of motion.  Cardiovascular: Regular rhythm and normal heart sounds.   Respiratory: Effort normal. No respiratory distress.  GI: Soft.  Musculoskeletal: Normal range of motion.  Neurological: He is alert.  Skin: Skin is warm and dry.  Psychiatric: He has a normal mood and affect. His behavior is normal. Judgment normal. His speech is tangential. Thought content is not paranoid. Cognition and memory are normal. He expresses no homicidal and no suicidal ideation.    Review of Systems  Constitutional: Negative.   HENT: Negative.   Eyes: Negative.   Respiratory: Negative.   Cardiovascular: Negative.   Gastrointestinal: Negative.   Musculoskeletal: Negative.   Skin: Negative.   Neurological: Negative.   Psychiatric/Behavioral: Negative for depression, hallucinations, memory loss, substance abuse and suicidal ideas. The patient is not nervous/anxious and does not have insomnia.     Pulse 60, temperature 98.4 F (36.9 C), temperature source Oral, resp. rate 20, height 6' 6"  (1.981 m), weight (!) 142.4 kg (314 lb), SpO2 100 %.Body mass index is 36.29 kg/m.  General Appearance: Fairly Groomed  Eye Contact:  Good  Speech:  Clear and Coherent  Volume:  Normal  Mood:  Dysphoric  Affect:  Appropriate  Thought  Process:  Goal Directed  Orientation:  Full (Time, Place, and Person)  Thought Content:  Logical  Suicidal Thoughts:  No  Homicidal Thoughts:  No  Memory:  Immediate;   Fair Recent;   Fair Remote;   Fair  Judgement:  Fair  Insight:  Fair  Psychomotor Activity:  Normal  Concentration:  Concentration: Fair  Recall:  [employer] of Knowledge:  Fair  Language:  Fair  Akathisia:  No  Handed:  Right  AIMS (if indicated):     Assets:  Communication Skills Desire for Improvement Financial Resources/Insurance Housing Physical Health Resilience  ADL's:  Intact  Cognition:  Impaired,  Mild  Sleep:        Treatment Plan Summary: Plan 31 year old man with schizoaffective disorder. Fortunately he actually seems to be at his baseline or in fact even better then he often is when we have seen him in the past. I suspect the 2  months at [facility] did aims good. I complemented his choice of mature behavior when stressed out today. [name] does not currently require inpatient psychiatric hospitalization. Social work contacted the group home owner who is agreeable to coming in taking him back. Patient follows up with [employer] act team. Case reviewed with emergency room physician and TTS.  Disposition: Patient does not meet criteria for psychiatric inpatient admission. Supportive therapy provided about ongoing stressors.  Alethia Berthold, MD 07/13/2016 3:28 PM

## 2016-07-13 NOTE — ED Provider Notes (Signed)
[facility] Emergency Department Provider Note  ____________________________________________  Time seen: Approximately 3:19 PM  I have reviewed the triage vital signs and the nursing notes.   HISTORY  Chief Complaint Hallucinations   HPI [name] is a 31 y.o. male with a history of bipolar and schizophrenia who presents from his group home for aggressive behavior and hallucinations. Patient tells me that he does not like his day program. He is bullied by some of the other members of the program. He says that he's been hearing voices telling him to kill himself and others in the day program. He endorses suicidal ideation with no plan. He reports prior suicidal ideation however never attempted. He also reports that he sees the devil. He reports that auditory and visual hallucinations have been present since he was 31 years old. He denies drug use or alcohol use.  Past Medical History:  Diagnosis Date  . Acid reflux   . Anxiety   . Bipolar 1 disorder ([facility])   . Schizophrenia, acute ([facility])   . Seizures [facility])     Patient Active Problem List   Diagnosis Date Noted  . 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 Tachycardia 08/13/2015  . Tobacco use disorder 08/13/2015  . Schizoaffective disorder, bipolar type ([facility]) 11/20/2012    Past Surgical History:  Procedure Laterality Date  . ABDOMINAL SURGERY      Prior to Admission medications   Medication Sig Start Date End Date Taking? Authorizing Provider  clonazePAM (KLONOPIN) 1 MG tablet Take 1 mg by mouth at bedtime.    Historical Provider, MD  cloZAPine (CLOZARIL) 100 MG tablet Take 1 tablet (100 mg total) by mouth 2 (two) times daily. 08/13/15   Jimmy FootmanAndrea Hernandez-Gonzalez, MD  divalproex (DEPAKOTE) 500 MG DR tablet Take 1,500 mg by mouth at bedtime.    Historical Provider, MD  docusate sodium (COLACE) 100 MG capsule Take 2 capsules (200 mg total) by mouth 2 (two) times daily. 08/13/15   Jimmy FootmanAndrea Hernandez-Gonzalez, MD    fluticasone (FLONASE) 50 MCG/ACT nasal spray Place 2 sprays into both nostrils 2 (two) times daily as needed for allergies or rhinitis. 08/13/15   Jimmy FootmanAndrea Hernandez-Gonzalez, MD  haloperidol (HALDOL) 2 MG tablet Take 2 mg by mouth 3 (three) times daily.    Historical Provider, MD  lithium carbonate (LITHOBID) 300 MG CR tablet Take 2 tablets (600 mg total) by mouth every 12 (twelve) hours. 08/13/15   Jimmy FootmanAndrea Hernandez-Gonzalez, MD  LORazepam (ATIVAN) 1 MG tablet Take 1 mg by mouth daily as needed for anxiety.    Historical Provider, MD  metFORMIN (GLUCOPHAGE) 500 MG tablet Take 1 tablet (500 mg total) by mouth 2 (two) times daily with a meal. 08/13/15   Jimmy FootmanAndrea Hernandez-Gonzalez, MD  metoprolol tartrate (LOPRESSOR) 25 MG tablet Take 1 tablet (25 mg total) by mouth 2 (two) times daily. 08/13/15   Jimmy FootmanAndrea Hernandez-Gonzalez, MD    Allergies Peanut-containing drug products and Benadryl [diphenhydramine hcl]  No family history on file.  Social History Social History  Substance Use Topics  . Smoking status: Current Every Day Smoker    Packs/day: 0.50    Types: Cigarettes  . Smokeless tobacco: Not on file  . Alcohol use Yes    Review of Systems  Constitutional: Negative for fever. Eyes: Negative for visual changes. ENT: Negative for sore throat. Neck: No neck pain  Cardiovascular: Negative for chest pain. Respiratory: Negative for shortness of breath. Gastrointestinal: Negative for abdominal pain, vomiting or diarrhea. Genitourinary: Negative for dysuria. Musculoskeletal: Negative  for back pain. Skin: Negative for rash. Neurological: Negative for headaches, weakness or numbness. Psych: + SI, HI, VH and AH ____________________________________________   PHYSICAL EXAM:  VITAL SIGNS: ED Triage Vitals  Enc Vitals Group     BP --      Pulse Rate 07/13/16 1331 60     Resp 07/13/16 1331 20     Temp 07/13/16 1331 98.4 F (36.9 C)     Temp Source 07/13/16 1331 Oral     SpO2 07/13/16 1331  100 %     Weight 07/13/16 1332 (!) 314 lb (142.4 kg)     Height 07/13/16 1332 6\' 6"  (1.981 m)     Head Circumference --      Peak Flow --      Pain Score 07/13/16 1344 0     Pain Loc --      Pain Edu? --      Excl. in GC? --     Constitutional: Alert and oriented. Well appearing and in no apparent distress. HEENT:      Head: Normocephalic and atraumatic.         Eyes: Conjunctivae are normal. Sclera is non-icteric. EOMI. PERRL      Mouth/Throat: Mucous membranes are moist.       Neck: Supple with no signs of meningismus. Cardiovascular: Regular rate and rhythm. No murmurs, gallops, or rubs. 2+ symmetrical distal pulses are present in all extremities. No JVD. Respiratory: Normal respiratory effort. Lungs are clear to auscultation bilaterally. No wheezes, crackles, or rhonchi.  Gastrointestinal: Soft, non tender, and non distended with positive bowel sounds. No rebound or guarding. Genitourinary: No CVA tenderness. Musculoskeletal: Nontender with normal range of motion in all extremities. No edema, cyanosis, or erythema of extremities. Neurologic: Normal speech and language. Face is symmetric. Moving all extremities. No gross focal neurologic deficits are appreciated. Skin: Skin is warm, dry and intact. No rash noted. Psychiatric: Mood and affect are normal. Speech and behavior are normal. Auditory and visual hallucinations, passive SI and HI.  ____________________________________________   LABS (all labs ordered are listed, but only abnormal results are displayed)  Labs Reviewed  COMPREHENSIVE METABOLIC PANEL - Abnormal; Notable for the following:       Result Value   ALT 14 (*)    All other components within normal limits  ACETAMINOPHEN LEVEL - Abnormal; Notable for the following:    Acetaminophen (Tylenol), Serum <10 (*)    All other components within normal limits  CBC - Abnormal; Notable for the following:    Hemoglobin 12.8 (*)    MCHC 31.9 (*)    All other components  within normal limits  URINE DRUG SCREEN, QUALITATIVE ([facility] ONLY) - Abnormal; Notable for the following:    Benzodiazepine, Ur Scrn POSITIVE (*)    All other components within normal limits  [name]  SALICYLATE LEVEL   ____________________________________________  EKG  none  ____________________________________________  RADIOLOGY  none  ____________________________________________   PROCEDURES  Procedure(s) performed: None Procedures Critical Care performed:  None ____________________________________________   INITIAL IMPRESSION / ASSESSMENT AND PLAN / ED COURSE  31 y.o. male with a history of bipolar and schizophrenia who presents from his group home for aggressive behavior and auditory and visual hallucinations, passive SI and HI. We'll get IVC paperwork. We'll get blood for medical clearance. We'll have Dr. Toni Amend to evaluate patient in emergency department.  Clinical Course as of Jul 13 1521  Fri Jul 13, 2016  1518 Patient has been evaluated by Dr. Mat Carne  packs and cleared for discharge. Blood work with no acute findings. Patient will be sent back to his group home.  [CV]    Clinical Course User Index [CV] Nita Sickle, MD    Pertinent labs & imaging results that were available during my care of the patient were reviewed by me and considered in my medical decision making (see chart for details).    ____________________________________________   FINAL CLINICAL IMPRESSION(S) / ED DIAGNOSES  Final diagnoses:  Hallucinations  Suicidal ideation      NEW MEDICATIONS STARTED DURING THIS VISIT:  New Prescriptions   No medications on file     Note:  This document was prepared using Dragon voice recognition software and may include unintentional dictation errors.    Nita Sickle, MD 07/13/16 [phone]

## 2016-07-13 NOTE — Progress Notes (Signed)
LCSW called [name], Patient has been connected to [employer] Act team and has peer support coming this week to help with patients additional needs. Jomarie LongsJoseph attends [facility] [employer] and according to his Group home provider does well on a half day basis. Lately patient has become enamored with a girl who lives in [place] and patient has made comments he would like to be there for support.  Group home provider Ortencia KickByron  [phone] he will come and pick up patient at 4pm.  Consulted with ED Psychiatrist.  Arrie Senatelaudine Brodi Kari LCSW [phone]

## 2016-07-13 NOTE — ED Notes (Signed)
The patient was given a meal tray. He was also given milk to drink.

## 2016-07-23 ENCOUNTER — Encounter: Payer: Self-pay | Admitting: Emergency Medicine

## 2016-07-23 ENCOUNTER — Emergency Department

## 2016-07-23 ENCOUNTER — Emergency Department
Admission: EM | Admit: 2016-07-23 | Discharge: 2016-07-23 | Disposition: A | Discharge status: Home / Self Care | Attending: Emergency Medicine | Admitting: Emergency Medicine

## 2016-07-23 DIAGNOSIS — R531 Weakness: Secondary | ICD-10-CM | POA: Insufficient documentation

## 2016-07-23 DIAGNOSIS — R930 Abnormal findings on diagnostic imaging of skull and head, not elsewhere classified: Secondary | ICD-10-CM | POA: Insufficient documentation

## 2016-07-23 DIAGNOSIS — Z79899 Other long term (current) drug therapy: Secondary | ICD-10-CM | POA: Diagnosis not present

## 2016-07-23 DIAGNOSIS — Z7984 Long term (current) use of oral hypoglycemic drugs: Secondary | ICD-10-CM | POA: Insufficient documentation

## 2016-07-23 DIAGNOSIS — M899 Disorder of bone, unspecified: Secondary | ICD-10-CM

## 2016-07-23 DIAGNOSIS — F17210 Nicotine dependence, cigarettes, uncomplicated: Secondary | ICD-10-CM | POA: Insufficient documentation

## 2016-07-23 DIAGNOSIS — R4182 Altered mental status, unspecified: Secondary | ICD-10-CM | POA: Diagnosis present

## 2016-07-23 DIAGNOSIS — Z91010 Allergy to peanuts: Secondary | ICD-10-CM | POA: Diagnosis not present

## 2016-07-23 LAB — CBC WITH DIFFERENTIAL/PLATELET
BASOS ABS: 0.1 10*3/uL (ref 0–0.1)
Basophils Relative: 0 %
EOS ABS: 0.2 10*3/uL (ref 0–0.7)
EOS PCT: 1 %
HCT: 38.6 % — ABNORMAL LOW (ref 40.0–52.0)
Hemoglobin: 12.6 g/dL — ABNORMAL LOW (ref 13.0–18.0)
LYMPHS ABS: 1.4 10*3/uL (ref 1.0–3.6)
LYMPHS PCT: 10 %
MCH: 28.5 pg (ref 26.0–34.0)
MCHC: 32.6 g/dL (ref 32.0–36.0)
MCV: 87.5 fL (ref 80.0–100.0)
MONO ABS: 1.9 10*3/uL — AB (ref 0.2–1.0)
Monocytes Relative: 13 %
NEUTROS PCT: 76 %
Neutro Abs: 11.5 10*3/uL — ABNORMAL HIGH (ref 1.4–6.5)
PLATELETS: 244 10*3/uL (ref 150–440)
RBC: 4.42 MIL/uL (ref 4.40–5.90)
RDW: 13.9 % (ref 11.5–14.5)
WBC: 15.1 10*3/uL — ABNORMAL HIGH (ref 3.8–10.6)

## 2016-07-23 LAB — COMPREHENSIVE METABOLIC PANEL
ALT: 46 U/L (ref 17–63)
AST: 75 U/L — AB (ref 15–41)
Albumin: 3.7 g/dL (ref 3.5–5.0)
Alkaline Phosphatase: 49 U/L (ref 38–126)
Anion gap: 10 (ref 5–15)
BUN: 11 mg/dL (ref 6–20)
CHLORIDE: 102 mmol/L (ref 101–111)
CO2: 26 mmol/L (ref 22–32)
CREATININE: 0.7 mg/dL (ref 0.61–1.24)
Calcium: 9.3 mg/dL (ref 8.9–10.3)
GFR calc non Af Amer: >60 mL/min (ref >60)
Glucose, Bld: 95 mg/dL (ref 65–99)
POTASSIUM: 3.6 mmol/L (ref 3.5–5.1)
SODIUM: 138 mmol/L (ref 135–145)
Total Bilirubin: 1.6 mg/dL — ABNORMAL HIGH (ref 0.3–1.2)
Total Protein: 7.3 g/dL (ref 6.5–8.1)

## 2016-07-23 LAB — GLUCOSE, CAPILLARY: Glucose-Capillary: 94 mg/dL (ref 65–99)

## 2016-07-23 LAB — TROPONIN I: TROPONIN I: 0.03 ng/mL — AB (ref <0.03)

## 2016-07-23 LAB — ETHANOL: Alcohol, Ethyl (B): <5 mg/dL (ref <5)

## 2016-07-23 MED ORDER — BENZTROPINE MESYLATE 1 MG/ML IJ SOLN
1.0000 mg | Freq: Once | INTRAMUSCULAR | Status: AC
  Administered 2016-07-23: 1 mg via INTRAVENOUS
  Filled 2016-07-23: qty 1

## 2016-07-23 NOTE — ED Triage Notes (Signed)
Pt [facility] ems from jail; ems reports that pt has been increasingly lethargic since being jailed on 3/25. The cellmate reported that pt has been vomiting. Pt is drooling constantly upon arrival, asking for food. NAD noted. t accompanied by law enforcement, with leg shackles on.

## 2016-07-23 NOTE — ED Notes (Signed)
Report given to [facility] RN at the jail.

## 2016-07-23 NOTE — ED Notes (Signed)
Helped pt to bathroom. 

## 2016-07-23 NOTE — ED Notes (Signed)
Pt unhooked from cardiac monitor and assisted to toilet. Officer in rm with pt.

## 2016-07-23 NOTE — ED Notes (Addendum)
Patient ambulated to room commode independently with officer in the room. Patient was assisted to clean self after BM. Patient did not give urine specimen. Urine and stool noted in the commode.

## 2016-07-23 NOTE — ED Notes (Signed)
Patient left with two deputies via wheelchair.

## 2016-07-23 NOTE — ED Notes (Signed)
Gave pt a food tray and apple juice. Hooked pt back up to monitor.

## 2016-07-23 NOTE — ED Provider Notes (Signed)
[facility] Emergency Department Provider Note       Time seen: ----------------------------------------- 9:23 AM on 07/23/2016 -----------------------------------------     I have reviewed the triage vital signs and the nursing notes.   HISTORY   Chief Complaint Altered Mental Status    HPI [name] is a 31 y.o. male who presents to the ED for altered mental status. Patient reports from jail where he was noted to be more lethargic than normal and he was drooling. Patient denies any specific complaints, has had some vomiting which she states was from bad food that he ate. Symptom onset was several days ago, nothing seems to make it better or worse.   Past Medical History:  Diagnosis Date  . Acid reflux   . Anxiety   . Bipolar 1 disorder (HCC)   . Schizophrenia, acute (HCC)   . Seizures [facility])     Patient Active Problem List   Diagnosis Date Noted  . 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 Tachycardia 08/13/2015  . Tobacco use disorder 08/13/2015  . Schizoaffective disorder, bipolar type (HCC) 11/20/2012    Past Surgical History:  Procedure Laterality Date  . ABDOMINAL SURGERY      Allergies Peanut-containing drug products and Benadryl [diphenhydramine hcl]  Social History Social History  Substance Use Topics  . Smoking status: Current Every Day Smoker    Packs/day: 0.50    Types: Cigarettes  . Smokeless tobacco: Not on file  . Alcohol use Yes    Review of Systems Constitutional: Negative for fever. ENT: Positive for drooling Cardiovascular: Negative for chest pain. Respiratory: Negative for shortness of breath. Gastrointestinal: Negative for abdominal pain, Positive for vomiting Genitourinary: Negative for dysuria. Musculoskeletal: Negative for back pain. Skin: Negative for rash. Neurological: Negative for headaches, positive for weakness  10-point ROS otherwise negative.  ____________________________________________   PHYSICAL  EXAM:  VITAL SIGNS: ED Triage Vitals  Enc Vitals Group     BP      Pulse      Resp      Temp      Temp src      SpO2      Weight      Height      Head Circumference      Peak Flow      Pain Score      Pain Loc      Pain Edu?      Excl. in GC?     Constitutional: Alert and oriented. Well appearing and in no distress. Eyes: Conjunctivae are normal. PERRL. Normal extraocular movements. ENT   Head: Normocephalic and atraumatic.   Nose: No congestion/rhinnorhea.   Mouth/Throat: Mucous membranes are moist.Mild drooling is noted   Neck: No stridor. Cardiovascular: Normal rate, regular rhythm. No murmurs, rubs, or gallops. Respiratory: Normal respiratory effort without tachypnea nor retractions. Breath sounds are clear and equal bilaterally. No wheezes/rales/rhonchi. Gastrointestinal: Soft and nontender. Normal bowel sounds Musculoskeletal: Nontender with normal range of motion in extremities. No lower extremity tenderness nor edema. Neurologic:  Normal speech and language. No gross focal neurologic deficits are appreciated. Generalized weakness, nothing focal Skin:  Skin is warm, dry and intact. No rash noted. Psychiatric: Mood and affect are normal. Speech and behavior are normal.  ____________________________________________  EKG: Interpreted by me.Sinus rhythm rate of 62 bpm, normal PR interval, normal QRS, normal QT, normal axis. Possible septal infarct age indeterminate EKG is unchanged from 2014 ____________________________________________  ED COURSE:  Pertinent labs & imaging results that were available  during my care of the patient were reviewed by me and considered in my medical decision making (see chart for details). Patient presents for weakness, we will assess with labs and imaging as indicated.   Procedures ____________________________________________   LABS (pertinent positives/negatives)  Labs Reviewed  CBC WITH DIFFERENTIAL/PLATELET -  Abnormal; Notable for the following:       Result Value   WBC 15.1 (*)    Hemoglobin 12.6 (*)    HCT 38.6 (*)    Neutro Abs 11.5 (*)    Monocytes Absolute 1.9 (*)    All other components within normal limits  COMPREHENSIVE METABOLIC PANEL - Abnormal; Notable for the following:    AST 75 (*)    Total Bilirubin 1.6 (*)    All other components within normal limits  TROPONIN I - Abnormal; Notable for the following:    Troponin I 0.03 (*)    All other components within normal limits  [name]  GLUCOSE, CAPILLARY  URINALYSIS, COMPLETE (UACMP) WITH MICROSCOPIC  URINE DRUG SCREEN, QUALITATIVE ([facility] ONLY)  CBG MONITORING, ED    RADIOLOGY Images were viewed by me  IMPRESSION: No active cardiopulmonary disease. Mild lower thoracic dextroscoliosis. IMPRESSION: No acute intracranial abnormality. Nonspecific sclerotic calvarial lesions are noted the largest in right frontal skull 1 cm. No evidence of cortical destruction or bone destruction. Multifocal fibrous dysplasia cannot be excluded. Metastatic disease cannot be entirely excluded. Clinical correlation is necessary minimal mucosal thickening posterior aspect of the right maxillary sinus and lateral wall of the left maxillary sinus.  These results were called by telephone at the time of interpretation on 07/23/2016 at 10:10 am to Dr. Daryel November , who verbally acknowledged these results. ____________________________________________  FINAL ASSESSMENT AND PLAN  Weakness, sclerotic calvarial lesion  Plan: Patient's labs and imaging were dictated above. Patient had presented for Weakness and his main concern was that the food in jail was not safe to eat. He has eaten well here. Indeterminate finding for his sclerotic lesion on CT scan. Neurology feels outpatient follow-up is warranted only. He has a white count but no source. He is stable for outpatient follow-up at this time.   Emily Filbert, MD   Note: This note  was generated in part or whole with voice recognition software. Voice recognition is usually quite accurate but there are transcription errors that can and very often do occur. I apologize for any typographical errors that were not detected and corrected.     Emily Filbert, MD 07/23/16 [phone]

## 2016-07-23 NOTE — ED Notes (Signed)
Patient states he needed to go to the bathroom, but could not give urine sample. Patient ate potato chips and banana.

## 2018-03-27 IMAGING — CR DG CHEST 2V
1 series · 2 of 2 positions shown · non-contrast
Comparison: None.

CLINICAL DATA: Weakness, slurred speech

EXAM:
CHEST  2 VIEW

[Series 1: dg chest 2 view · 0.14mm/px · 2 of 2 slices shown]
[im 1/2]
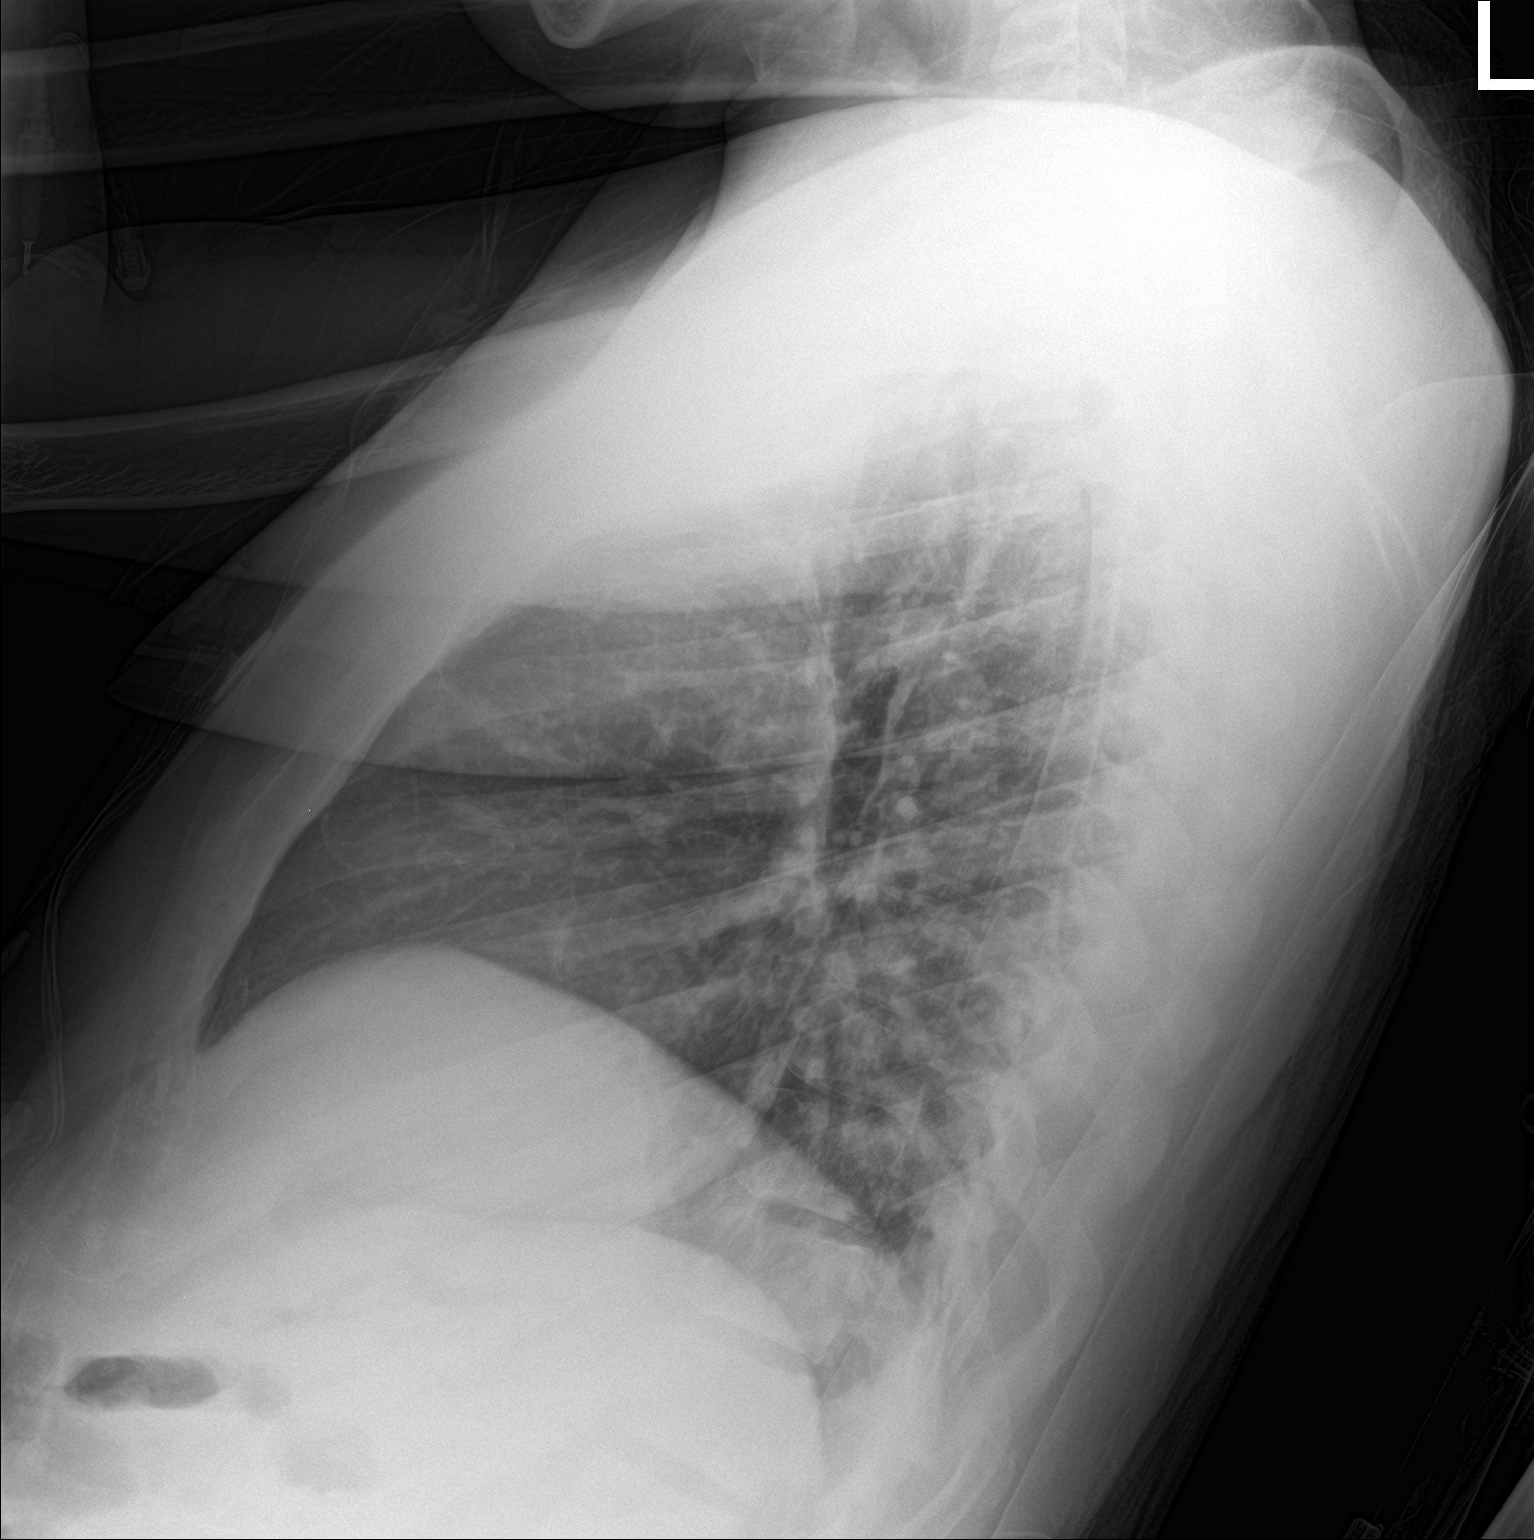
[im 2/2]
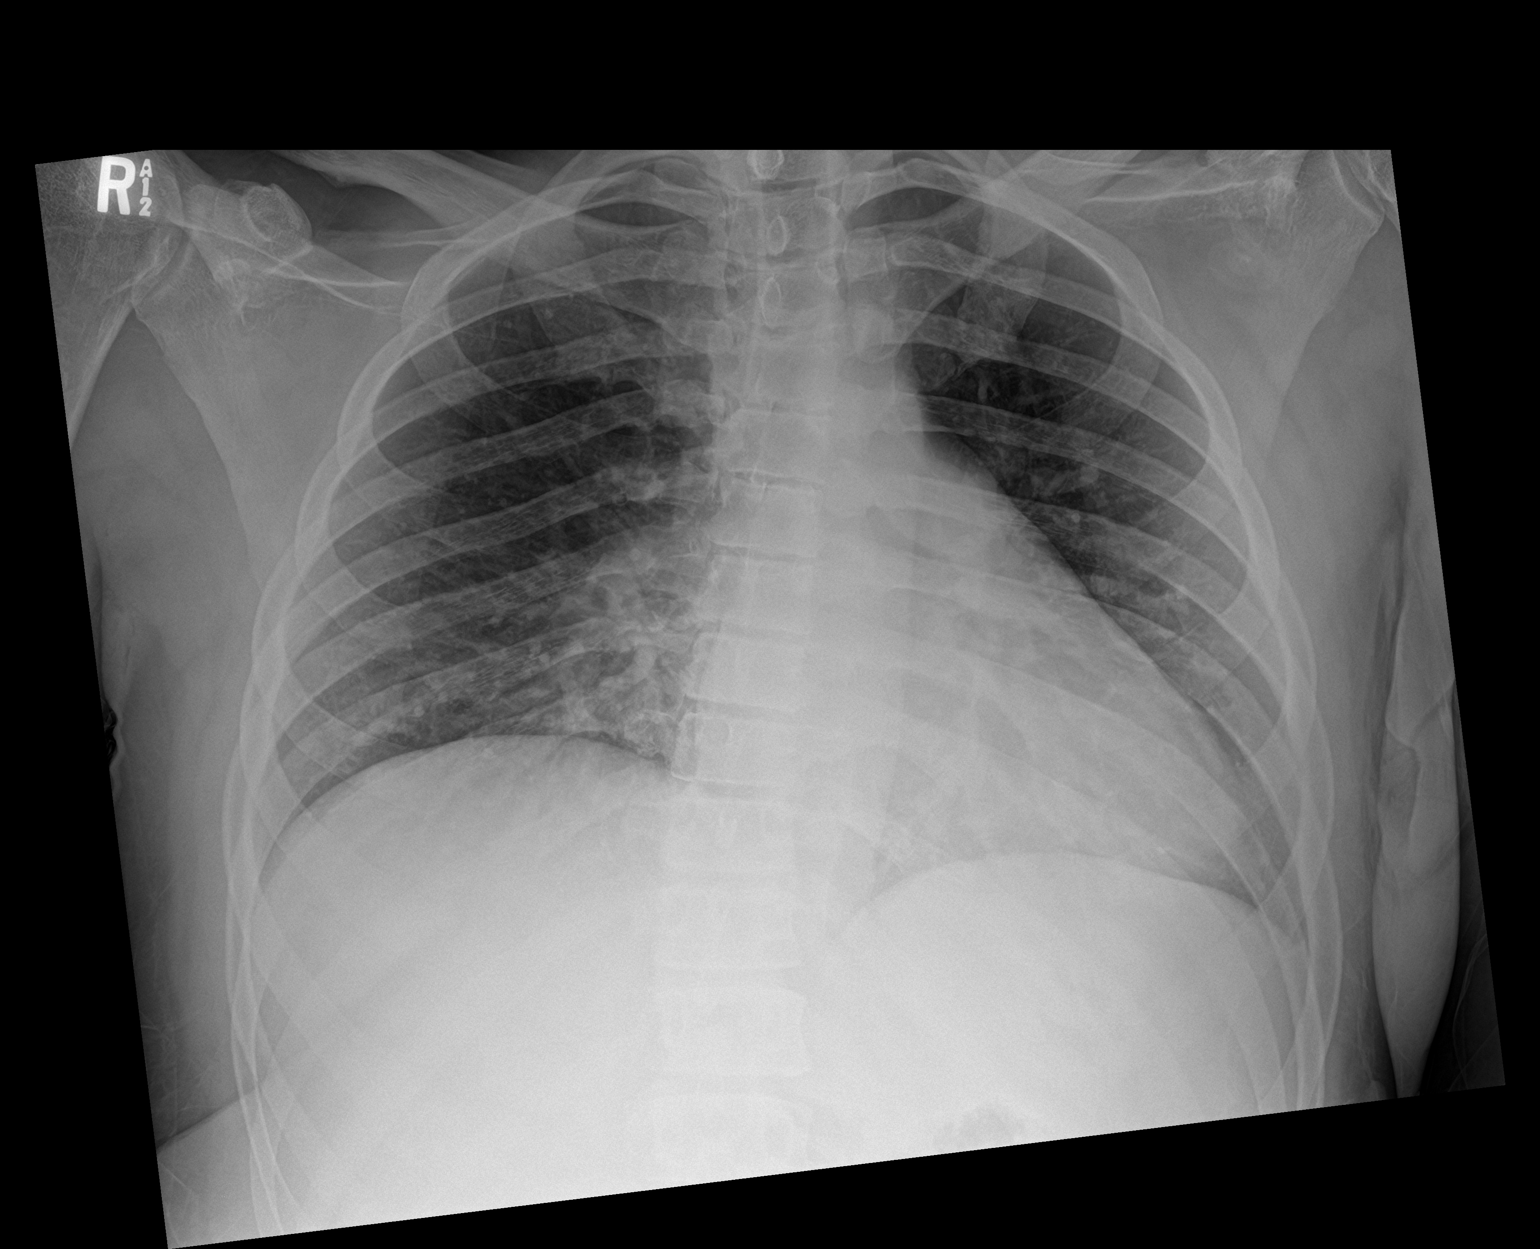

[2 of 2 positions shown; findings below may reference images not displayed]

FINDINGS: Borderline cardiomegaly. No infiltrate or pleural effusion. No
pulmonary edema. Mild lower thoracic dextroscoliosis.
IMPRESSION: No active cardiopulmonary disease. Mild lower thoracic
dextroscoliosis.

## 2018-03-27 IMAGING — CT CT HEAD W/O CM
3 series · 15 of 47 positions shown, 18 images · non-contrast
Comparison: None.

CLINICAL DATA: Altered mental status

EXAM:
CT HEAD WITHOUT CONTRAST
TECHNIQUE: Contiguous axial images were obtained from the base of the skull
through the vertex without intravenous contrast.

[Series 2: head wo · axial · 0.47mm/px · z∈[+118,+253]mm · 9 of 33 slices shown, 12 images]
[im 3/33  brain]
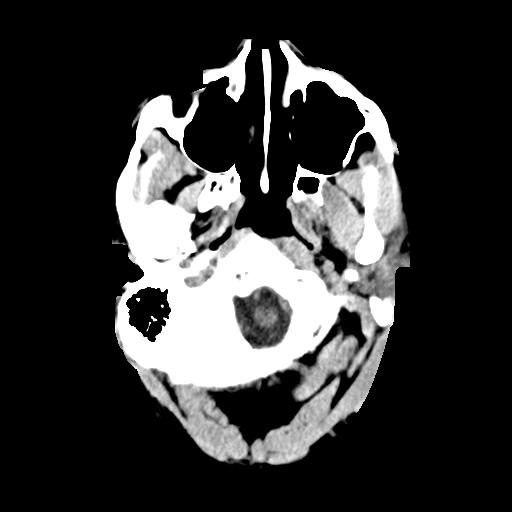
[im 3/33  bone]
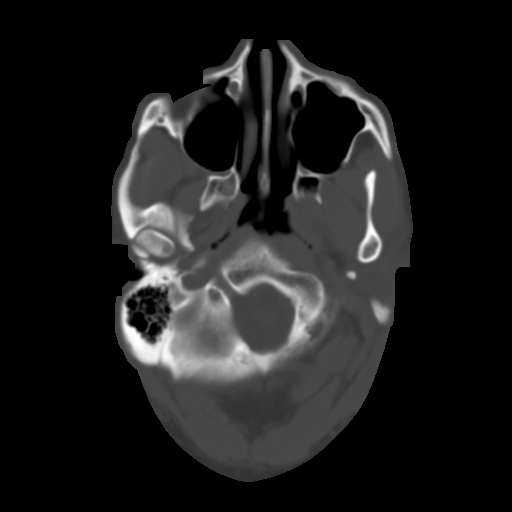
[im 6/33  brain]
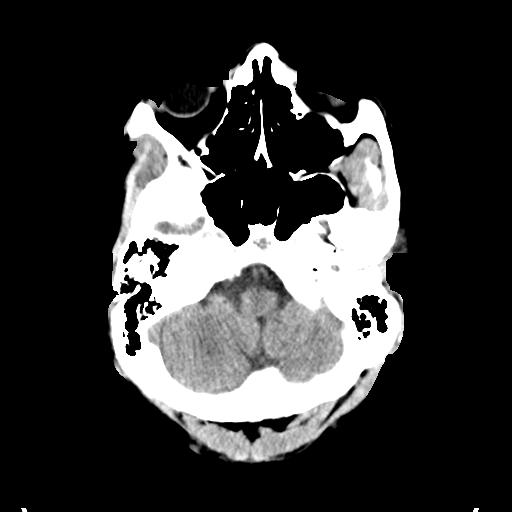
[im 9/33  brain]
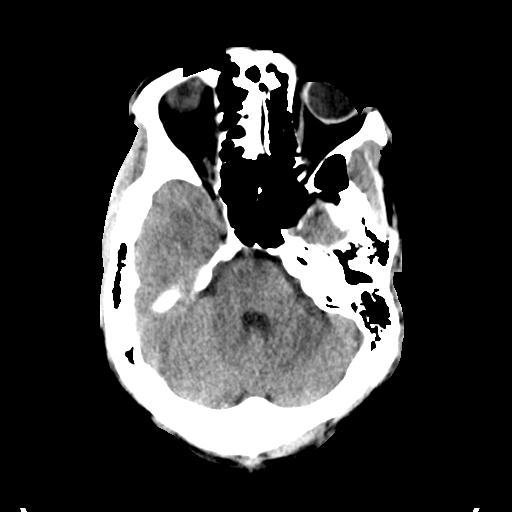
[im 13/33  brain]
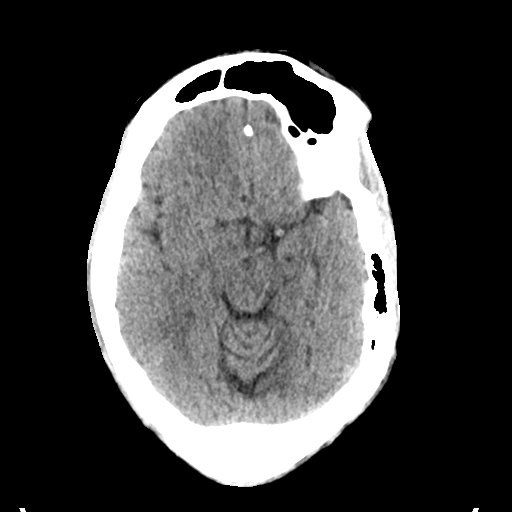
[im 17/33  brain]
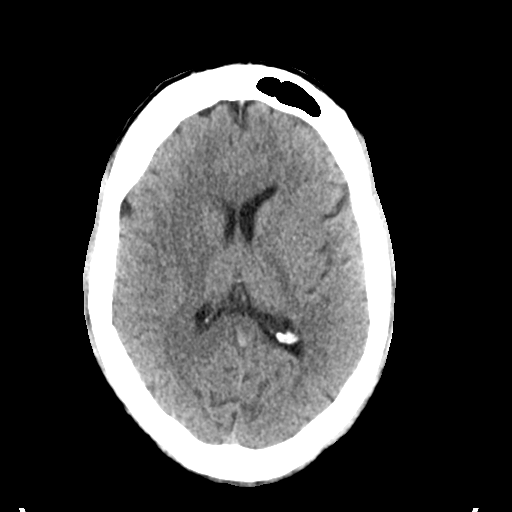
[im 17/33  bone]
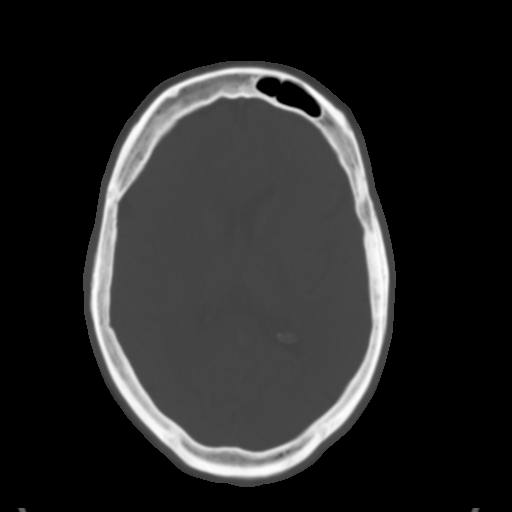
[im 20/33  brain]
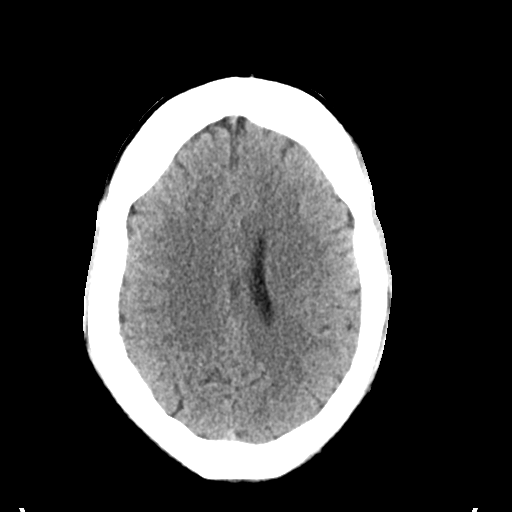
[im 24/33  brain]
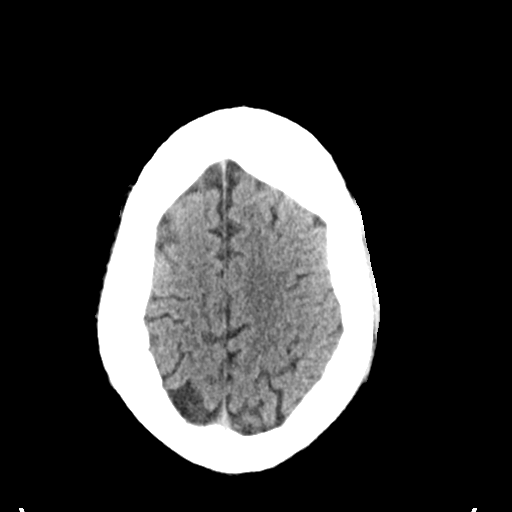
[im 27/33  brain]
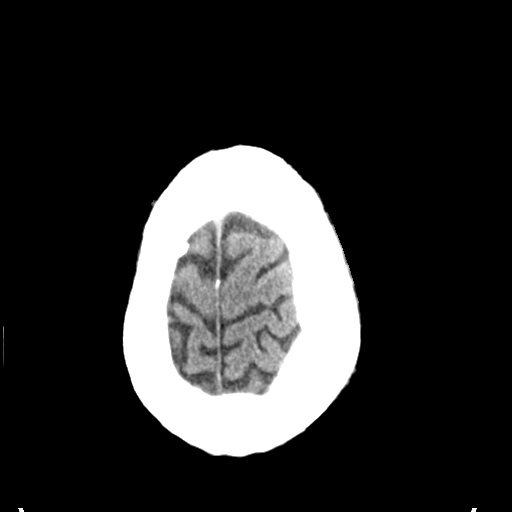
[im 30/33  brain]
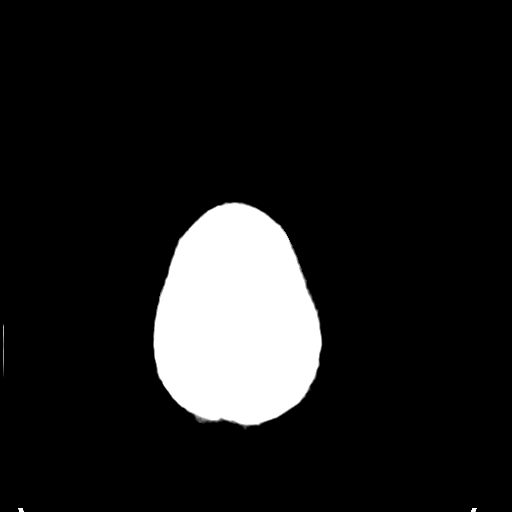
[im 30/33  bone]
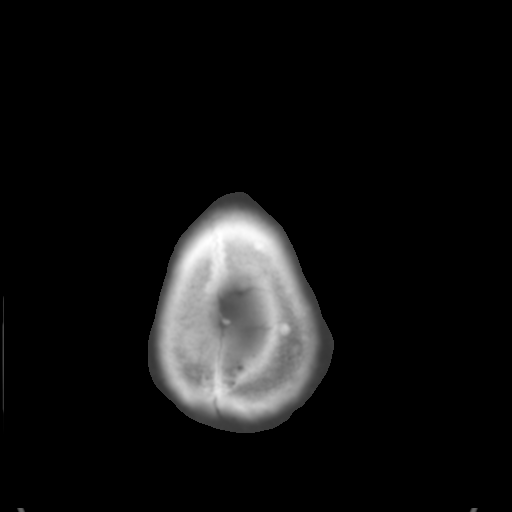

[Series 4: coronal soft tissue · coronal · 0.36mm/px · 3 of 75 slices shown]
[im 25/75  brain]
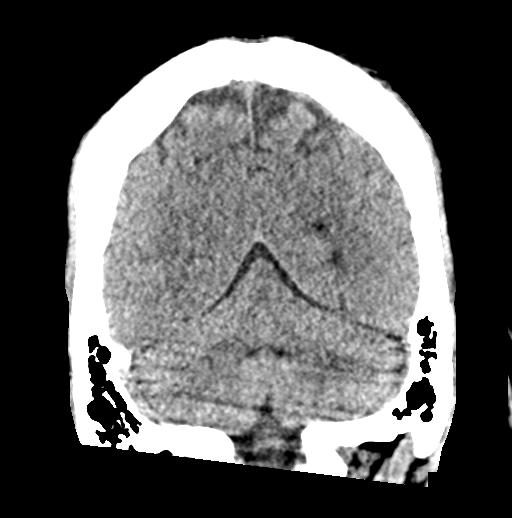
[im 33/75  brain]
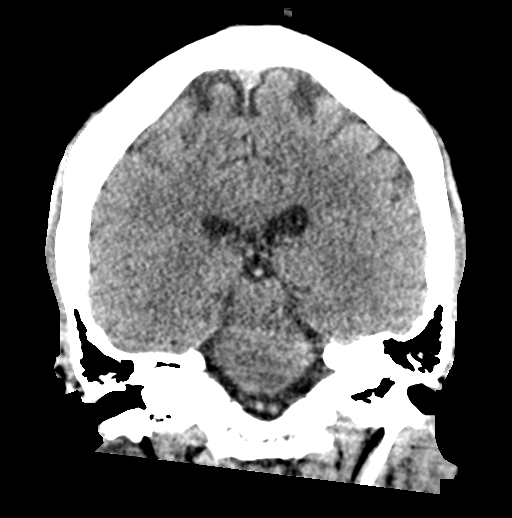
[im 42/75  brain]
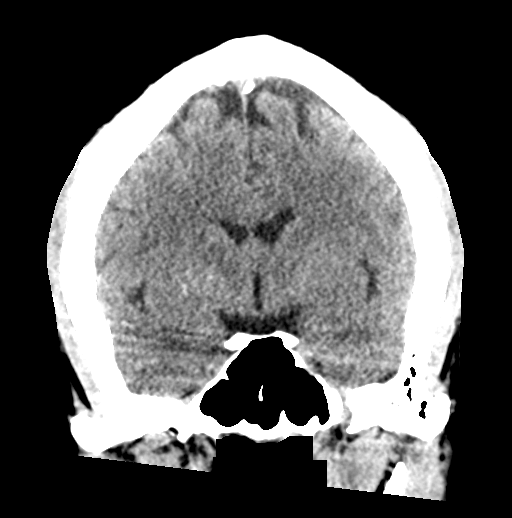

[Series 5: sagittal soft tissue · sagittal · 0.38mm/px · 3 of 60 slices shown]
[im 20/60  brain]
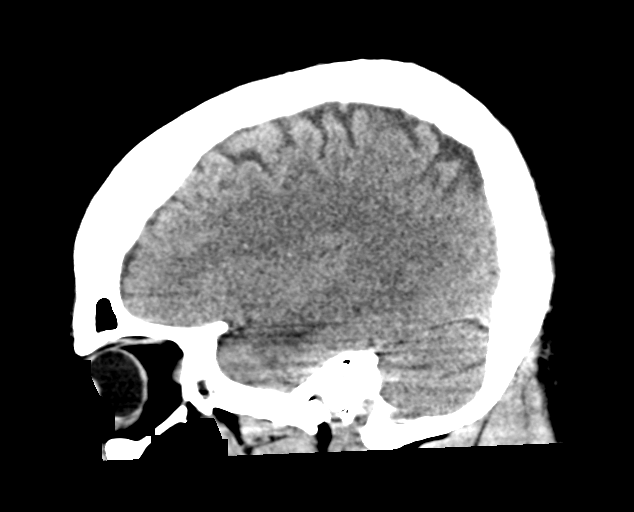
[im 30/60  brain]
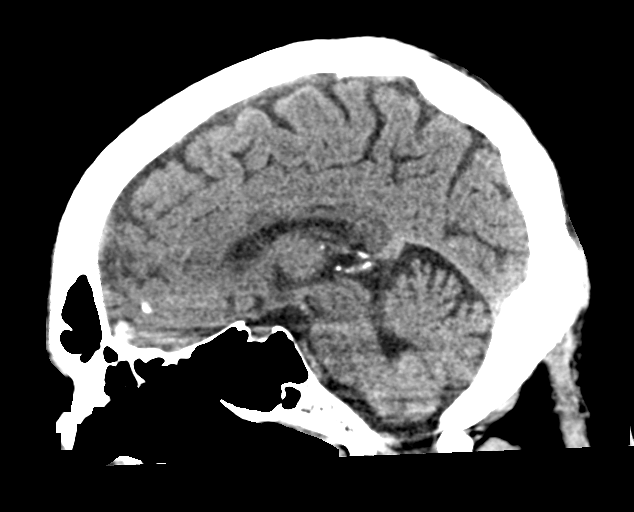
[im 40/60  brain]
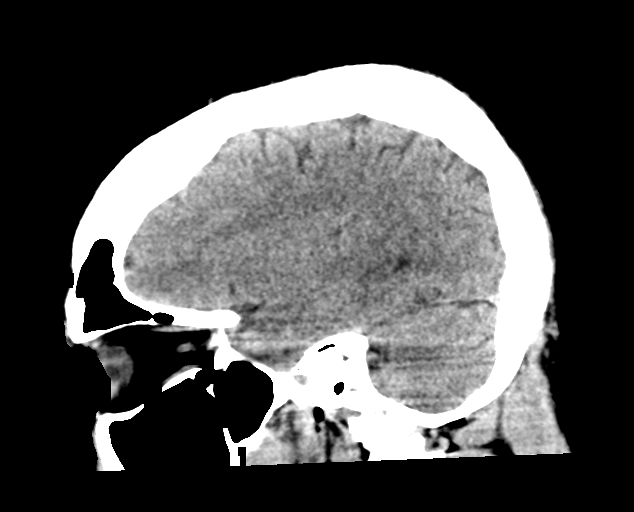

[15 of 47 positions shown; findings below may reference images not displayed]

FINDINGS: Brain: No intracranial hemorrhage mass effect or midline shift. No
acute cortical infarction. No hydrocephalus. No intra or extra-axial
fluid collection.

Vascular: No hyperdense vessel or unexpected calcification.

Skull: No skull fracture. Nonspecific sclerotic calvarial lesions
are noted bilateral parietal bone and right frontal bone. The
largest lesion in right frontal skull measures 1 cm. There is no
evidence of cortical destruction. This may represent multifocal
fibrous dysplasia. Metastatic disease cannot be entirely excluded.
Clinical correlation is necessary.

Sinuses/Orbits: No paranasal sinuses air-fluid levels. Minimal
mucosal thickening posterior aspect of the right maxillary sinus
lateral wall of the left maxillary sinus.

Other: None
IMPRESSION: No acute intracranial abnormality. Nonspecific sclerotic calvarial
lesions are noted the largest in right frontal skull 1 cm. No
evidence of cortical destruction or bone destruction. Multifocal
fibrous dysplasia cannot be excluded. Metastatic disease cannot be
entirely excluded. Clinical correlation is necessary minimal mucosal
thickening posterior aspect of the right maxillary sinus and lateral
wall of the left maxillary sinus.

These results were called by telephone at the time of interpretation
on 07/23/2016 at [DATE] to Dr. SOWDA ARFAN , who verbally
acknowledged these results.
